# Patient Record
Sex: Female | Born: 1945
Health system: Southern US, Community
[De-identification: ages and names within clinical notes are randomized; demographics above are authoritative.]

## PROBLEM LIST (undated history)

## (undated) DIAGNOSIS — L719 Rosacea, unspecified: Secondary | ICD-10-CM

## (undated) DIAGNOSIS — M549 Dorsalgia, unspecified: Secondary | ICD-10-CM

## (undated) DIAGNOSIS — M7918 Myalgia, other site: Secondary | ICD-10-CM

## (undated) DIAGNOSIS — K589 Irritable bowel syndrome without diarrhea: Secondary | ICD-10-CM

## (undated) DIAGNOSIS — E785 Hyperlipidemia, unspecified: Secondary | ICD-10-CM

## (undated) DIAGNOSIS — G43909 Migraine, unspecified, not intractable, without status migrainosus: Secondary | ICD-10-CM

## (undated) DIAGNOSIS — E039 Hypothyroidism, unspecified: Secondary | ICD-10-CM

## (undated) DIAGNOSIS — Z01419 Encounter for gynecological examination (general) (routine) without abnormal findings: Secondary | ICD-10-CM

## (undated) DIAGNOSIS — F329 Major depressive disorder, single episode, unspecified: Secondary | ICD-10-CM

## (undated) DIAGNOSIS — F5104 Psychophysiologic insomnia: Secondary | ICD-10-CM

## (undated) DIAGNOSIS — N951 Menopausal and female climacteric states: Secondary | ICD-10-CM

## (undated) DIAGNOSIS — N3090 Cystitis, unspecified without hematuria: Secondary | ICD-10-CM

## (undated) DIAGNOSIS — F32A Depression, unspecified: Secondary | ICD-10-CM

## (undated) DIAGNOSIS — K635 Polyp of colon: Secondary | ICD-10-CM

## (undated) DIAGNOSIS — F988 Other specified behavioral and emotional disorders with onset usually occurring in childhood and adolescence: Secondary | ICD-10-CM

## (undated) HISTORY — DX: Dorsalgia, unspecified: M54.9

## (undated) HISTORY — DX: Irritable bowel syndrome, unspecified: K58.9

## (undated) HISTORY — DX: Depression, unspecified: F32.A

## (undated) HISTORY — DX: Rosacea, unspecified: L71.9

## (undated) HISTORY — DX: Hyperlipidemia, unspecified: E78.5

## (undated) HISTORY — PX: UPPER GASTROINTESTINAL ENDOSCOPY: SHX188

## (undated) HISTORY — DX: Menopausal and female climacteric states: N95.1

## (undated) HISTORY — DX: Encounter for gynecological examination (general) (routine) without abnormal findings: Z01.419

## (undated) HISTORY — DX: Hypothyroidism, unspecified: E03.9

## (undated) HISTORY — DX: Psychophysiologic insomnia: F51.04

## (undated) HISTORY — PX: APPENDECTOMY: SHX54

## (undated) HISTORY — DX: Polyp of colon: K63.5

## (undated) HISTORY — DX: Other specified behavioral and emotional disorders with onset usually occurring in childhood and adolescence: F98.8

## (undated) HISTORY — PX: TONSILLECTOMY: SUR1361

## (undated) HISTORY — PX: ABDOMINAL HYSTERECTOMY: SHX81

## (undated) HISTORY — DX: Migraine, unspecified, not intractable, without status migrainosus: G43.909

## (undated) HISTORY — DX: Cystitis, unspecified without hematuria: N30.90

## (undated) HISTORY — DX: Myalgia, other site: M79.18

## (undated) HISTORY — PX: OVARIAN CYST REMOVAL: SHX89

## (undated) HISTORY — DX: Major depressive disorder, single episode, unspecified: F32.9

---

## 1999-03-05 ENCOUNTER — Ambulatory Visit (HOSPITAL_COMMUNITY): Admission: RE | Admit: 1999-03-05 | Discharge: 1999-03-05 | Payer: Self-pay | Admitting: Family Medicine

## 1999-03-05 ENCOUNTER — Encounter: Payer: Self-pay | Admitting: Family Medicine

## 1999-10-27 ENCOUNTER — Other Ambulatory Visit: Admission: RE | Admit: 1999-10-27 | Discharge: 1999-10-27 | Payer: Self-pay | Admitting: Obstetrics and Gynecology

## 2000-11-17 ENCOUNTER — Other Ambulatory Visit: Admission: RE | Admit: 2000-11-17 | Discharge: 2000-11-17 | Payer: Self-pay | Admitting: Obstetrics and Gynecology

## 2001-02-01 ENCOUNTER — Encounter: Payer: Self-pay | Admitting: *Deleted

## 2001-02-01 ENCOUNTER — Encounter: Admission: RE | Admit: 2001-02-01 | Discharge: 2001-02-01 | Payer: Self-pay | Admitting: *Deleted

## 2001-04-04 ENCOUNTER — Inpatient Hospital Stay (HOSPITAL_COMMUNITY): Admission: EM | Admit: 2001-04-04 | Discharge: 2001-04-06 | Payer: Self-pay | Admitting: Emergency Medicine

## 2001-11-22 ENCOUNTER — Other Ambulatory Visit: Admission: RE | Admit: 2001-11-22 | Discharge: 2001-11-22 | Payer: Self-pay | Admitting: Obstetrics and Gynecology

## 2002-03-30 ENCOUNTER — Other Ambulatory Visit: Admission: RE | Admit: 2002-03-30 | Discharge: 2002-03-30 | Payer: Self-pay | Admitting: Obstetrics and Gynecology

## 2002-12-03 ENCOUNTER — Other Ambulatory Visit: Admission: RE | Admit: 2002-12-03 | Discharge: 2002-12-03 | Payer: Self-pay | Admitting: Obstetrics and Gynecology

## 2004-08-28 ENCOUNTER — Ambulatory Visit: Payer: Self-pay | Admitting: Family Medicine

## 2004-09-08 ENCOUNTER — Ambulatory Visit: Payer: Self-pay | Admitting: Family Medicine

## 2004-09-19 ENCOUNTER — Encounter: Admission: RE | Admit: 2004-09-19 | Discharge: 2004-09-19 | Payer: Self-pay | Admitting: Orthopaedic Surgery

## 2004-10-09 ENCOUNTER — Encounter: Admission: RE | Admit: 2004-10-09 | Discharge: 2004-10-09 | Payer: Self-pay | Admitting: Orthopaedic Surgery

## 2004-12-02 ENCOUNTER — Ambulatory Visit: Payer: Self-pay | Admitting: Family Medicine

## 2004-12-30 ENCOUNTER — Ambulatory Visit: Payer: Self-pay | Admitting: Family Medicine

## 2005-05-12 ENCOUNTER — Ambulatory Visit: Payer: Self-pay | Admitting: Family Medicine

## 2005-09-08 ENCOUNTER — Ambulatory Visit: Payer: Self-pay | Admitting: Family Medicine

## 2005-09-15 ENCOUNTER — Ambulatory Visit: Payer: Self-pay | Admitting: Family Medicine

## 2005-12-01 ENCOUNTER — Ambulatory Visit: Payer: Self-pay | Admitting: Family Medicine

## 2005-12-14 ENCOUNTER — Ambulatory Visit: Payer: Self-pay | Admitting: Family Medicine

## 2006-05-17 ENCOUNTER — Ambulatory Visit: Payer: Self-pay | Admitting: Family Medicine

## 2006-08-11 ENCOUNTER — Telehealth: Payer: Self-pay | Admitting: Family Medicine

## 2006-09-06 ENCOUNTER — Encounter: Payer: Self-pay | Admitting: Family Medicine

## 2006-09-15 ENCOUNTER — Ambulatory Visit: Payer: Self-pay | Admitting: Family Medicine

## 2006-09-15 DIAGNOSIS — R51 Headache: Secondary | ICD-10-CM

## 2006-09-15 LAB — CONVERTED CEMR LAB
Bilirubin Urine: NEGATIVE
Blood in Urine, dipstick: NEGATIVE
Protein, U semiquant: NEGATIVE
Urobilinogen, UA: 0.2
WBC Urine, dipstick: NEGATIVE

## 2006-09-22 ENCOUNTER — Ambulatory Visit: Payer: Self-pay | Admitting: Family Medicine

## 2006-09-22 DIAGNOSIS — N951 Menopausal and female climacteric states: Secondary | ICD-10-CM

## 2006-09-22 DIAGNOSIS — R197 Diarrhea, unspecified: Secondary | ICD-10-CM

## 2006-09-22 DIAGNOSIS — G4700 Insomnia, unspecified: Secondary | ICD-10-CM | POA: Insufficient documentation

## 2006-09-22 DIAGNOSIS — F329 Major depressive disorder, single episode, unspecified: Secondary | ICD-10-CM

## 2006-09-22 LAB — CONVERTED CEMR LAB
AST: 21 units/L (ref 0–37)
Albumin: 3.8 g/dL (ref 3.5–5.2)
BUN: 17 mg/dL (ref 6–23)
Basophils Absolute: 0 10*3/uL (ref 0.0–0.1)
CO2: 31 meq/L (ref 19–32)
Chloride: 107 meq/L (ref 96–112)
Cholesterol: 198 mg/dL (ref 0–200)
Eosinophils Absolute: 0.3 10*3/uL (ref 0.0–0.6)
Eosinophils Relative: 5.7 % — ABNORMAL HIGH (ref 0.0–5.0)
GFR calc non Af Amer: 77 mL/min
Glucose, Bld: 97 mg/dL (ref 70–99)
Hemoglobin: 14 g/dL (ref 12.0–15.0)
LDL Cholesterol: 95 mg/dL (ref 0–99)
Lymphocytes Relative: 40.7 % (ref 12.0–46.0)
MCHC: 34.6 g/dL (ref 30.0–36.0)
MCV: 95.8 fL (ref 78.0–100.0)
Monocytes Absolute: 0.5 10*3/uL (ref 0.2–0.7)
Neutro Abs: 2.4 10*3/uL (ref 1.4–7.7)
Platelets: 252 10*3/uL (ref 150–400)
Potassium: 4.6 meq/L (ref 3.5–5.1)
RBC: 4.23 M/uL (ref 3.87–5.11)
RDW: 12.4 % (ref 11.5–14.6)
TSH: 0.03 microintl units/mL — ABNORMAL LOW (ref 0.35–5.50)
Total Bilirubin: 0.8 mg/dL (ref 0.3–1.2)
Total Protein: 6.5 g/dL (ref 6.0–8.3)

## 2006-09-23 ENCOUNTER — Encounter: Payer: Self-pay | Admitting: Family Medicine

## 2006-09-26 ENCOUNTER — Encounter: Admission: RE | Admit: 2006-09-26 | Discharge: 2006-09-26 | Payer: Self-pay | Admitting: Gastroenterology

## 2006-09-27 ENCOUNTER — Telehealth: Payer: Self-pay | Admitting: Family Medicine

## 2006-10-10 ENCOUNTER — Encounter: Payer: Self-pay | Admitting: Family Medicine

## 2006-10-13 ENCOUNTER — Telehealth (INDEPENDENT_AMBULATORY_CARE_PROVIDER_SITE_OTHER): Payer: Self-pay | Admitting: *Deleted

## 2006-10-27 ENCOUNTER — Telehealth: Payer: Self-pay | Admitting: Family Medicine

## 2006-12-19 ENCOUNTER — Encounter: Admission: RE | Admit: 2006-12-19 | Discharge: 2006-12-19 | Payer: Self-pay | Admitting: Orthopaedic Surgery

## 2006-12-21 ENCOUNTER — Encounter: Payer: Self-pay | Admitting: Family Medicine

## 2007-01-03 ENCOUNTER — Telehealth: Payer: Self-pay | Admitting: Family Medicine

## 2007-03-02 ENCOUNTER — Telehealth: Payer: Self-pay | Admitting: Family Medicine

## 2007-04-04 ENCOUNTER — Telehealth: Payer: Self-pay | Admitting: Family Medicine

## 2007-04-19 LAB — CONVERTED CEMR LAB: Pap Smear: NORMAL

## 2007-04-19 LAB — HM COLONOSCOPY

## 2007-04-24 ENCOUNTER — Telehealth: Payer: Self-pay | Admitting: Family Medicine

## 2007-06-06 ENCOUNTER — Encounter: Admission: RE | Admit: 2007-06-06 | Discharge: 2007-06-06 | Payer: Self-pay | Admitting: Gastroenterology

## 2007-07-11 ENCOUNTER — Telehealth: Payer: Self-pay | Admitting: Family Medicine

## 2007-09-19 ENCOUNTER — Ambulatory Visit: Payer: Self-pay | Admitting: Family Medicine

## 2007-09-19 LAB — CONVERTED CEMR LAB
Glucose, Urine, Semiquant: NEGATIVE
Nitrite: NEGATIVE
Protein, U semiquant: NEGATIVE
Urobilinogen, UA: 0.2
WBC Urine, dipstick: NEGATIVE
pH: 6.5

## 2007-09-26 ENCOUNTER — Ambulatory Visit: Payer: Self-pay | Admitting: Family Medicine

## 2007-09-26 DIAGNOSIS — L719 Rosacea, unspecified: Secondary | ICD-10-CM

## 2007-09-26 DIAGNOSIS — J309 Allergic rhinitis, unspecified: Secondary | ICD-10-CM | POA: Insufficient documentation

## 2007-09-26 DIAGNOSIS — M810 Age-related osteoporosis without current pathological fracture: Secondary | ICD-10-CM | POA: Insufficient documentation

## 2007-09-26 DIAGNOSIS — G43909 Migraine, unspecified, not intractable, without status migrainosus: Secondary | ICD-10-CM | POA: Insufficient documentation

## 2007-09-26 LAB — CONVERTED CEMR LAB
AST: 19 units/L (ref 0–37)
Alkaline Phosphatase: 93 units/L (ref 39–117)
BUN: 12 mg/dL (ref 6–23)
Bilirubin, Direct: 0.1 mg/dL (ref 0.0–0.3)
Direct LDL: 102.7 mg/dL
Eosinophils Absolute: 0.1 10*3/uL (ref 0.0–0.7)
Eosinophils Relative: 2.1 % (ref 0.0–5.0)
GFR calc Af Amer: 93 mL/min
GFR calc non Af Amer: 77 mL/min
HCT: 41.2 % (ref 36.0–46.0)
HDL: 52.2 mg/dL (ref >39.0)
MCHC: 34.5 g/dL (ref 30.0–36.0)
Monocytes Absolute: 0.3 10*3/uL (ref 0.1–1.0)
Neutrophils Relative %: 40.8 % — ABNORMAL LOW (ref 43.0–77.0)
Platelets: 280 10*3/uL (ref 150–400)
Potassium: 4.6 meq/L (ref 3.5–5.1)
RBC: 4.26 M/uL (ref 3.87–5.11)
Sodium: 146 meq/L — ABNORMAL HIGH (ref 135–145)
Total Bilirubin: 0.7 mg/dL (ref 0.3–1.2)
Triglycerides: 109 mg/dL (ref 0–149)

## 2007-10-24 ENCOUNTER — Ambulatory Visit: Payer: Self-pay | Admitting: Family Medicine

## 2007-11-08 ENCOUNTER — Telehealth: Payer: Self-pay | Admitting: Family Medicine

## 2008-01-09 ENCOUNTER — Telehealth: Payer: Self-pay | Admitting: Family Medicine

## 2008-01-18 ENCOUNTER — Ambulatory Visit (HOSPITAL_BASED_OUTPATIENT_CLINIC_OR_DEPARTMENT_OTHER): Admission: RE | Admit: 2008-01-18 | Discharge: 2008-01-18 | Payer: Self-pay | Admitting: Orthopaedic Surgery

## 2008-04-16 ENCOUNTER — Ambulatory Visit: Payer: Self-pay | Admitting: Family Medicine

## 2008-04-16 DIAGNOSIS — M129 Arthropathy, unspecified: Secondary | ICD-10-CM | POA: Insufficient documentation

## 2008-04-29 ENCOUNTER — Encounter: Payer: Self-pay | Admitting: Family Medicine

## 2008-06-20 ENCOUNTER — Ambulatory Visit: Payer: Self-pay | Admitting: Family Medicine

## 2008-06-20 DIAGNOSIS — F411 Generalized anxiety disorder: Secondary | ICD-10-CM

## 2008-10-29 ENCOUNTER — Ambulatory Visit: Payer: Self-pay | Admitting: Family Medicine

## 2008-10-29 DIAGNOSIS — E039 Hypothyroidism, unspecified: Secondary | ICD-10-CM | POA: Insufficient documentation

## 2008-10-29 LAB — CONVERTED CEMR LAB
Glucose, Urine, Semiquant: NEGATIVE
Ketones, urine, test strip: NEGATIVE
Nitrite: NEGATIVE
Specific Gravity, Urine: 1.015
Urobilinogen, UA: 0.2
WBC Urine, dipstick: NEGATIVE
pH: 8.5

## 2008-11-12 LAB — CONVERTED CEMR LAB
AST: 18 units/L (ref 0–37)
Albumin: 4.1 g/dL (ref 3.5–5.2)
BUN: 19 mg/dL (ref 6–23)
CO2: 29 meq/L (ref 19–32)
Calcium: 9 mg/dL (ref 8.4–10.5)
Chloride: 102 meq/L (ref 96–112)
Eosinophils Absolute: 0.1 10*3/uL (ref 0.0–0.7)
Glucose, Bld: 85 mg/dL (ref 70–99)
HCT: 42.6 % (ref 36.0–46.0)
HDL: 66.3 mg/dL (ref >39.00)
Lymphs Abs: 2 10*3/uL (ref 0.7–4.0)
MCHC: 34 g/dL (ref 30.0–36.0)
MCV: 98.9 fL (ref 78.0–100.0)
Monocytes Relative: 7.4 % (ref 3.0–12.0)
Neutro Abs: 1.9 10*3/uL (ref 1.4–7.7)
Platelets: 204 10*3/uL (ref 150.0–400.0)
RBC: 4.31 M/uL (ref 3.87–5.11)
RDW: 12.5 % (ref 11.5–14.6)
Sodium: 142 meq/L (ref 135–145)

## 2008-11-13 ENCOUNTER — Telehealth: Payer: Self-pay | Admitting: Family Medicine

## 2009-01-29 ENCOUNTER — Telehealth: Payer: Self-pay | Admitting: Family Medicine

## 2009-02-26 ENCOUNTER — Telehealth: Payer: Self-pay | Admitting: Family Medicine

## 2009-04-29 ENCOUNTER — Telehealth: Payer: Self-pay | Admitting: Family Medicine

## 2009-05-01 ENCOUNTER — Ambulatory Visit: Payer: Self-pay | Admitting: Family Medicine

## 2009-08-05 ENCOUNTER — Telehealth: Payer: Self-pay | Admitting: Family Medicine

## 2009-10-10 ENCOUNTER — Encounter: Payer: Self-pay | Admitting: Family Medicine

## 2009-10-10 ENCOUNTER — Telehealth (INDEPENDENT_AMBULATORY_CARE_PROVIDER_SITE_OTHER): Payer: Self-pay | Admitting: *Deleted

## 2009-10-18 ENCOUNTER — Ambulatory Visit (HOSPITAL_BASED_OUTPATIENT_CLINIC_OR_DEPARTMENT_OTHER): Admission: RE | Admit: 2009-10-18 | Discharge: 2009-10-18 | Payer: Self-pay | Admitting: Rheumatology

## 2009-10-18 ENCOUNTER — Ambulatory Visit: Payer: Self-pay | Admitting: Diagnostic Radiology

## 2009-11-12 ENCOUNTER — Telehealth: Payer: Self-pay | Admitting: Family Medicine

## 2009-11-25 ENCOUNTER — Ambulatory Visit: Payer: Self-pay | Admitting: Family Medicine

## 2009-11-25 ENCOUNTER — Encounter: Payer: Self-pay | Admitting: Family Medicine

## 2009-11-25 DIAGNOSIS — K589 Irritable bowel syndrome without diarrhea: Secondary | ICD-10-CM

## 2009-11-25 DIAGNOSIS — E782 Mixed hyperlipidemia: Secondary | ICD-10-CM

## 2009-11-25 DIAGNOSIS — D72819 Decreased white blood cell count, unspecified: Secondary | ICD-10-CM | POA: Insufficient documentation

## 2009-11-26 LAB — CONVERTED CEMR LAB
AST: 19 units/L (ref 0–37)
Alkaline Phosphatase: 61 units/L (ref 39–117)
BUN: 18 mg/dL (ref 6–23)
CO2: 30 meq/L (ref 19–32)
Eosinophils Absolute: 0.1 10*3/uL (ref 0.0–0.7)
GFR calc non Af Amer: 82.11 mL/min (ref >60)
Hemoglobin: 13.7 g/dL (ref 12.0–15.0)
Monocytes Absolute: 0.3 10*3/uL (ref 0.1–1.0)
Neutrophils Relative %: 45.3 % (ref 43.0–77.0)
Potassium: 5.1 meq/L (ref 3.5–5.1)
RBC: 4.16 M/uL (ref 3.87–5.11)
RDW: 13.1 % (ref 11.5–14.6)
Sodium: 142 meq/L (ref 135–145)
Total Bilirubin: 0.5 mg/dL (ref 0.3–1.2)
Total CHOL/HDL Ratio: 4
VLDL: 30 mg/dL (ref 0.0–40.0)
WBC: 4.1 10*3/uL — ABNORMAL LOW (ref 4.5–10.5)

## 2009-11-28 ENCOUNTER — Encounter (HOSPITAL_COMMUNITY)
Admission: RE | Admit: 2009-11-28 | Discharge: 2010-01-17 | Payer: Self-pay | Source: Home / Self Care | Attending: Rheumatology | Admitting: Rheumatology

## 2010-02-17 NOTE — Progress Notes (Signed)
Summary: refill   Phone Note Refill Request Call back at Home Phone [phone] Message from:  Patient---live call  Refills Requested: Medication #1:  SONATA 10 MG  CAPS 2 tabs at bedtime send to [facility]. her cpx with Dr Abner Greenspan is next week. she is out of meds. please send today.  Initial call taken by: Warnell Forester,  November 12, 2009 1:39 PM  Follow-up for Phone Call        ok to send with same sig 180#, no rf Follow-up by: Danise Edge MD,  November 12, 2009 7:24 PM    Prescriptions: SONATA 10 MG  CAPS (ZALEPLON) 2 tabs at bedtime  #180 x 0   Entered by:   Josph Macho RMA   Authorized by:   Danise Edge MD   Signed by:   Josph Macho RMA on 11/13/2009   Method used:   Telephoned to ...       [facility]  [address] [id number] ([facility]ail)       [address]       [place], [place]  [address]       Ph: [phone] or [phone]       Fax: [phone]   RxID:   [id number]

## 2010-02-17 NOTE — Letter (Signed)
Summary: [facility]  [facility]   Imported By: Maryln Gottron 10/23/2009 14:03:29  _____________________________________________________________________  External Attachment:    Type:   Image     Comment:   External Document

## 2010-02-17 NOTE — Progress Notes (Signed)
Summary: new rx  GENERIC RITALIN   Phone Note Call from Patient Call back at Home Phone [phone]   Caller: Patient Call For: dr Scotty Court Summary of Call: pt would like rx for generic  ritalin 20 mg one tablet three a day. please call when ready Initial call taken by: Heron Sabins,  January 09, 2008 12:55 PM  Follow-up for Phone Call        OK  CAN PICK UP TOMORROW BEFORE 12 NOON  Follow-up by: Pura Spice, RN,  January 10, 2008 10:24 AM    New/Updated Medications: METHYLPHENIDATE HCL 20 MG  TABS (METHYLPHENIDATE HCL) TAKE 1 by mouth three times a day  FILL ON JAN 2,2010 METHYLPHENIDATE HCL 20 MG  TABS (METHYLPHENIDATE HCL) 1 by mouth three times a day fill on Feb 20 2008 METHYLPHENIDATE HCL 20 MG  TABS (METHYLPHENIDATE HCL) 1 by mouth three times a day fill March 19, 2008   Prescriptions: METHYLPHENIDATE HCL 20 MG  TABS (METHYLPHENIDATE HCL) 1 by mouth three times a day fill March 19, 2008  #90 x 0   Entered by:   Pura Spice, RN   Authorized by:   Judithann Sheen MD   Signed by:   Pura Spice, RN on 01/10/2008   Method used:   Print then Give to Patient   RxID:   [id number] METHYLPHENIDATE HCL 20 MG  TABS (METHYLPHENIDATE HCL) 1 by mouth three times a day fill on Feb 20 2008  #90 x 0   Entered by:   Pura Spice, RN   Authorized by:   Judithann Sheen MD   Signed by:   Pura Spice, RN on 01/10/2008   Method used:   Print then Give to Patient   RxID:   [id number] METHYLPHENIDATE HCL 20 MG  TABS (METHYLPHENIDATE HCL) TAKE 1 by mouth three times a day  FILL ON JAN 2,2010  #90 x 0   Entered by:   Pura Spice, RN   Authorized by:   Judithann Sheen MD   Signed by:   Pura Spice, RN on 01/10/2008   Method used:   Print then Give to Patient   RxID:   [id number]

## 2010-02-17 NOTE — Progress Notes (Signed)
Summary: REFILL REQUEST  Phone Note Refill Request Message from:  Patient on October 10, 2009 10:57 AM  Refills Requested: Medication #1:  METHYLPHENIDATE HCL 20 MG  TABS TAKE 1 by mouth three times a day  FILL ON  july20   Notes: Pt can be reached at [phone] when Rx is ready for p/u.    Initial call taken by: Debbra Riding,  October 10, 2009 10:58 AM  Follow-up for Phone Call        Pt had RX wrote for dispense on July 20, Aug 20, and Sept 20. Pt was informed to call back next month. Follow-up by: Josph Macho RMA,  October 10, 2009 11:07 AM

## 2010-02-17 NOTE — Progress Notes (Signed)
Summary: new rx and referral  Phone Note Call from Patient Call back at Home Phone [phone]   Caller: Patient Call For: Judithann Sheen MD Summary of Call: pt would like referral to rheumatologist dr zieminski or dr deveshwar for ?fibromyalgia also new rx ritalin 20mg  for 3 months and separate rx for generic allergra 180 #100 with 3 refills pt will get rx fill in [place] Initial call taken by: Heron Sabins,  August 05, 2009 12:27 PM  Follow-up for Phone Call        ok can pick up rx today and [facility] will call when they get referrral completed,  Follow-up by: Pura Spice, RN,  August 06, 2009 8:30 AM    New/Updated Medications: ALLEGRA 180 MG  TABS (FEXOFENADINE HCL) once daily METHYLPHENIDATE HCL 20 MG  TABS (METHYLPHENIDATE HCL) TAKE 1 by mouth three times a day  FILL ON  july20, 2011 METHYLPHENIDATE HCL 20 MG  TABS (METHYLPHENIDATE HCL) 1 by mouth three times a day fill on    Sep 06 2009 METHYLPHENIDATE HCL 20 MG  TABS (METHYLPHENIDATE HCL) 1 by mouth three times a day fill sept 20 2011 Prescriptions: METHYLPHENIDATE HCL 20 MG  TABS (METHYLPHENIDATE HCL) 1 by mouth three times a day fill sept 20 2011  #90 x 0   Entered by:   Pura Spice, RN   Authorized by:   Judithann Sheen MD   Signed by:   Pura Spice, RN on 08/06/2009   Method used:   Print then Give to Patient   RxID:   [id number] METHYLPHENIDATE HCL 20 MG  TABS (METHYLPHENIDATE HCL) 1 by mouth three times a day fill on    Sep 06 2009  #90 x 0   Entered by:   Pura Spice, RN   Authorized by:   Judithann Sheen MD   Signed by:   Pura Spice, RN on 08/06/2009   Method used:   Print then Give to Patient   RxID:   [id number] METHYLPHENIDATE HCL 20 MG  TABS (METHYLPHENIDATE HCL) TAKE 1 by mouth three times a day  FILL ON  july20, 2011  #90 x 0   Entered by:   Pura Spice, RN   Authorized by:   Judithann Sheen MD   Signed by:   Pura Spice, RN on 08/06/2009   Method used:    Print then Give to Patient   RxID:   [id number] ALLEGRA 180 MG  TABS (FEXOFENADINE HCL) once daily  #100 x 3   Entered by:   Pura Spice, RN   Authorized by:   Judithann Sheen MD   Signed by:   Pura Spice, RN on 08/06/2009   Method used:   Print then Give to Patient   RxID:   [id number]

## 2010-02-17 NOTE — Assessment & Plan Note (Signed)
Summary: MED CK (REFILL) // RS---PT REQ APPT AFTER LUNCH // RS   Vital Signs:  Patient profile:   65 year old female Weight:      124 pounds O2 Sat:      97 % Temp:     97.8 degrees F Pulse rate:   88 / minute BP sitting:   110 / 74  (left arm)  Vitals Entered By: Pura Spice, RN (May 01, 2009 1:21 PM) CC: 6 month follow up and refills    History of Present Illness: pT IN TO DISCUSS ALL ASPECTS OF DEPRESSION AND HER TX. fOUND RITALIN IS EXCELLANT ANTIDEPRESSANT FOR HER, DOING BETTER THEN EVER STOPPED ESTROGENS AND BECAME MORE DEPRESSED AND FATIQUED, RESTARTED AND FUNCTIONED MUCH BETTER, vAGIFEN AND ESTRADIOL HELPS ATROPHIC VAGINITIS TREMENDOUSLY SONATO AND LOPRRESSOR CONTROLS HER CHRONIC INSOMNIA MIGRAINES ESSENTIALLY SAME, CONTINUES TO SEE dR FRIEDMAN, NEUROLOGIST  Allergies: 1)  ! Keflex  Past History:  Past Medical History: Last updated: 10/29/2008 IBS Back pain Headache,migraine Chronic insomnia Recurrent cystitis after intercourse, controlled with Macrobid  Review of Systems  The patient denies anorexia, fever, weight loss, weight gain, vision loss, decreased hearing, hoarseness, chest pain, syncope, dyspnea on exertion, peripheral edema, prolonged cough, headaches, hemoptysis, abdominal pain, melena, hematochezia, severe indigestion/heartburn, hematuria, incontinence, genital sores, muscle weakness, suspicious skin lesions, transient blindness, difficulty walking, depression, unusual weight change, abnormal bleeding, enlarged lymph nodes, angioedema, breast masses, and testicular masses.    Physical Exam  General:  Well-developed,well-nourished,in no acute distress; alert,appropriate and cooperative throughout examination Lungs:  Normal respiratory effort, chest expands symmetrically. Lungs are clear to auscultation, no crackles or wheezes. Heart:  Normal rate and regular rhythm. S1 and S2 normal without gallop, murmur, click, rub or other extra sounds. Abdomen:   Bowel sounds positive,abdomen soft and non-tender without masses, organomegaly or hernias noted. Msk:  No deformity or scoliosis noted of thoracic or lumbar spine.     Impression & Recommendations:  Problem # 1:  ANXIETY (ICD-300.00) Assessment Improved  The following medications were removed from the medication list:    Effexor 37.5 Mg Tabs (Venlafaxine hcl) .Marland KitchenMarland KitchenMarland KitchenMarland Kitchen 3 tabs once daily Her updated medication list for this problem includes:    Lorazepam 2 Mg Tabs (Lorazepam) .Marland Kitchen... 1 three times a day as needed stress    Zoloft 100 Mg Tabs (Sertraline hcl) .Marland Kitchen... 1/2 tab daily  Problem # 2:  MIGRAINE, CHRONIC (ICD-346.90) Assessment: Unchanged  Her updated medication list for this problem includes:    Maxalt-mlt 10 Mg Tbdp (Rizatriptan benzoate) .Marland Kitchen... As needed. maximium 4 per week dr Neale Burly    Ecotrin Low Strength 81 Mg Tbec (Aspirin) ..... Once daily    Celebrex 200 Mg Caps (Celecoxib) .Marland Kitchen..Marland Kitchen Two times a day  Problem # 3:  DEPRESSION, HX OF (ICD-V11.8) Assessment: Improved discontined effexor and taking ritalin as antidepressant, unable to take SSRI  Problem # 4:  INSOMNIA, CHRONIC (ICD-307.42) Assessment: Improved sonATO AND LORAZEPAM  Problem # 5:  POSTMENOPAUSAL STATUS (ICD-627.2) Assessment: Improved  The following medications were removed from the medication list:    Vivelle-dot 0.05 Mg/24hr Pttw (Estradiol) .Marland Kitchen... 2xweek [facility] Her updated medication list for this problem includes:    Vagifem 25 Mcg Tabs (Estradiol) .Marland Kitchen... 2 x weekly    Estradiol 0.5 Mg Tabs (Estradiol) ..... Once daily ATTEMPTED TO STOP ESTROGENS AND DID HORRIBLY  Complete Medication List: 1)  Synthroid 137 Mcg Tabs (Levothyroxine sodium) .... Once daily dr doerr 2)  Sonata 10 Mg Caps (Zaleplon) .... 2  tabs at bedtime 3)  Melatonin 3 Mg Caps (Melatonin) .... 2 at bedtime 4)  Allegra 180 Mg Tabs (Fexofenadine hcl) .... Once daily 5)  Maxalt-mlt 10 Mg Tbdp (Rizatriptan benzoate) .... As needed.  maximium 4 per week dr Neale Burly 6)  Ecotrin Low Strength 81 Mg Tbec (Aspirin) .... Once daily 7)  Vagifem 25 Mcg Tabs (Estradiol) .... 2 x weekly 8)  Celebrex 200 Mg Caps (Celecoxib) .... Two times a day 9)  Macrobid 100 Mg Caps (Nitrofurantoin monohyd macro) .... Two times a day 10)  Metronidazole 0.75 % Crea (Metronidazole) .... Apply to face two times a day 11)  Methylphenidate Hcl 20 Mg Tabs (Methylphenidate hcl) .... Take 1 by mouth three times a day  fill on April 29, 2009 12)  Methylphenidate Hcl 20 Mg Tabs (Methylphenidate hcl) .Marland Kitchen.. 1 by mouth three times a day fill on    May 29, 2009 13)  Methylphenidate Hcl 20 Mg Tabs (Methylphenidate hcl) .Marland Kitchen.. 1 by mouth three times a day fill  june 12,2011 14)  Lorazepam 2 Mg Tabs (Lorazepam) .Marland Kitchen.. 1 three times a day as needed stress 15)  Topamax 50 Mg Tabs (Topiramate) .... 3 tabs  once daily per dr. Neale Burly 16)  Perphenazine 4 Mg Tabs (Perphenazine) .... Dr Neale Burly 17)  Zoloft 100 Mg Tabs (Sertraline hcl) .... 1/2 tab daily 18)  Nystatin 500000 Unit Tabs (Nystatin) .Marland Kitchen.. 1 two times a day for yeast 19)  Estradiol 0.5 Mg Tabs (Estradiol) .... Once daily  Patient Instructions: 1)  discussed depression and tx, uses ritalin as antidepressan 2)  refilled medications 3)  return after Sept 12  for exam Prescriptions: METHYLPHENIDATE HCL 20 MG  TABS (METHYLPHENIDATE HCL) 1 by mouth three times a day fill  june 12,2011  #90 x 0   Entered and Authorized by:   Judithann Sheen MD   Signed by:   Judithann Sheen MD on 05/01/2009   Method used:   Print then Give to Patient   RxID:   [id number] METHYLPHENIDATE HCL 20 MG  TABS (METHYLPHENIDATE HCL) 1 by mouth three times a day fill on    May 29, 2009  #90 x 0   Entered and Authorized by:   Judithann Sheen MD   Signed by:   Judithann Sheen MD on 05/01/2009   Method used:   Print then Give to Patient   RxID:   [id number] METHYLPHENIDATE HCL 20 MG  TABS (METHYLPHENIDATE HCL)  TAKE 1 by mouth three times a day  FILL ON April 29, 2009  #90 x 0   Entered and Authorized by:   Judithann Sheen MD   Signed by:   Judithann Sheen MD on 05/01/2009   Method used:   Print then Give to Patient   RxID:   [id number] MACROBID 100 MG  CAPS (NITROFURANTOIN MONOHYD MACRO) two times a day  #30 x 11   Entered and Authorized by:   Judithann Sheen MD   Signed by:   Judithann Sheen MD on 05/01/2009   Method used:   Print then Give to Patient   RxID:   [id number] LORAZEPAM 2 MG TABS (LORAZEPAM) 1 three times a day as needed stress  #90 x 5   Entered and Authorized by:   Judithann Sheen MD   Signed by:   Judithann Sheen MD on 05/01/2009   Method used:   Print then Give  to Patient   RxID:   [id number] SONATA 10 MG  CAPS (ZALEPLON) 2 tabs at bedtime  #180 x 5   Entered and Authorized by:   Judithann Sheen MD   Signed by:   Judithann Sheen MD on 05/01/2009   Method used:   Print then Give to Patient   RxID:   [id number]

## 2010-02-17 NOTE — Progress Notes (Signed)
Summary: med refill  Phone Note Call from Patient   Caller: Patient Call For: Judithann Sheen MD Summary of Call: Pt needs written pres for Fexofenadine 180 mg. #90 plus 3 refills, and will pick up.  Would like this today.  Call when ready. [phone] Initial call taken by: Lynann Beaver CMA,  February 26, 2009 11:27 AM  Follow-up for Phone Call        tell her her rx for fexofenadine was given to her on oct  12 for 90 and 3 refills and this will last til Oct 29 2009 ?? thanks  Follow-up by: Pura Spice, RN,  February 26, 2009 12:54 PM  Additional Follow-up for Phone Call Additional follow up Details #1::        she is changing pharmacies, and needs new one. Additional Follow-up by: Lynann Beaver CMA,  February 26, 2009 1:01 PM    Additional Follow-up for Phone Call Additional follow up Details #2::    pt will contact pharmacy since has refills and will have her refills transferred to new company.  Follow-up by: Pura Spice, RN,  February 26, 2009 1:28 PM

## 2010-02-17 NOTE — Assessment & Plan Note (Signed)
Summary: CPX (PT WILL COME IN FASTING) // RS---PT RSC (BMP) // RS/pt r...   Vital Signs:  Patient profile:   65 year old female Height:      64 inches (162.56 cm) Weight:      126 pounds (57.27 kg) BMI:     21.71 O2 Sat:      99 % on Room air Temp:     97.7 degrees F (36.50 degrees C) oral Pulse rate:   105 / minute BP sitting:   116 / 70  (left arm)  Vitals Entered By: Josph Macho RMA (November 25, 2009 9:15 AM)  O2 Flow:  Room air CC: Physical/ Medicare welcome/ CF Is Patient Diabetic? No   History of Present Illness: 65 year old Caucasian female in today for annual Medicare exam generally doing well but does offer some concern she reports she has been struggling with left shoulder pain since last May. She reports she was sitting for 2 days working puzzle and ended up with a spasm in her left posterior shoulder and since then has been in chronic pain has seen orthopedics multiple studies done is using Lidoderm patches and gets very minimal relief. Chest is a complete bone scan ordered for the near future for more thorough evaluation due to level of pain. At this point she's also developed frozen shoulder unable to raise her arm above her head. Denies radicular symptoms down her arm, no numbness tingling or weakness. She denies any falls or traumas. She reports she is getting around her home well her activities of daily living are fine other than her limitations with her shoulder. She does not have any concerns about her home environment. She pays her bills regularly is able to cook and clean for her husband and herself still is not having trouble getting lost. No recent changes in her vision but does note some mild recent decrease in her hearing on the left worse than right. Denies tinnitus and says this loss is very minimal and does not affect her daily living she continues to show with a long history of depression anxiety and insomnia and reports Ritalin 3 times a day to help her energy  level during the day has been very helpful and she feels improved her mood and the Sonata at bedtime with the lorazepam helps her to sleep. She reports 2 siblings a brother and a sister both trouble with low serotonin levels that she does and so far her current regimen is normal and she's found that helpful. She was stripping with severe and chronic migraines now sees the headache clinic and has had good control on her current medications. She denies fevers, chills, chest pain, GI or GU complaints. Spelled world backward easily is going to person place and time today does not have a DNR order  Preventive Screening-Counseling & Management  Alcohol-Tobacco     Smoking Status: never      Drug Use:  no.    Problems Prior to Update: 1)  Leukopenia, Mild  (ICD-288.50) 2)  Mixed Hyperlipidemia  (ICD-272.2) 3)  Irritable Bowel Syndrome  (ICD-564.1) 4)  Hypothyroidism  (ICD-244.9) 5)  Uti  (ICD-599.0) 6)  Anxiety  (ICD-300.00) 7)  Arthritis  (ICD-716.90) 8)  Allergic Rhinitis  (ICD-477.9) 9)  Rosacea  (ICD-695.3) 10)  Migraine, Chronic  (ICD-346.90) 11)  Osteoporosis  (ICD-733.00) 12)  Depression, Hx of  (ICD-V11.8) 13)  Insomnia, Chronic  (ICD-307.42) 14)  Diarrhea, Acute, Chronic  (ICD-787.91) 15)  Examination, Routine Medical  (ICD-V70.0) 16)  Urinary Tract Infection, Chronic  (ICD-599.0) 17)  Postmenopausal Status  (ICD-627.2) 18)  Headache  (ICD-784.0)  Current Problems (verified): 1)  Leukopenia, Mild  (ICD-288.50) 2)  Mixed Hyperlipidemia  (ICD-272.2) 3)  Irritable Bowel Syndrome  (ICD-564.1) 4)  Hypothyroidism  (ICD-244.9) 5)  Uti  (ICD-599.0) 6)  Anxiety  (ICD-300.00) 7)  Arthritis  (ICD-716.90) 8)  Allergic Rhinitis  (ICD-477.9) 9)  Rosacea  (ICD-695.3) 10)  Migraine, Chronic  (ICD-346.90) 11)  Osteoporosis  (ICD-733.00) 12)  Depression, Hx of  (ICD-V11.8) 13)  Insomnia, Chronic  (ICD-307.42) 14)  Diarrhea, Acute, Chronic  (ICD-787.91) 15)  Examination, Routine Medical   (ICD-V70.0) 16)  Urinary Tract Infection, Chronic  (ICD-599.0) 17)  Postmenopausal Status  (ICD-627.2) 18)  Headache  (ICD-784.0)  Current Medications (verified): 1)  Synthroid 100 Mcg Tabs (Levothyroxine Sodium) .... Once Daily Dr Roanna Raider 2)  Sonata 10 Mg  Caps (Zaleplon) .... 2 Tabs At Bedtime 3)  Melatonin 3 Mg  Caps (Melatonin) .... 3 At Bedtime 4)  Allegra 180 Mg  Tabs (Fexofenadine Hcl) .... Once Daily 5)  Maxalt-Mlt 10 Mg  Tbdp (Rizatriptan Benzoate) .... As Needed. Maximium 4 Per Week Dr Neale Burly 6)  Ecotrin Low Strength 81 Mg  Tbec (Aspirin) .... Once Daily 7)  Vagifem 25 Mcg  Tabs (Estradiol) .... 2 X Weekly 8)  Celebrex 200 Mg  Caps (Celecoxib) .... Two Times A Day 9)  Macrobid 100 Mg  Caps (Nitrofurantoin Monohyd Macro) .... Two Times A Day 10)  Metronidazole 0.75 %  Crea (Metronidazole) .... Apply To Face Two Times A Day 11)  Methylphenidate Hcl 20 Mg  Tabs (Methylphenidate Hcl) .... Take 1 By Mouth Three Times A Day  Fill On  July20, 2011 12)  Methylphenidate Hcl 20 Mg  Tabs (Methylphenidate Hcl) .Marland Kitchen.. 1 By Mouth Three Times A Day Fill On    Sep 06 2009 13)  Methylphenidate Hcl 20 Mg  Tabs (Methylphenidate Hcl) .Marland Kitchen.. 1 By Mouth Three Times A Day Fill Sept 20 2011 14)  Lorazepam 2 Mg Tabs (Lorazepam) .... At Bedtime 15)  Topamax 50 Mg Tabs (Topiramate) .... 3 Tabs  Once Daily Per Dr. Neale Burly 16)  Perphenazine 4 Mg Tabs (Perphenazine) .... Dr Neale Burly 17)  Zoloft 100 Mg Tabs (Sertraline Hcl) .... 1/2 Tab Daily 18)  Estradiol 0.5 Mg Tabs (Estradiol) .... Once Daily 19)  Reclast 5 Mg/165ml Soln (Zoledronic Acid) .... Once Yearly (July)  Allergies (verified): 1)  ! Keflex  Past History:  Past Surgical History: Hysterectomy, partial for prolapse Ovarian cyst excised on left Tonsillectomy Appendectomy  Family History: brother and Sister both with low Serotonin levels Sister precancerous colon polyp.  PUncle deceased from Colon cancer around 59 MCousin recovering from Colon  Cancer: 29, diagnosed in late 35s  Social History: Retired Environmental health practitioner Married Never Smoked Alcohol use-no Drug use-no Dietary restrictions, avoid seeds Smoking Status:  never Drug Use:  no  Review of Systems  The patient denies anorexia, fever, weight loss, weight gain, vision loss, decreased hearing, hoarseness, chest pain, syncope, dyspnea on exertion, peripheral edema, prolonged cough, headaches, hemoptysis, abdominal pain, melena, hematochezia, severe indigestion/heartburn, hematuria, incontinence, muscle weakness, suspicious skin lesions, transient blindness, difficulty walking, depression, unusual weight change, abnormal bleeding, enlarged lymph nodes, angioedema, and breast masses.    Physical Exam  General:  Well-developed,well-nourished,in no acute distress; alert,appropriate and cooperative throughout examination Head:  Normocephalic and atraumatic without obvious abnormalities. No apparent alopecia or balding. Eyes:  No corneal or conjunctival inflammation noted. EOMI. Perrla. Funduscopic exam  benign, without hemorrhages, exudates or papilledema. Vision grossly normal. Ears:  External ear exam shows no significant lesions or deformities.  Otoscopic examination reveals clear canals, tympanic membranes are intact bilaterally without bulging, retraction, inflammation or discharge. Hearing is grossly normal bilaterally. Nose:  External nasal examination shows no deformity or inflammation. Nasal mucosa are pink and moist without lesions or exudates. Mouth:  Oral mucosa and oropharynx without lesions or exudates.  Neck:  No deformities, masses, or tenderness noted. Lungs:  Normal respiratory effort, chest expands symmetrically. Lungs are clear to auscultation, no crackles or wheezes. Heart:  Normal rate and regular rhythm. S1 and S2 normal without gallop, murmur, click, rub or other extra sounds. Abdomen:  Bowel sounds positive,abdomen soft and non-tender without masses,  organomegaly or hernias noted. Msk:  No deformity or scoliosis noted of thoracic or lumbar spine.   Pulses:  R and L carotid,radial,femoral,dorsalis pedis and posterior tibial pulses are full and equal bilaterally Extremities:  No clubbing, cyanosis, edema, or deformity noted  Neurologic:  No cranial nerve deficits noted. Station and gait are normal. Plantar reflexes are down-going bilaterally. DTRs are symmetrical throughout. Sensory, motor and coordinative functions appear intact. Skin:  Intact without suspicious lesions or rashes Cervical Nodes:  No lymphadenopathy noted Psych:  Cognition and judgment appear intact. Alert and cooperative with normal attention span and concentration. No apparent delusions, illusions, hallucinations   Impression & Recommendations:  Problem # 1:  Preventive Health Care (ICD-V70.0) annual medicare wellness exam completed, refills given and patient education offered. Patient reports last MGM was this year, pelvic was this year and last pap was last year. Last colonoscopy was 2 years ago, due to family history may consider repeat in 3 years.  Problem # 2:  MIXED HYPERLIPIDEMIA (ICD-272.2)  Orders: TLB-Lipid Panel (80061-LIPID) Venipuncture (16109) Specimen Handling (60454) Medicare -1st Annual Wellness Visit [phone]) Avoid trans fats  Problem # 3:  IRRITABLE BOWEL SYNDROME (ICD-564.1) Add Align caps daily and Benefiber powder  Problem # 4:  LEUKOPENIA, MILD (ICD-288.50)  Check a CBC  Orders: Medicare -1st Annual Wellness Visit [phone])  Problem # 5:  HYPOTHYROIDISM (ICD-244.9)  Her updated medication list for this problem includes:    Synthroid 100 Mcg Tabs (Levothyroxine sodium) ..... Once daily dr doerr Thomes Dinning closely with Endocrine for this, it has been very labile  Orders: Medicare -1st Annual Wellness Visit [phone])  Problem # 6:  INSOMNIA, CHRONIC (ICD-307.42)  Orders: Medicare -1st Annual Wellness Visit [phone]) Given Sonata refill  today   Complete Medication List: 1)  Synthroid 100 Mcg Tabs (Levothyroxine sodium) .... Once daily dr doerr 2)  Sonata 10 Mg Caps (Zaleplon) .... 2 tabs at bedtime 3)  Melatonin 3 Mg Caps (Melatonin) .... 3 at bedtime 4)  Allegra 180 Mg Tabs (Fexofenadine hcl) .... Once daily 5)  Maxalt-mlt 10 Mg Tbdp (Rizatriptan benzoate) .... As needed. maximium 4 per week dr Neale Burly 6)  Ecotrin Low Strength 81 Mg Tbec (Aspirin) .... Once daily 7)  Vagifem 25 Mcg Tabs (Estradiol) .... 2 x weekly 8)  Celebrex 200 Mg Caps (Celecoxib) .Marland Kitchen.. 1 tab by mouth two times a day as needed pain as with food 9)  Macrobid 100 Mg Caps (Nitrofurantoin monohyd macro) .... Two times a day 10)  Metronidazole 0.75 % Crea (Metronidazole) .... Apply to face two times a day 11)  Methylphenidate Hcl 20 Mg Tabs (Methylphenidate hcl) .... Take 1 by mouth three times a day 12)  Methylphenidate Hcl 20 Mg Tabs (Methylphenidate hcl) .Marland Kitchen.. 1 by  mouth three times a day fill on   November 25, 2009 13)  Methylphenidate Hcl 20 Mg Tabs (Methylphenidate hcl) .Marland Kitchen.. 1 by mouth three times a day fill December 25, 2009 14)  Lorazepam 2 Mg Tabs (Lorazepam) .Marland Kitchen.. 1 tab by mouth three times a day prna anxiety and insomnia 15)  Topamax 50 Mg Tabs (Topiramate) .... 3 tabs  once daily per dr. Neale Burly 16)  Perphenazine 4 Mg Tabs (Perphenazine) .... Dr Neale Burly 17)  Zoloft 100 Mg Tabs (Sertraline hcl) .Marland Kitchen.. 1 tab by mouth daily 18)  Estradiol 0.5 Mg Tabs (Estradiol) .... Once daily 19)  Reclast 5 Mg/136ml Soln (Zoledronic acid) .... Once yearly (july)  Other Orders: TLB-Renal Function Panel (80069-RENAL) TLB-CBC Platelet - w/Differential (85025-CBCD) TLB-Hepatic/Liver Function Pnl (80076-HEPATIC)  Patient Instructions: 1)  Consider probiotic such as Align capsules 1 daily 2)  Add Benefiber 2 tsp one to twice daily for loose or constipated stool. 3)  Continue yogurt daily 4)  Recommend Celebrex daily and increase to two times a day if inadequate  relief 5)  Continue Lidoderm patch 6)  Try 1/2 Flexeril and 1 tab daily 7)  Moist heat and gentle stretching daily 8)  Need colonoscopy in 3 years likely due to family history. 9)  Continue with annual mgm and biannual paps with gynecologist Prescriptions: CELEBREX 200 MG  CAPS (CELECOXIB) 1 tab by mouth two times a day as needed pain as with food  #180 x 1   Entered and Authorized by:   Danise Edge MD   Signed by:   Danise Edge MD on 11/25/2009   Method used:   Print then Give to Patient   RxID:   [id number] METHYLPHENIDATE HCL 20 MG  TABS (METHYLPHENIDATE HCL) 1 by mouth three times a day fill on   November 25, 2009  #90 x 0   Entered and Authorized by:   Danise Edge MD   Signed by:   Danise Edge MD on 11/25/2009   Method used:   Print then Give to Patient   RxID:   [id number] METHYLPHENIDATE HCL 20 MG  TABS (METHYLPHENIDATE HCL) 1 by mouth three times a day fill December 25, 2009  #90 x 0   Entered and Authorized by:   Danise Edge MD   Signed by:   Danise Edge MD on 11/25/2009   Method used:   Print then Give to Patient   RxID:   [id number] METHYLPHENIDATE HCL 20 MG  TABS (METHYLPHENIDATE HCL) 1 by mouth three times a day fill January 06, 2010  #90 x 0   Entered and Authorized by:   Danise Edge MD   Signed by:   Danise Edge MD on 11/25/2009   Method used:   Print then Give to Patient   RxID:   [id number] ZOLOFT 100 MG TABS (SERTRALINE HCL) 1 tab by mouth daily  #90 x 1   Entered and Authorized by:   Danise Edge MD   Signed by:   Danise Edge MD on 11/25/2009   Method used:   Print then Give to Patient   RxID:   [id number] METHYLPHENIDATE HCL 20 MG  TABS (METHYLPHENIDATE HCL) TAKE 1 by mouth three times a day  #90 x 0   Entered and Authorized by:   Danise Edge MD   Signed by:   Danise Edge MD on 11/25/2009   Method used:   Print then Give to Patient   RxID:   [id number] ALLEGRA 180 MG  TABS (FEXOFENADINE HCL) once daily  #  90 x 1    Entered and Authorized by:   Danise Edge MD   Signed by:   Danise Edge MD on 11/25/2009   Method used:   Print then Give to Patient   RxID:   [id number] LORAZEPAM 2 MG TABS (LORAZEPAM) 1 tab by mouth three times a day prna anxiety and insomnia  #90 x 0   Entered and Authorized by:   Danise Edge MD   Signed by:   Danise Edge MD on 11/25/2009   Method used:   Print then Give to Patient   RxID:   [id number] SONATA 10 MG  CAPS (ZALEPLON) 2 tabs at bedtime  #180 x 0   Entered and Authorized by:   Danise Edge MD   Signed by:   Danise Edge MD on 11/25/2009   Method used:   Print then Give to Patient   RxID:   [id number]    Orders Added: 1)  TLB-Renal Function Panel [80069-RENAL] 2)  TLB-CBC Platelet - w/Differential [85025-CBCD] 3)  TLB-Hepatic/Liver Function Pnl [80076-HEPATIC] 4)  TLB-Lipid Panel [80061-LIPID] 5)  Venipuncture [01027] 6)  Specimen Handling [99000] 7)  Medicare -1st Annual Wellness Visit [G0438]    Preventive Care Screening  Last Flu Shot:    Date:  10/18/2009    Results:  hhistorical    Appended Document: CPX (PT WILL COME IN FASTING) // RS---PT RSC (BMP) // RS/pt r...     History of Present Illness: Patient sees multiple other physicians  [facility] Dr Allena Katz of Endocrinology at [facility] Dr Corliss Skains of Rheumatology at [facility] Dr Ernestina Penna of Gynecology  Allergies: 1)  ! Keflex   Complete Medication List: 1)  Synthroid 100 Mcg Tabs (Levothyroxine sodium) .... Once daily dr doerr 2)  Sonata 10 Mg Caps (Zaleplon) .... 2 tabs at bedtime 3)  Melatonin 3 Mg Caps (Melatonin) .... 3 at bedtime 4)  Allegra 180 Mg Tabs (Fexofenadine hcl) .... Once daily 5)  Maxalt-mlt 10 Mg Tbdp (Rizatriptan benzoate) .... As needed. maximium 4 per week dr Neale Burly 6)  Ecotrin Low Strength 81 Mg Tbec (Aspirin) .... Once daily 7)  Vagifem 25 Mcg Tabs (Estradiol) .... 2 x weekly 8)  Celebrex 200 Mg Caps (Celecoxib) .Marland Kitchen.. 1 tab  by mouth two times a day as needed pain as with food 9)  Macrobid 100 Mg Caps (Nitrofurantoin monohyd macro) .... Two times a day 10)  Metronidazole 0.75 % Crea (Metronidazole) .... Apply to face two times a day 11)  Methylphenidate Hcl 20 Mg Tabs (Methylphenidate hcl) .... Take 1 by mouth three times a day 12)  Methylphenidate Hcl 20 Mg Tabs (Methylphenidate hcl) .Marland Kitchen.. 1 by mouth three times a day fill on   November 25, 2009 13)  Methylphenidate Hcl 20 Mg Tabs (Methylphenidate hcl) .Marland Kitchen.. 1 by mouth three times a day fill December 25, 2009 14)  Lorazepam 2 Mg Tabs (Lorazepam) .Marland Kitchen.. 1 tab by mouth three times a day prna anxiety and insomnia 15)  Topamax 50 Mg Tabs (Topiramate) .... 3 tabs  once daily per dr. Neale Burly 16)  Perphenazine 4 Mg Tabs (Perphenazine) .... Dr Neale Burly 17)  Zoloft 100 Mg Tabs (Sertraline hcl) .Marland Kitchen.. 1 tab by mouth daily 18)  Estradiol 0.5 Mg Tabs (Estradiol) .... Once daily 19)  Reclast 5 Mg/115ml Soln (Zoledronic acid) .... Once yearly (july)

## 2010-02-17 NOTE — Progress Notes (Signed)
Summary: meds  Phone Note Call from Patient   Caller: Patient Call For: Judithann Sheen MD Summary of Call: Needs Ritalin refills. Wants to cut back on Effexor and go to Zoloft. [phone]  Is down to one Effexor daily, and is not doing well on that.  Fatigued.  Needs advice. Initial call taken by: Lynann Beaver CMA,  January 29, 2009 2:17 PM  Follow-up for Phone Call        needs to see dr Scotty Court before going off effexor but now stay on effexor 1 once daily and sertraline 100mg  take two times a day can pick up ritalin tomorrow afternoon after 3 pm  Follow-up by: Pura Spice, RN,  January 29, 2009 2:59 PM  Additional Follow-up for Phone Call Additional follow up Details #1::        Pt notified. Additional Follow-up by: Lynann Beaver CMA,  January 29, 2009 3:19 PM    New/Updated Medications: METHYLPHENIDATE HCL 20 MG  TABS (METHYLPHENIDATE HCL) TAKE 1 by mouth three times a day  FILL ON Jan 29 2009 METHYLPHENIDATE HCL 20 MG  TABS (METHYLPHENIDATE HCL) 1 by mouth three times a day fill on   Mar 01 2009 METHYLPHENIDATE HCL 20 MG  TABS (METHYLPHENIDATE HCL) 1 by mouth three times a day fill March 29 2009 Prescriptions: METHYLPHENIDATE HCL 20 MG  TABS (METHYLPHENIDATE HCL) 1 by mouth three times a day fill March 29 2009  #90 x 0   Entered by:   Pura Spice, RN   Authorized by:   Judithann Sheen MD   Signed by:   Pura Spice, RN on 01/29/2009   Method used:   Print then Give to Patient   RxID:   [id number] METHYLPHENIDATE HCL 20 MG  TABS (METHYLPHENIDATE HCL) 1 by mouth three times a day fill on   Mar 01 2009  #90 x 0   Entered by:   Pura Spice, RN   Authorized by:   Judithann Sheen MD   Signed by:   Pura Spice, RN on 01/29/2009   Method used:   Print then Give to Patient   RxID:   [id number] METHYLPHENIDATE HCL 20 MG  TABS (METHYLPHENIDATE HCL) TAKE 1 by mouth three times a day  FILL ON Jan 29 2009  #92 x 0   Entered by:   Pura Spice, RN   Authorized by:   Judithann Sheen MD   Signed by:   Pura Spice, RN on 01/29/2009   Method used:   Print then Give to Patient   RxID:   [id number]

## 2010-02-17 NOTE — Progress Notes (Signed)
Summary: meds  Phone Note Call from Patient Call back at Home Phone [phone]   Caller: Patient Summary of Call: Requesting written mail order refill of zaleplon 10 mg cap # 180. Take 2 capsules by mouth at bedtime.  Not sure if it is time to have ritalin refilled.  If so please have filled for 3 months.  Initial call taken by: Trixie Dredge,  April 29, 2009 12:28 PM  Follow-up for Phone Call        office visit 6 month  Follow-up by: Pura Spice, RN,  April 30, 2009 9:00 AM  Additional Follow-up for Phone Call Additional follow up Details #1::        Left message to call back & schedule 6 mo ov with Dr. Scotty Court. Rudy Jew, RN  April 30, 2009 9:38 AM Has scheduled appointment.  Additional Follow-up by: Rudy Jew, RN,  April 30, 2009 12:15 PM

## 2010-03-09 ENCOUNTER — Telehealth: Payer: Self-pay | Admitting: Family Medicine

## 2010-03-09 DIAGNOSIS — F909 Attention-deficit hyperactivity disorder, unspecified type: Secondary | ICD-10-CM

## 2010-03-09 DIAGNOSIS — J40 Bronchitis, not specified as acute or chronic: Secondary | ICD-10-CM

## 2010-03-09 NOTE — Telephone Encounter (Signed)
Refill generic Ritalin 20 mg tid x 3rxs. Please call pt when ready. (Dr Abner Greenspan filled last rxs)

## 2010-03-10 ENCOUNTER — Telehealth: Payer: Self-pay | Admitting: Family Medicine

## 2010-03-10 MED ORDER — AZITHROMYCIN 250 MG PO TABS: ORAL_TABLET | ORAL | Status: AC

## 2010-03-10 MED ORDER — METHYLPHENIDATE HCL 20 MG PO TABS: 20.0000 mg | ORAL_TABLET | Freq: Three times a day (TID) | ORAL | Status: DC

## 2010-03-10 NOTE — Telephone Encounter (Signed)
Patient called again and would like a rx for a cough. She stated that she has bronchitis. Please call rx to [facility] on [address].

## 2010-03-10 NOTE — Telephone Encounter (Signed)
Pt called to adv that she needs to have cough med sent to [facility] on [address].... abx was sent in but cough med wasn't...Marland KitchenMarland Kitchen pts # [phone].

## 2010-03-13 NOTE — Telephone Encounter (Signed)
Chart opened in error

## 2010-03-30 ENCOUNTER — Encounter: Payer: Self-pay | Admitting: Family Medicine

## 2010-04-13 ENCOUNTER — Ambulatory Visit: Payer: Self-pay | Admitting: Internal Medicine

## 2010-05-21 ENCOUNTER — Ambulatory Visit: Payer: Self-pay | Admitting: Family Medicine

## 2010-05-27 ENCOUNTER — Encounter: Payer: Self-pay | Admitting: Family Medicine

## 2010-05-27 ENCOUNTER — Ambulatory Visit (INDEPENDENT_AMBULATORY_CARE_PROVIDER_SITE_OTHER): Payer: Medicare Other | Admitting: Family Medicine

## 2010-05-27 DIAGNOSIS — J40 Bronchitis, not specified as acute or chronic: Secondary | ICD-10-CM

## 2010-05-27 DIAGNOSIS — F909 Attention-deficit hyperactivity disorder, unspecified type: Secondary | ICD-10-CM

## 2010-05-27 MED ORDER — OXYCODONE-ACETAMINOPHEN 10-500 MG PO TABS: 1.0000 | ORAL_TABLET | ORAL | Status: AC | PRN

## 2010-05-27 MED ORDER — METHYLPHENIDATE HCL 20 MG PO TABS: 20.0000 mg | ORAL_TABLET | Freq: Three times a day (TID) | ORAL | Status: DC

## 2010-05-27 MED ORDER — METHYLPHENIDATE HCL 20 MG PO TABS: ORAL_TABLET | ORAL | Status: DC

## 2010-05-27 MED ORDER — LORAZEPAM 2 MG PO TABS: 2.0000 mg | ORAL_TABLET | Freq: Three times a day (TID) | ORAL | Status: DC

## 2010-05-27 MED ORDER — HYOSCYAMINE SULFATE ER 0.375 MG PO TB12: ORAL_TABLET | ORAL | Status: DC

## 2010-05-27 MED ORDER — ZALEPLON 10 MG PO CAPS: ORAL_CAPSULE | ORAL | Status: DC

## 2010-05-27 MED ORDER — SERTRALINE HCL 100 MG PO TABS: 100.0000 mg | ORAL_TABLET | Freq: Every day | ORAL | Status: DC

## 2010-06-02 NOTE — Op Note (Signed)
[name], Linda Kent                ACCOUNT NO.:  [id number]   MEDICAL RECORD NO.:  [id number]          PATIENT TYPE:  AMB   LOCATION:  DSC                          FACILITY:  [facility]   PHYSICIAN:  Claude Manges. Whitfield, M.D.DATE OF BIRTH:  1945/07/17   DATE OF PROCEDURE:  01/18/2008  DATE OF DISCHARGE:                               OPERATIVE REPORT   PREOPERATIVE DIAGNOSIS:  Adhesive capsulitis, right shoulder.   POSTOPERATIVE DIAGNOSIS:  Adhesive capsulitis, right shoulder.   PROCEDURE:  Manipulation, right shoulder.   SURGEON:  Claude Manges. Cleophas Dunker, MD   ANESTHESIA:  General mask with supplemental interscalene nerve block.   COMPLICATIONS:  None.   HISTORY:  This is a 65 year old female has had prior adhesive capsulitis  in her the left shoulder with successful manipulation.  She has  developed similar problem in her right shoulder over the past several  months, it has not responded to anti-inflammatory medicines and physical  therapy.  Her right shoulder adhesive capsulitis has limited her motion  to about 90 degrees of abduction and flexion and it has caused  considerable pain, she is now to have manipulation.   PROCEDURE:  With the patient comfortable on the operating room stretcher  and under general mask anesthesia, gentle manipulation of the right  shoulder was performed.  Prior to manipulation, she had about 90 degrees  of abduction and flexion.  Well asleep, I was able to abduct and flex  approximately 130 degrees respectively.  Manipulation released adhesions  that were audible, so that I could fully raise her arm over her head,  abduct, and even externally rotate approximately 50 degrees.  The  shoulder was then prepped with Betadine.  I injected 4 mL of 0.25%  Marcaine without epinephrine and 80 mg Depo-Medrol.  Band-Aid was  applied.  The patient was awoken, returned to postanesthesia recovery  room without problems.  She will have oxycodone for pain.      Claude Manges.  Cleophas Dunker, M.D.  Electronically Signed     PWW/MEDQ  D:  01/18/2008  T:  01/18/2008  Job:  [id number]

## 2010-06-05 NOTE — H&P (Signed)
[facility]  Patient:    Linda Kent, DIX Visit Number: [id number] MRN: [id number]          Service Type: MED Location: 3W [id number] Attending Physician:  Dalbert Mayotte Dictated by:   Radene Knee., M.D. Admit Date:  04/04/2001   CC:         Tinnie Gens C. Quintella Reichert, M.D.  Lacretia Leigh. Ninetta Lights, M.D.   History and Physical  CHIEF COMPLAINT:  This patient, age 65, is admitted through the [facility] Emergency Room with a chief complaint of painful swelling in the perineum and buttocks.  HISTORY OF PRESENT ILLNESS:  This 65 year old female was referred to my office with a history of recurrent urinary tract infections dating back some 34 years, which had become frequent recently and had been treated primarily by Tinnie Gens C. Quintella Reichert, M.D., with Cipro and Tequin.  She had no passed blood, gravel, or stone.  She did have a CT scan that revealed bilateral renal cysts, but no evidence of obstruction or stones.  She was getting up three times a night and voiding every hour.  The cultures and urinalyses in our office following antibiotics were normal.  She was given and cephalexin to use for infections and cranberry juice for prophylaxis against infections.  She was scheduled to return to our office on March 30, 2001, for cystoscopy.  This was carried out with local anesthesia.  The urethra was dilated to 23 [place].  She had mild to moderate urethral erythema.  No stone or tumor was noted.  The ureteral orifices were normal.  She did have moderate urethritis.  Following the cystoscopy, she was continued on cephalexin 500 mg b.i.d.  She states that about 24 hours later she developed at the junction of the vulva and the right buttock a red, irritated area that has spread despite treatment with Tinnie Gens C. Quintella Reichert, M.D., with Rocephin and clindamycin started on April 01, 2001. This has progressed and she was referred to the emergency room for  further consultation and admission and will be seen in consultation by Lacretia Leigh. Ninetta Lights, M.D., of infectious disease.  ALLERGIES:  None known.  MEDICATIONS:  1. Fosamax.  2. Vagifem.  3. Evista.  4. Meprobamate 400 mg p.r.n.  5. Synthroid 112 mcg daily.  6. Nasonex nasal spray.  7. Entex 1200 mg.  8. Novacet cream.  9. Seroquel 150 mg daily. 10. Ambien 10 mg nightly. 11. Celebrex 200 mg daily. 12. NuLev 0.125 mg as needed. 13. Ultram 50 mg two as needed. 14. Periactin 4 mg three to four times daily. 15. Fluoxetine 10 mg daily as needed. 16. For migraines, she can use Prozac and Imitrex and has Frova, Lorcet Plus,     Phenergan, and Compazine from [employer]. Meryl Crutch, M.D., and Lacretia Leigh.     Quintella Reichert, M.D.  FAMILY HISTORY:  Positive for diabetes in a grandmother and urinary tract infections in her mother.  Her father died following heart surgery at age 59. Her mother died of a fall at age 56.  She has two siblings.  Her spouse is 22. She has two children, ages 34 and 66.  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  PAST MEDICAL HISTORY AND PROBLEM LIST: 1. Migraines. 2. Irritable bowel syndrome. 3. Osteoporosis. 4. Mitral valve prolapse. 5. Hypothyroidism. 6. Rosacea. 7. Insomnia. 8. Osteoarthritis.  PAST SURGICAL HISTORY: 1. Ovarian cystectomy in 1969. 2. Hysterectomy in 1980. 3. A mole  removed from the right lower quadrant of the abdomen in 1999.  REVIEW OF SYSTEMS:  Positive for fatigue and headaches.  HEENT:  Unremarkable, except for the headaches.  Cardiorespiratory:  She has occasional swelling of her feet.  Denies any chest pain or heart attack.  No asthma.  She does at times have an irregular heartbeat.  GI:  She has constipation alternating with loose bowels attributed to her irritable bowel syndrome.  No hepatitis or peptic ulcer disease.  Extremities:  No problems other than a little edema at times and some mild arthritis.   Neuropsychiatric:  She has headaches.  No history of stroke.  Skin:  She has had a rash in the perineum.  Lymphatics: No nodes palpable.  PHYSICAL EXAMINATION:  Temperature 98.3 degrees, pulse 84, respirations 18, blood pressure 102/62.  HEENT:  The ears and tympanic membranes are unremarkable.  The eyes react normally to light and accommodation.  Extraocular movements intact.  Pharynx benign.  NECK:  No nodes or masses.  Thyroid not palpable.  CHEST:  Clear to auscultation and percussion.  HEART:  Normal sinus rhythm.  No murmur detected.  BREASTS:  Not examined.  ABDOMEN:  Low midline scar.  Liver, spleen, kidney, and masses not noted. There is a red, tender rash over the lower abdomen which extends into the perineum and up the buttocks posteriorly.  GENITALIA:  The bladder and urethra externally appear normal.  The meatus is not tender.  The bladder is not tender.  The support is fairly good.  The rectal tone is good.  No rectal or pelvic masses noted.  EXTREMITIES:  Trace edema.  Good pulses.  NEUROLOGIC:  Grossly normal reflexes and sensation.  IMPRESSION:  1. Cellulitis involving the perineum, lower abdomen, and buttocks of     uncertain etiology.  2. Recurrent urinary tract infections with cystoscopy and urethral dilation     on March 30, 2001.  3. Renal and liver cysts on CT scan.  4. Migraine headaches.  5. Osteoporosis.  6. Mitral valve prolapse.  7. Hypothyroidism.  8. Rosacea.  9. Irritable bowel syndrome. 10. Insomnia.  PLAN:  Admit the patient.  Get a consultation with infectious disease, Lacretia Leigh. Ninetta Lights, M.D.  Will do routine labs which show in the emergency room that she has a creatinine of 0.7, white count 6700, hematocrit 40, and platelets 212.  Her other parameters are within normal limits on her CMET. Dictated by:   Radene Knee., M.D.  Attending Physician:  Dalbert Mayotte DD:  04/04/01 TD:  04/05/01 Job:  [id number]

## 2010-06-21 ENCOUNTER — Encounter: Payer: Self-pay | Admitting: Family Medicine

## 2010-06-21 NOTE — Patient Instructions (Signed)
Have refilled her medications, return for your scheduled physical examination Get Debrox OTC and irrigate ears

## 2010-06-21 NOTE — Progress Notes (Signed)
  Subjective:    Patient ID: Linda Kent, female    DOB: 06-05-1945, 65 y.o.   MRN: [id number] This 66 year old white married female is in today complaining of continued pain in the left shoulder region for which she is being seen by an orthopedist with the diagnosis of myofascial pain syndromeShe is in today to refill her medications also complaining of some ear wax is [place] her ears to be examined in the past had been seen and treated for ADD, hypothyroidism allergic rhinitis arthritis anxiety depression migraine headaches she had been treated with 3 class IV osteoporosisIrritable bowel syndrome under control with Levbid b.i.d. insomnia has been a problem which she is treated with some of ADD history with Ritalin migraine headaches with Maxalt she also takes Topamax for chronic anxiety she is treated with Ativan her gynecologist treats her with Vagifem for postmenopausal syndromeHPI    Review of Systems See history of present illness    Objective:   Physical Exam the patient is a well-developed well-nourished pleasant white female who is in no distress at this time HEENT reveals slightly pale boggy nasal mucosa also small amount of wax bilaterally in both earsHeart lungs negative abdomen liver spleen and kidneys nonpalpable bowel sounds minimally hyperactive no masses no tenderness extremities examination of the left shoulder reveals pain on movement no pinpoint tenderness      Assessment & Plan:  In general the patient is in good condition Left shoulder myofascial pain syndrome being treated by orthopedist Insomnia ADD postmenopausal syndrome anxiety depression all doing well on the present treatment Patient to obtain Debrox to use for ear wax Refill all necessary medications

## 2010-08-04 ENCOUNTER — Other Ambulatory Visit: Payer: Self-pay

## 2010-08-04 MED ORDER — OXYCODONE-ACETAMINOPHEN 10-650 MG PO TABS: 1.0000 | ORAL_TABLET | ORAL | Status: DC | PRN

## 2010-08-06 ENCOUNTER — Encounter: Payer: Self-pay | Admitting: Family Medicine

## 2010-08-18 ENCOUNTER — Other Ambulatory Visit: Payer: Self-pay | Admitting: Neurology

## 2010-08-18 ENCOUNTER — Ambulatory Visit
Admission: RE | Admit: 2010-08-18 | Discharge: 2010-08-18 | Disposition: A | Discharge status: Home / Self Care | Payer: Medicare Other | Source: Ambulatory Visit | Attending: Neurology | Admitting: Neurology

## 2010-08-18 ENCOUNTER — Other Ambulatory Visit: Payer: Medicare Other

## 2010-08-18 DIAGNOSIS — R52 Pain, unspecified: Secondary | ICD-10-CM

## 2010-08-27 ENCOUNTER — Encounter: Payer: Self-pay | Admitting: Family Medicine

## 2010-10-23 LAB — POCT HEMOGLOBIN-HEMACUE: Hemoglobin: 13.6 g/dL (ref 12.0–15.0)

## 2010-10-28 ENCOUNTER — Ambulatory Visit: Payer: Medicare Other | Admitting: Family Medicine

## 2010-10-29 ENCOUNTER — Ambulatory Visit: Payer: Medicare Other | Admitting: Family Medicine

## 2010-10-30 ENCOUNTER — Other Ambulatory Visit: Payer: Self-pay | Admitting: Family Medicine

## 2010-10-30 NOTE — Telephone Encounter (Signed)
Pt would like zaleplon 10 mg and generic zoloft 100mg  call into [facility] [phone]. Pt had ov sch with dr Clent Ridges 11-02-2010. Pt is to sick to come in.

## 2010-10-30 NOTE — Telephone Encounter (Signed)
Call in #30 of each with no rf

## 2010-11-02 ENCOUNTER — Ambulatory Visit: Payer: Medicare Other | Admitting: Family Medicine

## 2010-11-02 MED ORDER — ZALEPLON 10 MG PO CAPS: ORAL_CAPSULE | ORAL | Status: DC

## 2010-11-02 MED ORDER — SERTRALINE HCL 100 MG PO TABS: 100.0000 mg | ORAL_TABLET | Freq: Every day | ORAL | Status: DC

## 2010-11-02 NOTE — Telephone Encounter (Signed)
Scripts called in

## 2010-11-05 ENCOUNTER — Ambulatory Visit (INDEPENDENT_AMBULATORY_CARE_PROVIDER_SITE_OTHER): Payer: Medicare Other | Admitting: Family Medicine

## 2010-11-05 ENCOUNTER — Encounter: Payer: Self-pay | Admitting: Family Medicine

## 2010-11-05 DIAGNOSIS — Z23 Encounter for immunization: Secondary | ICD-10-CM

## 2010-11-05 DIAGNOSIS — R079 Chest pain, unspecified: Secondary | ICD-10-CM

## 2010-11-05 MED ORDER — LORAZEPAM 2 MG PO TABS: 2.0000 mg | ORAL_TABLET | Freq: Three times a day (TID) | ORAL | Status: DC

## 2010-11-05 MED ORDER — SERTRALINE HCL 100 MG PO TABS: 100.0000 mg | ORAL_TABLET | Freq: Every day | ORAL | Status: DC

## 2010-11-05 MED ORDER — ZALEPLON 10 MG PO CAPS: ORAL_CAPSULE | ORAL | Status: DC

## 2010-11-05 NOTE — Progress Notes (Signed)
  Subjective:    Patient ID: Linda Kent, female    DOB: 09-21-1945, 65 y.o.   MRN: [id number]  HPI Here for recent chest pains which she thinks are caused by stress. Over the past few months she has had frequent episodes of mild dull pains in the chest which are sometimes associated with exertion but at other times are not. She is sometimes SOB but not always. No nausea or sweats. She describes a lot of anxiety which she feels build up inside g=her at times. No trouble swallowing or heartburn.    Review of Systems  Constitutional: Negative.   Respiratory: Positive for shortness of breath. Negative for apnea, cough, choking, chest tightness, wheezing and stridor.   Cardiovascular: Positive for chest pain. Negative for palpitations and leg swelling.  Gastrointestinal: Negative.   Psychiatric/Behavioral: Negative for dysphoric mood. The patient is nervous/anxious.        Objective:   Physical Exam  Constitutional: She appears well-developed and well-nourished.  Neck: Neck supple. No thyromegaly present.  Cardiovascular: Normal rate, regular rhythm, normal heart sounds and intact distal pulses.  Exam reveals no gallop and no friction rub.   No murmur heard.      EKG is normal   Pulmonary/Chest: Effort normal and breath sounds normal. No respiratory distress. She has no wheezes. She has no rales. She exhibits no tenderness.  Abdominal: Soft. Bowel sounds are normal. She exhibits no distension and no mass. There is no tenderness. There is no rebound and no guarding.  Lymphadenopathy:    She has no cervical adenopathy.  Psychiatric: She has a normal mood and affect. Her behavior is normal. Thought content normal.          Assessment & Plan:etainly   These pains are not likely to be cardiac in origin, but we need to rule this out. Set up a stress test soon. She certainly has a lot more anxiety lately, so we will increase the Zoloft to a full 100 mg daily.

## 2010-11-16 ENCOUNTER — Ambulatory Visit (HOSPITAL_COMMUNITY): Payer: Medicare Other | Attending: Family Medicine | Admitting: Radiology

## 2010-11-16 DIAGNOSIS — I059 Rheumatic mitral valve disease, unspecified: Secondary | ICD-10-CM

## 2010-11-16 DIAGNOSIS — R079 Chest pain, unspecified: Secondary | ICD-10-CM | POA: Insufficient documentation

## 2010-11-16 DIAGNOSIS — R0789 Other chest pain: Secondary | ICD-10-CM

## 2010-11-16 MED ORDER — TECHNETIUM TC 99M TETROFOSMIN IV KIT
10.7000 | PACK | Freq: Once | INTRAVENOUS | Status: AC | PRN
  Administered 2010-11-16: 11 via INTRAVENOUS

## 2010-11-16 MED ORDER — TECHNETIUM TC 99M TETROFOSMIN IV KIT
33.0000 | PACK | Freq: Once | INTRAVENOUS | Status: AC | PRN
  Administered 2010-11-16: 33 via INTRAVENOUS

## 2010-11-16 NOTE — Progress Notes (Signed)
[facility] SITE 3 NUCLEAR MED [address] [place] [address] [phone]  Cardiology Nuclear Med Study  EARLY ORD is a 65 y.o. female [id number] [birth year]   Nuclear Med Background Indication for Stress Test:  Evaluation for Ischemia History:  MVP and ~20 yrs ago GXT:OK per patient Cardiac Risk Factors: Family History - CAD and Lipids  Symptoms:  Chest Pain/Pressure with and without Exertion (last episode of chest discomfort was about two days ago), Chronic Fatigue, Nausea, Palpitations and SOB "can't get a deep breath".   Nuclear Pre-Procedure Caffeine/Decaff Intake:  None NPO After: 10:00pm   Lungs:  clear IV 0.9% NS with Angio Cath:  20g  IV Site: R Antecubital  IV Started by:  Stanton Kidney, EMT-P  Chest Size (in):  34 Cup Size: B  Height: 5\' 4"  (1.626 m)  Weight:  126 lb (57.153 kg)  BMI:  Body mass index is 21.63 kg/(m^2). Tech Comments:  NA    Nuclear Med Study 1 or 2 day study: 1 day  Stress Test Type:  Stress  Reading MD: Marca Ancona, MD  Order Authorizing Provider:  Gershon Crane, MD  Resting Radionuclide: Technetium 10m Tetrofosmin  Resting Radionuclide Dose: 10.7 mCi   Stress Radionuclide:  Technetium 3m Tetrofosmin  Stress Radionuclide Dose: 33.0 mCi           Stress Protocol Rest HR: 74 Stress HR: 141  Rest BP: Sitting 101/78  Standing  88/71 Stress BP: 167/67  Exercise Time (min): 6:00 METS: 7.0   Predicted Max HR: 153 bpm % Max HR: 92.16 bpm Rate Pressure Product: 46962   Dose of Adenosine (mg):  n/a Dose of Lexiscan: n/a mg  Dose of Atropine (mg): n/a Dose of Dobutamine: n/a mcg/kg/min (at max HR)  Stress Test Technologist: Smiley Houseman, CMA-N  Nuclear Technologist:  Domenic Polite, CNMT     Rest Procedure:  Myocardial perfusion imaging was performed at rest 45 minutes following the intravenous administration of Technetium 28m Tetrofosmin.  Rest ECG: No acute changes.  Stress Procedure:  The patient exercised for  six minutes on the treadmill utilizing the Bruce protocol.  The patient stopped due to fatigue and denied any chest pain.  There were no diagnostic ST-T wave changes.  Technetium 15m Tetrofosmin was injected at peak exercise and myocardial perfusion imaging was performed after a brief delay.  Stress ECG: No significant change from baseline ECG  QPS Raw Data Images:  Normal; no motion artifact; normal heart/lung ratio. Stress Images:  Normal homogeneous uptake in all areas of the myocardium. Rest Images:  Normal homogeneous uptake in all areas of the myocardium. Subtraction (SDS):  There is no evidence of scar or ischemia. Transient Ischemic Dilatation (Normal <1.22):  1.04 Lung/Heart Ratio (Normal <0.45):  0.32  Quantitative Gated Spect Images QGS EDV:  56 ml QGS ESV:  18 ml QGS cine images:  NL LV Function; NL Wall Motion QGS EF: 68%  Impression Exercise Capacity:  Fair exercise capacity. BP Response:  Normal blood pressure response. Clinical Symptoms:  Fatigue, no chest pain.  ECG Impression:  No significant ST segment change suggestive of ischemia. Comparison with Prior Nuclear Study: No previous nuclear study performed  Overall Impression:  Normal stress nuclear study.  Palma Buster [employer]

## 2011-03-16 DIAGNOSIS — G43019 Migraine without aura, intractable, without status migrainosus: Secondary | ICD-10-CM | POA: Diagnosis not present

## 2011-03-25 ENCOUNTER — Ambulatory Visit (HOSPITAL_COMMUNITY)
Admission: RE | Admit: 2011-03-25 | Discharge: 2011-03-25 | Disposition: A | Discharge status: Home / Self Care | Payer: Medicare Other | Source: Ambulatory Visit | Attending: Anesthesiology | Admitting: Anesthesiology

## 2011-03-25 ENCOUNTER — Other Ambulatory Visit (HOSPITAL_COMMUNITY): Payer: Self-pay | Admitting: Anesthesiology

## 2011-03-25 DIAGNOSIS — M898X1 Other specified disorders of bone, shoulder: Secondary | ICD-10-CM

## 2011-03-25 DIAGNOSIS — M549 Dorsalgia, unspecified: Secondary | ICD-10-CM | POA: Diagnosis not present

## 2011-03-25 DIAGNOSIS — M25519 Pain in unspecified shoulder: Secondary | ICD-10-CM | POA: Diagnosis not present

## 2011-03-25 DIAGNOSIS — Z79899 Other long term (current) drug therapy: Secondary | ICD-10-CM | POA: Diagnosis not present

## 2011-03-25 DIAGNOSIS — IMO0001 Reserved for inherently not codable concepts without codable children: Secondary | ICD-10-CM | POA: Diagnosis not present

## 2011-03-25 DIAGNOSIS — M4120 Other idiopathic scoliosis, site unspecified: Secondary | ICD-10-CM | POA: Diagnosis not present

## 2011-03-25 DIAGNOSIS — M546 Pain in thoracic spine: Secondary | ICD-10-CM | POA: Diagnosis not present

## 2011-04-06 ENCOUNTER — Ambulatory Visit
Admission: RE | Admit: 2011-04-06 | Discharge: 2011-04-06 | Disposition: A | Discharge status: Home / Self Care | Payer: Medicare Other | Source: Ambulatory Visit | Attending: Anesthesiology | Admitting: Anesthesiology

## 2011-04-06 ENCOUNTER — Other Ambulatory Visit: Payer: Self-pay | Admitting: Anesthesiology

## 2011-04-06 DIAGNOSIS — M25519 Pain in unspecified shoulder: Secondary | ICD-10-CM | POA: Diagnosis not present

## 2011-04-06 DIAGNOSIS — G8929 Other chronic pain: Secondary | ICD-10-CM

## 2011-04-09 DIAGNOSIS — IMO0001 Reserved for inherently not codable concepts without codable children: Secondary | ICD-10-CM | POA: Diagnosis not present

## 2011-04-13 DIAGNOSIS — M546 Pain in thoracic spine: Secondary | ICD-10-CM | POA: Diagnosis not present

## 2011-04-13 DIAGNOSIS — M999 Biomechanical lesion, unspecified: Secondary | ICD-10-CM | POA: Diagnosis not present

## 2011-04-21 ENCOUNTER — Telehealth: Payer: Self-pay | Admitting: Family Medicine

## 2011-04-21 NOTE — Telephone Encounter (Signed)
Spoke with pt and she is aware that Dr. Clent Ridges is out of office until next week and she does have enough to last until she gets the mail order. Please call pt when this is done.

## 2011-04-21 NOTE — Telephone Encounter (Signed)
Patient called stating that she need a refell for zaleplon 10mg -180 tabs to go to her mail order pharmacy [facility]. Please assist.

## 2011-04-25 NOTE — Telephone Encounter (Signed)
Refill Zaleplon ([place]) for #180 with one rf

## 2011-04-27 MED ORDER — ZALEPLON 10 MG PO CAPS: ORAL_CAPSULE | ORAL | Status: DC

## 2011-04-27 NOTE — Telephone Encounter (Signed)
Addended by: Melchor Amour on: 04/27/2011 03:49 PM   Modules accepted: Orders

## 2011-05-05 ENCOUNTER — Ambulatory Visit (INDEPENDENT_AMBULATORY_CARE_PROVIDER_SITE_OTHER): Payer: Medicare Other | Admitting: Family Medicine

## 2011-05-05 ENCOUNTER — Encounter: Payer: Self-pay | Admitting: Family Medicine

## 2011-05-05 VITALS — BP 102/70 | HR 102 | Temp 98.4°F | Ht 64.0 in | Wt 124.0 lb

## 2011-05-05 DIAGNOSIS — E785 Hyperlipidemia, unspecified: Secondary | ICD-10-CM | POA: Diagnosis not present

## 2011-05-05 DIAGNOSIS — G43109 Migraine with aura, not intractable, without status migrainosus: Secondary | ICD-10-CM | POA: Diagnosis not present

## 2011-05-05 DIAGNOSIS — F909 Attention-deficit hyperactivity disorder, unspecified type: Secondary | ICD-10-CM

## 2011-05-05 DIAGNOSIS — M546 Pain in thoracic spine: Secondary | ICD-10-CM | POA: Diagnosis not present

## 2011-05-05 DIAGNOSIS — M7918 Myalgia, other site: Secondary | ICD-10-CM

## 2011-05-05 DIAGNOSIS — Z136 Encounter for screening for cardiovascular disorders: Secondary | ICD-10-CM | POA: Diagnosis not present

## 2011-05-05 DIAGNOSIS — IMO0001 Reserved for inherently not codable concepts without codable children: Secondary | ICD-10-CM | POA: Diagnosis not present

## 2011-05-05 DIAGNOSIS — E039 Hypothyroidism, unspecified: Secondary | ICD-10-CM

## 2011-05-05 DIAGNOSIS — G43909 Migraine, unspecified, not intractable, without status migrainosus: Secondary | ICD-10-CM

## 2011-05-05 LAB — BASIC METABOLIC PANEL
CO2: 27 mEq/L (ref 19–32)
Chloride: 110 mEq/L (ref 96–112)
Potassium: 5.4 mEq/L — ABNORMAL HIGH (ref 3.5–5.1)
Sodium: 144 mEq/L (ref 135–145)

## 2011-05-05 LAB — HEPATIC FUNCTION PANEL
ALT: 12 U/L (ref 0–35)
AST: 18 U/L (ref 0–37)
Alkaline Phosphatase: 61 U/L (ref 39–117)
Bilirubin, Direct: 0 mg/dL (ref 0.0–0.3)
Total Bilirubin: 0.4 mg/dL (ref 0.3–1.2)
Total Protein: 7 g/dL (ref 6.0–8.3)

## 2011-05-05 LAB — POCT URINALYSIS DIPSTICK
Blood, UA: NEGATIVE
Ketones, UA: NEGATIVE
Protein, UA: NEGATIVE
Spec Grav, UA: 1.015
Urobilinogen, UA: 0.2

## 2011-05-05 LAB — CBC WITH DIFFERENTIAL/PLATELET
Basophils Absolute: 0 10*3/uL (ref 0.0–0.1)
Eosinophils Relative: 4.6 % (ref 0.0–5.0)
HCT: 43.3 % (ref 36.0–46.0)
Hemoglobin: 14.4 g/dL (ref 12.0–15.0)
Lymphs Abs: 1.7 10*3/uL (ref 0.7–4.0)
MCV: 98.6 fl (ref 78.0–100.0)
Monocytes Absolute: 0.4 10*3/uL (ref 0.1–1.0)
Monocytes Relative: 7.9 % (ref 3.0–12.0)
Neutro Abs: 2.1 10*3/uL (ref 1.4–7.7)
Platelets: 265 10*3/uL (ref 150.0–400.0)
RDW: 13.4 % (ref 11.5–14.6)

## 2011-05-05 LAB — LIPID PANEL
Cholesterol: 239 mg/dL — ABNORMAL HIGH (ref 0–200)
Total CHOL/HDL Ratio: 3
VLDL: 13.4 mg/dL (ref 0.0–40.0)

## 2011-05-05 LAB — TSH: TSH: 0.75 u[IU]/mL (ref 0.35–5.50)

## 2011-05-05 MED ORDER — METHYLPHENIDATE HCL 20 MG PO TABS: 20.0000 mg | ORAL_TABLET | Freq: Every day | ORAL | Status: DC

## 2011-05-05 MED ORDER — LORAZEPAM 2 MG PO TABS: 2.0000 mg | ORAL_TABLET | Freq: Three times a day (TID) | ORAL | Status: DC

## 2011-05-05 MED ORDER — OXYCODONE-ACETAMINOPHEN 10-650 MG PO TABS: 1.0000 | ORAL_TABLET | Freq: Four times a day (QID) | ORAL | Status: AC | PRN

## 2011-05-05 NOTE — Progress Notes (Signed)
  Subjective:    Patient ID: Linda Kent, female    DOB: 12-23-1945, 66 y.o.   MRN: [id number]  HPI 66 yr old female for a cpx. In general she is doing well except for chronic pain. She has had migraines for years, and she averages several a month. She has seen the [facility] in the past but was not happy with how this worked out. She does not plan to go back. She also has chronic pain in the left shoulder area and the left shoulder blade. She has had negative Xrays of this area. It has been diagnosed as myofacial pain, and she was referred to Dr. Vear Clock. She saw him only once, and he prescribed a Lidocaine patch. This did not help, and she is very upset that he refused to give her any other type of pain medication. 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 This has worked well for her and she asks to try it again.    Review of Systems  Constitutional: Negative.   HENT: Negative.   Eyes: Negative.   Respiratory: Negative.   Cardiovascular: Negative.   Gastrointestinal: Negative.   Genitourinary: Negative for dysuria, urgency, frequency, hematuria, flank pain, decreased urine volume, enuresis, difficulty urinating, pelvic pain and dyspareunia.  Musculoskeletal: Negative.   Skin: Negative.   Neurological: Negative.   Hematological: Negative.   Psychiatric/Behavioral: Negative.        Objective:   Physical Exam  Constitutional: She is oriented to person, place, and time. She appears well-developed and well-nourished. No distress.  HENT:  Head: Normocephalic and atraumatic.  Right Ear: External ear normal.  Left Ear: External ear normal.  Nose: Nose normal.  Mouth/Throat: Oropharynx is clear and moist. No oropharyngeal exudate.  Eyes: Conjunctivae and EOM are normal. Pupils are equal, round, and reactive to light. No scleral icterus.  Neck: Normal range of motion. Neck supple. No JVD present. No thyromegaly present.  Cardiovascular: Normal rate, regular rhythm,  normal heart sounds and intact distal pulses.  Exam reveals no gallop and no friction rub.   No murmur heard.      EKG normal   Pulmonary/Chest: Effort normal and breath sounds normal. No respiratory distress. She has no wheezes. She has no rales. She exhibits no tenderness.  Abdominal: Soft. Bowel sounds are normal. She exhibits no distension and no mass. There is no tenderness. There is no rebound and no guarding.  Musculoskeletal: Normal range of motion. She exhibits no edema and no tenderness.  Lymphadenopathy:    She has no cervical adenopathy.  Neurological: She is alert and oriented to person, place, and time. She has normal reflexes. No cranial nerve deficit. She exhibits normal muscle tone. Coordination normal.  Skin: Skin is warm and dry. No rash noted. No erythema.  Psychiatric: She has a normal mood and affect. Her behavior is normal. Judgment and thought content normal.          Assessment & Plan:  Well exam. Get fasting labs. Try some Percocet prn

## 2011-05-11 ENCOUNTER — Telehealth: Payer: Self-pay | Admitting: *Deleted

## 2011-05-11 ENCOUNTER — Other Ambulatory Visit: Payer: Self-pay | Admitting: Family Medicine

## 2011-05-11 DIAGNOSIS — E785 Hyperlipidemia, unspecified: Secondary | ICD-10-CM

## 2011-05-11 MED ORDER — ATORVASTATIN CALCIUM 20 MG PO TABS: 20.0000 mg | ORAL_TABLET | Freq: Every day | ORAL | Status: DC

## 2011-05-11 NOTE — Progress Notes (Signed)
Quick Note:  Pt informed, labs and med ordered ______ 

## 2011-05-11 NOTE — Telephone Encounter (Signed)
Pt husband and other family members have had trouble with Crestor.  She is requesting Zetia instead, or another suggestion.

## 2011-05-12 MED ORDER — EZETIMIBE 10 MG PO TABS: 10.0000 mg | ORAL_TABLET | Freq: Every day | ORAL | Status: DC

## 2011-05-12 NOTE — Telephone Encounter (Signed)
I did cancel Lipitor order, sent scribe for Zetia e-scribe and spoke with pt.

## 2011-05-12 NOTE — Telephone Encounter (Signed)
Noted. Cancel the Lipitor order, and call in one year of Zetia 10 mg a day. Recheck labs in 90 days

## 2011-05-26 DIAGNOSIS — M546 Pain in thoracic spine: Secondary | ICD-10-CM | POA: Diagnosis not present

## 2011-05-26 DIAGNOSIS — IMO0001 Reserved for inherently not codable concepts without codable children: Secondary | ICD-10-CM | POA: Diagnosis not present

## 2011-06-04 DIAGNOSIS — N9089 Other specified noninflammatory disorders of vulva and perineum: Secondary | ICD-10-CM | POA: Diagnosis not present

## 2011-06-04 DIAGNOSIS — B373 Candidiasis of vulva and vagina: Secondary | ICD-10-CM | POA: Diagnosis not present

## 2011-06-15 ENCOUNTER — Telehealth: Payer: Self-pay | Admitting: Family Medicine

## 2011-06-15 MED ORDER — EZETIMIBE 10 MG PO TABS: 10.0000 mg | ORAL_TABLET | Freq: Every day | ORAL | Status: DC

## 2011-06-15 NOTE — Telephone Encounter (Signed)
done

## 2011-06-15 NOTE — Telephone Encounter (Signed)
Patient called stating that her zetia 10mg  is too expensive to get locally and she would like to have a handwritten rx for zetia 10 mg 90 day supply with 3 refills so that she may get it from [place]. Please assist and inform patient when done. Patient states to please do not mark rx brand only as she will request the generic.

## 2011-06-15 NOTE — Telephone Encounter (Signed)
Script is ready for pick up and I spoke with pt.  

## 2011-07-07 DIAGNOSIS — B373 Candidiasis of vulva and vagina: Secondary | ICD-10-CM | POA: Diagnosis not present

## 2011-07-07 DIAGNOSIS — N899 Noninflammatory disorder of vagina, unspecified: Secondary | ICD-10-CM | POA: Diagnosis not present

## 2011-07-30 ENCOUNTER — Encounter: Payer: Self-pay | Admitting: Family Medicine

## 2011-07-30 ENCOUNTER — Ambulatory Visit (INDEPENDENT_AMBULATORY_CARE_PROVIDER_SITE_OTHER): Payer: Medicare Other | Admitting: Family Medicine

## 2011-07-30 VITALS — BP 106/68 | HR 88 | Temp 98.3°F | Wt 124.0 lb

## 2011-07-30 DIAGNOSIS — J40 Bronchitis, not specified as acute or chronic: Secondary | ICD-10-CM

## 2011-07-30 MED ORDER — AZITHROMYCIN 250 MG PO TABS: ORAL_TABLET | ORAL | Status: AC

## 2011-07-30 MED ORDER — METHYLPHENIDATE HCL 20 MG PO TABS: 30.0000 mg | ORAL_TABLET | Freq: Every day | ORAL | Status: DC

## 2011-07-30 MED ORDER — HYDROCODONE-HOMATROPINE 5-1.5 MG/5ML PO SYRP: 5.0000 mL | ORAL_SOLUTION | ORAL | Status: AC | PRN

## 2011-07-30 MED ORDER — NYSTATIN 500000 UNITS PO CAPS: 1.0000 | ORAL_CAPSULE | Freq: Three times a day (TID) | ORAL | Status: DC

## 2011-07-30 NOTE — Progress Notes (Signed)
  Subjective:    Patient ID: SEINI LANNOM, female    DOB: 04-Aug-1945, 66 y.o.   MRN: [id number]  HPI Here for one week of PND, ST, hoarseness, and coughing up green sputum. No fever.    Review of Systems  Constitutional: Negative.   HENT: Positive for congestion, postnasal drip and sinus pressure.   Eyes: Negative.   Respiratory: Positive for cough.        Objective:   Physical Exam  Constitutional: She appears well-developed and well-nourished.  HENT:  Right Ear: External ear normal.  Left Ear: External ear normal.  Nose: Nose normal.  Mouth/Throat: Oropharynx is clear and moist.  Eyes: Conjunctivae are normal.  Neck: No thyromegaly present.  Pulmonary/Chest: Effort normal. No respiratory distress. She has no wheezes. She has no rales.       Scattered rhonchi   Lymphadenopathy:    She has no cervical adenopathy.          Assessment & Plan:  Recheck prn

## 2011-08-02 ENCOUNTER — Telehealth: Payer: Self-pay | Admitting: Family Medicine

## 2011-08-02 MED ORDER — AMOXICILLIN 875 MG PO TABS: 875.0000 mg | ORAL_TABLET | Freq: Two times a day (BID) | ORAL | Status: AC

## 2011-08-02 NOTE — Telephone Encounter (Signed)
Caller: Linda Kent/Patient; PCP: Nelwyn Salisbury.; CB#: [phone]; Call regarding Patient Has Been On Medicine and Still Has Bronchitis onset 07/26/11 . Seen 07/30/11. Does not feel better since she was seen.  On Zithromax for same.  Afebrile/subjective. Caller denies sinus pain; states, "I have migraines". Advised see provider in 24 hours. Home care for the interim.   Caller states,"I don't need to come in there; I am no different than when I was seen on Friday (07/30/11).  The medicine did not work."  Information noted and sent to office for follow up as patient refused appointement.

## 2011-08-02 NOTE — Telephone Encounter (Signed)
I sent script e-scribe and spoke with pt. 

## 2011-08-02 NOTE — Telephone Encounter (Signed)
Please advise 

## 2011-08-02 NOTE — Telephone Encounter (Signed)
Call in Amoxicillin 875 mg bid for 10 days  

## 2011-08-04 DIAGNOSIS — M546 Pain in thoracic spine: Secondary | ICD-10-CM | POA: Diagnosis not present

## 2011-08-04 DIAGNOSIS — IMO0001 Reserved for inherently not codable concepts without codable children: Secondary | ICD-10-CM | POA: Diagnosis not present

## 2011-08-13 ENCOUNTER — Emergency Department (HOSPITAL_COMMUNITY)
Admission: EM | Admit: 2011-08-13 | Discharge: 2011-08-13 | Disposition: A | Discharge status: Home / Self Care | Payer: Medicare Other | Attending: Emergency Medicine | Admitting: Emergency Medicine

## 2011-08-13 ENCOUNTER — Emergency Department (HOSPITAL_COMMUNITY): Payer: Medicare Other

## 2011-08-13 ENCOUNTER — Encounter (HOSPITAL_COMMUNITY): Payer: Self-pay | Admitting: Emergency Medicine

## 2011-08-13 DIAGNOSIS — J9819 Other pulmonary collapse: Secondary | ICD-10-CM | POA: Diagnosis not present

## 2011-08-13 DIAGNOSIS — E785 Hyperlipidemia, unspecified: Secondary | ICD-10-CM | POA: Diagnosis not present

## 2011-08-13 DIAGNOSIS — R079 Chest pain, unspecified: Secondary | ICD-10-CM

## 2011-08-13 DIAGNOSIS — J984 Other disorders of lung: Secondary | ICD-10-CM | POA: Diagnosis not present

## 2011-08-13 DIAGNOSIS — F988 Other specified behavioral and emotional disorders with onset usually occurring in childhood and adolescence: Secondary | ICD-10-CM | POA: Insufficient documentation

## 2011-08-13 DIAGNOSIS — M810 Age-related osteoporosis without current pathological fracture: Secondary | ICD-10-CM | POA: Diagnosis not present

## 2011-08-13 DIAGNOSIS — K589 Irritable bowel syndrome without diarrhea: Secondary | ICD-10-CM | POA: Insufficient documentation

## 2011-08-13 DIAGNOSIS — F3289 Other specified depressive episodes: Secondary | ICD-10-CM | POA: Insufficient documentation

## 2011-08-13 DIAGNOSIS — F329 Major depressive disorder, single episode, unspecified: Secondary | ICD-10-CM | POA: Diagnosis not present

## 2011-08-13 DIAGNOSIS — Z9089 Acquired absence of other organs: Secondary | ICD-10-CM | POA: Insufficient documentation

## 2011-08-13 DIAGNOSIS — Z79899 Other long term (current) drug therapy: Secondary | ICD-10-CM | POA: Insufficient documentation

## 2011-08-13 DIAGNOSIS — R5381 Other malaise: Secondary | ICD-10-CM | POA: Diagnosis not present

## 2011-08-13 LAB — CBC
HCT: 42.1 % (ref 36.0–46.0)
Hemoglobin: 14.4 g/dL (ref 12.0–15.0)
MCH: 32.6 pg (ref 26.0–34.0)
MCHC: 34.2 g/dL (ref 30.0–36.0)
RDW: 12.6 % (ref 11.5–15.5)

## 2011-08-13 LAB — URINALYSIS, ROUTINE W REFLEX MICROSCOPIC
Bilirubin Urine: NEGATIVE
Glucose, UA: NEGATIVE mg/dL
Hgb urine dipstick: NEGATIVE
Ketones, ur: NEGATIVE mg/dL
Nitrite: NEGATIVE
Specific Gravity, Urine: 1.006 (ref 1.005–1.030)
pH: 6.5 (ref 5.0–8.0)

## 2011-08-13 LAB — BASIC METABOLIC PANEL
BUN: 11 mg/dL (ref 6–23)
Calcium: 9.2 mg/dL (ref 8.4–10.5)
Creatinine, Ser: 0.76 mg/dL (ref 0.50–1.10)
GFR calc Af Amer: >90 mL/min (ref >90)
GFR calc non Af Amer: 85 mL/min — ABNORMAL LOW (ref >90)
Glucose, Bld: 91 mg/dL (ref 70–99)

## 2011-08-13 LAB — CK TOTAL AND CKMB (NOT AT ARMC): Total CK: 51 U/L (ref 7–177)

## 2011-08-13 LAB — POCT I-STAT TROPONIN I

## 2011-08-13 MED ORDER — IOHEXOL 350 MG/ML SOLN
85.0000 mL | Freq: Once | INTRAVENOUS | Status: AC | PRN
  Administered 2011-08-13: 85 mL via INTRAVENOUS

## 2011-08-13 MED ORDER — SODIUM CHLORIDE 0.9 % IV SOLN
Freq: Once | INTRAVENOUS | Status: AC
  Administered 2011-08-13: 19:00:00 via INTRAVENOUS

## 2011-08-13 NOTE — ED Notes (Signed)
Pt got up and went to the bathroom, when she got back into the room she stayed she was very weak and "something is just not right"

## 2011-08-13 NOTE — ED Notes (Addendum)
C/o generalized weakness, intermittent sharp L sided chest pain, sob, and nausea x 1 week.  Pt thought symptoms may be related to anxiety but states anxiety medication did not relieve symptoms.

## 2011-08-13 NOTE — ED Provider Notes (Signed)
History     CSN: [id number]  Arrival date & time 08/13/11  1511   First MD Initiated Contact with Patient 08/13/11 1816      Chief Complaint  Patient presents with  . Chest Pain    (Consider location/radiation/quality/duration/timing/severity/associated sxs/prior treatment) Patient is a 66 y.o. female presenting with chest pain. The history is provided by the patient.  Chest Pain    patient here with left-sided chest pain x1 week which is been intermittent and lasting seconds. She notes increased dyspnea without fever or cough. Denies any leg pain but does note increased time spent in her bed. No anginal symptoms. Denies any leg pain or swelling. Patient thought that it might be due to her stopping her Zetia medication a week ago. No medications used for this prior to arrival. No prior history of same  Past Medical History  Diagnosis Date  . IBS (irritable bowel syndrome)   . Back pain   . Migraines   . Chronic insomnia   . Recurrent cystitis     after intercourse, controlled with macrobid   . Post menopausal syndrome   . ADD (attention deficit disorder)   . Hypothyroidism   . Depression   . Hyperlipidemia   . Osteoporosis   . Colon polyps     Past Surgical History  Procedure Date  . Abdominal hysterectomy     parital for prolapse  . Ovarian cyst removal     excised on left   . Tonsillectomy   . Appendectomy   . Colonoscopy 2012    per Dr. Madilyn Fireman, clear, repeat in 5 yrs     Family History  Problem Relation Age of Onset  . Other Brother     low serotonin levels   . Other Sister     low serotonin levels  . Colon polyps Sister   . Cancer Other 70    puncle decreased from colon cancer     History  Substance Use Topics  . Smoking status: Never Smoker   . Smokeless tobacco: Never Used  . Alcohol Use: No    OB History    Grav Para Term Preterm Abortions TAB SAB Ect Mult Living                  Review of Systems  Cardiovascular: Positive for chest  pain.  All other systems reviewed and are negative.    Allergies  Cephalexin  Home Medications   Current Outpatient Rx  Name Route Sig Dispense Refill  . VITAMIN C 1000 MG PO TABS Oral Take 1,000 mg by mouth 3 (three) times daily.    . ASPIRIN 81 MG PO TBEC Oral Take 81 mg by mouth daily.      Marland Kitchen CALCIUM 1500 MG PO TABS Oral Take 1,500 mg by mouth daily. With Vitamin D    . ESTRADIOL 0.5 MG PO TABS Oral Take 0.5 mg by mouth daily.    Marland Kitchen ESTRADIOL 25 MCG VA TABS Vaginal Place 25 mcg vaginally 2 (two) times a week. On Wednesday and Sunday    . FEXOFENADINE HCL 180 MG PO TABS Oral Take 180 mg by mouth daily.      . OMEGA-3 FATTY ACIDS 1000 MG PO CAPS Oral Take 2 g by mouth daily.    Marland Kitchen LEVOTHYROXINE SODIUM 100 MCG PO TABS Oral Take 100 mcg by mouth daily. Brand name only     . LORAZEPAM 2 MG PO TABS Oral Take 2 mg by mouth at bedtime.    Marland Kitchen  MELATONIN 3 MG PO CAPS Oral Take 9 mg by mouth at bedtime.     . METHYLPHENIDATE HCL 20 MG PO TABS Oral Take 20 mg by mouth daily.    Marland Kitchen METRONIDAZOLE 0.75 % EX CREA Topical Apply 1 application topically daily.     Marland Kitchen ONE-DAILY MULTI VITAMINS PO TABS Oral Take 1 tablet by mouth daily. Centrum 50 +    . NYSTATIN 500000 UNITS PO CAPS Oral Take 1 capsule by mouth 3 (three) times daily as needed. For yeast infection    . PERPHENAZINE 4 MG PO TABS Oral Take 4 mg by mouth every 4 (four) hours as needed. For migraines    . ALIGN PO Oral Take 1 capsule by mouth daily.     Marland Kitchen RIZATRIPTAN BENZOATE 10 MG PO TBDP Oral Take 10 mg by mouth as needed. May repeat in 2 hours if needed. Maximum 4 per week dr. Neale Burly    . TOPIRAMATE 50 MG PO TABS Oral Take 150 mg by mouth daily.     Marland Kitchen ZALEPLON 10 MG PO CAPS Oral Take 20 mg by mouth at bedtime.     . AMOXICILLIN 875 MG PO TABS Oral Take 1 tablet (875 mg total) by mouth 2 (two) times daily. 20 tablet 0    BP 104/52  Pulse 87  Temp 97.9 F (36.6 C) (Oral)  Resp 16  SpO2 100%  Physical Exam  Nursing note and vitals  reviewed. Constitutional: She is oriented to person, place, and time. She appears well-developed and well-nourished.  Non-toxic appearance. No distress.  HENT:  Head: Normocephalic and atraumatic.  Eyes: Conjunctivae, EOM and lids are normal. Pupils are equal, round, and reactive to light.  Neck: Normal range of motion. Neck supple. No tracheal deviation present. No mass present.  Cardiovascular: Normal rate, regular rhythm and normal heart sounds.  Exam reveals no gallop.   No murmur heard. Pulmonary/Chest: Effort normal and breath sounds normal. No stridor. No respiratory distress. She has no decreased breath sounds. She has no wheezes. She has no rhonchi. She has no rales.  Abdominal: Soft. Normal appearance and bowel sounds are normal. She exhibits no distension. There is no tenderness. There is no rebound and no CVA tenderness.  Musculoskeletal: Normal range of motion. She exhibits no edema and no tenderness.  Neurological: She is alert and oriented to person, place, and time. She has normal strength. No cranial nerve deficit or sensory deficit. GCS eye subscore is 4. GCS verbal subscore is 5. GCS motor subscore is 6.  Skin: Skin is warm and dry. No abrasion and no rash noted.  Psychiatric: She has a normal mood and affect. Her speech is normal and behavior is normal.    ED Course  Procedures (including critical care time)  Labs Reviewed  BASIC METABOLIC PANEL - Abnormal; Notable for the following:    GFR calc non Af Amer 85 (*)     All other components within normal limits  CBC  POCT I-STAT TROPONIN I   Dg Chest 2 View  08/13/2011  *RADIOLOGY REPORT*  Clinical Data: 66 year old female with chest pain and weakness.  CHEST - 2 VIEW  Comparison: 03/25/2011 and prior chest radiographs  Findings: The cardiomediastinal silhouette is unremarkable. Hyperinflation is again noted. Mild biapical pleuroparenchymal scarring is unchanged. There is no evidence of focal airspace disease, pulmonary  edema, suspicious pulmonary nodule/mass, pleural effusion, or pneumothorax. No acute bony abnormalities are identified.  IMPRESSION: No evidence of acute cardiopulmonary disease.  Original Report Authenticated  By: JEFFREY T. HU, M.D.     No diagnosis found.    MDM   Date: 08/13/2011  Rate: 96  Rhythm: normal sinus rhythm  QRS Axis: normal  Intervals: normal  ST/T Wave abnormalities: normal  Conduction Disutrbances:none  Narrative Interpretation:   Old EKG Reviewed: none available   Patient had chest CT to rule out PE due to her shortness of breath and immobility and pleuritic pain which was negative. Patient feels that her medications might be related to this. And she feels for discharge. No concern for ACS at this time as her chest pain is atypical for angina. She is stable for discharge       Toy Baker, MD 08/13/11 2137

## 2011-08-15 LAB — URINE CULTURE: Colony Count: 4000

## 2011-08-23 DIAGNOSIS — E559 Vitamin D deficiency, unspecified: Secondary | ICD-10-CM | POA: Diagnosis not present

## 2011-08-23 DIAGNOSIS — I10 Essential (primary) hypertension: Secondary | ICD-10-CM | POA: Diagnosis not present

## 2011-08-23 DIAGNOSIS — M810 Age-related osteoporosis without current pathological fracture: Secondary | ICD-10-CM | POA: Diagnosis not present

## 2011-08-23 DIAGNOSIS — E039 Hypothyroidism, unspecified: Secondary | ICD-10-CM | POA: Diagnosis not present

## 2011-08-30 ENCOUNTER — Other Ambulatory Visit: Payer: Self-pay | Admitting: Family Medicine

## 2011-08-30 NOTE — Telephone Encounter (Signed)
Pt called req refill zaleplon (SONATA) 10 MG capsule (pt wants this written-take 2 nightly and fill #180). LORazepam (ATIVAN) 2 MG tablet take 1 tab by more three times daily. [facility] on [address].

## 2011-08-30 NOTE — Telephone Encounter (Signed)
Last seen 7/12/3 bronchitis  Last written lorazepam 05/05/11 # 90 5RF Sonata - 04/27/11 80 1RF Please advise

## 2011-08-30 NOTE — Telephone Encounter (Signed)
No she is not due for refills until Sept on Sonata and Oct on Lorazepam

## 2011-08-30 NOTE — Telephone Encounter (Signed)
Spoke with pt informed

## 2011-09-06 DIAGNOSIS — Z1231 Encounter for screening mammogram for malignant neoplasm of breast: Secondary | ICD-10-CM | POA: Diagnosis not present

## 2011-09-07 DIAGNOSIS — G43019 Migraine without aura, intractable, without status migrainosus: Secondary | ICD-10-CM | POA: Diagnosis not present

## 2011-09-09 DIAGNOSIS — N6489 Other specified disorders of breast: Secondary | ICD-10-CM | POA: Diagnosis not present

## 2011-09-15 DIAGNOSIS — Z79890 Hormone replacement therapy (postmenopausal): Secondary | ICD-10-CM | POA: Diagnosis not present

## 2011-09-15 DIAGNOSIS — N951 Menopausal and female climacteric states: Secondary | ICD-10-CM | POA: Diagnosis not present

## 2011-09-15 DIAGNOSIS — N952 Postmenopausal atrophic vaginitis: Secondary | ICD-10-CM | POA: Diagnosis not present

## 2011-09-22 ENCOUNTER — Ambulatory Visit: Payer: Medicare Other | Admitting: Family Medicine

## 2011-10-11 DIAGNOSIS — M810 Age-related osteoporosis without current pathological fracture: Secondary | ICD-10-CM | POA: Diagnosis not present

## 2011-10-12 DIAGNOSIS — M546 Pain in thoracic spine: Secondary | ICD-10-CM | POA: Diagnosis not present

## 2011-10-12 DIAGNOSIS — IMO0001 Reserved for inherently not codable concepts without codable children: Secondary | ICD-10-CM | POA: Diagnosis not present

## 2011-10-25 ENCOUNTER — Telehealth: Payer: Self-pay | Admitting: Family Medicine

## 2011-10-25 MED ORDER — ZALEPLON 10 MG PO CAPS: 20.0000 mg | ORAL_CAPSULE | Freq: Every day | ORAL | Status: DC

## 2011-10-25 MED ORDER — METRONIDAZOLE 0.75 % EX CREA: 1.0000 "application " | TOPICAL_CREAM | Freq: Every day | CUTANEOUS | Status: DC

## 2011-10-25 MED ORDER — METHYLPHENIDATE HCL ER 20 MG PO TBCR: 20.0000 mg | EXTENDED_RELEASE_TABLET | ORAL | Status: DC

## 2011-10-25 NOTE — Telephone Encounter (Signed)
I wrote for the Ritalin. Call in one year supply of both the other meds

## 2011-10-25 NOTE — Telephone Encounter (Signed)
Pt called and is req refill of zaleplon (SONATA) 10 MG capsule #180 to [facility]. Pt also needs to get refill of metroNIDAZOLE (METROCREAM) 0.75 % cream to [facility] on [address] with 1 refill.     Also pt needs refill of methylphenidate (RITALIN) 20 MG tablet . Pt will pick up this script when ready.

## 2011-10-25 NOTE — Telephone Encounter (Signed)
The one script is ready for pick up and I spoke with pt. I still need to call the other 2 scripts in.

## 2011-10-25 NOTE — Telephone Encounter (Signed)
I faxed script for Sonata to [facility] and sent script e-scribe for Metrocream to [facility], and spoke with pt.

## 2011-10-28 DIAGNOSIS — Z23 Encounter for immunization: Secondary | ICD-10-CM | POA: Diagnosis not present

## 2011-11-29 DIAGNOSIS — H2510 Age-related nuclear cataract, unspecified eye: Secondary | ICD-10-CM | POA: Diagnosis not present

## 2011-11-29 DIAGNOSIS — H5200 Hypermetropia, unspecified eye: Secondary | ICD-10-CM | POA: Diagnosis not present

## 2011-11-29 DIAGNOSIS — H52229 Regular astigmatism, unspecified eye: Secondary | ICD-10-CM | POA: Diagnosis not present

## 2011-11-30 ENCOUNTER — Other Ambulatory Visit: Payer: Self-pay | Admitting: Family Medicine

## 2011-11-30 NOTE — Telephone Encounter (Signed)
Pt needs new rx methylphenidate 20 mg take 1.5 tablet by mouth daily#45. Pt needs 3 separate  rxs for next 3 months

## 2011-12-01 MED ORDER — METHYLPHENIDATE HCL 20 MG PO TABS: 30.0000 mg | ORAL_TABLET | Freq: Every day | ORAL | Status: DC

## 2011-12-01 NOTE — Telephone Encounter (Signed)
I spoke with pt and script is ready for pick up.  

## 2011-12-01 NOTE — Telephone Encounter (Signed)
done

## 2011-12-08 DIAGNOSIS — M546 Pain in thoracic spine: Secondary | ICD-10-CM | POA: Diagnosis not present

## 2011-12-08 DIAGNOSIS — IMO0001 Reserved for inherently not codable concepts without codable children: Secondary | ICD-10-CM | POA: Diagnosis not present

## 2011-12-23 ENCOUNTER — Telehealth: Payer: Self-pay | Admitting: Family Medicine

## 2011-12-23 NOTE — Telephone Encounter (Signed)
I spoke with pt and she will call back if she decides to come in.

## 2011-12-23 NOTE — Telephone Encounter (Signed)
It is not so simple to treat depression over the phone. She is on a number of medications that could interact with this medication. She must have an OV for me to explore these things and make an appropriate decision for her. If she is that worried about catching the flu, we could bring her back to an exam room when she arrives so she wont have to wait out in the lobby

## 2011-12-23 NOTE — Telephone Encounter (Signed)
Patient Information:  Caller Name: Linda Kent  Phone: [phone]  Patient: Linda Kent, Linda Kent  Gender: Female  DOB: December 28, 1945  Age: 66 Years  PCP: Linda Kent [facility])   Symptoms  Reason For Call & Symptoms: Used to be on an antidepressant; but came off of it because she thought she was doing ok.  But recently has starting crying and has blamed crying on a variety of things.  Reviewed Health History In EMR: Yes  Reviewed Medications In EMR: Yes  Reviewed Allergies In EMR: Yes  Reviewed Surgeries / Procedures: Yes  Date of Onset of Symptoms: 06/19/2011  Guideline(s) Used:  No Protocol Available - Sick Adult  Disposition Per Guideline:   See Within 3 Days in Office  Reason For Disposition Reached:   Nursing judgment  Advice Given:  Call Back If:  New symptoms develop  You become worse.  Office Follow Up:  Does the office need to follow up with this patient?: Yes  Instructions For The Office: Is requesting generic Effexor, 37.5 mg BID be called in to [facility] on [address].  She took this until Medicare would not cover, but will now in generic form.  Has not been on Zoloft for almost a year.  Patient Refused Recommendation:  Patient Requests Prescription  Patient declines appointment due to fear of catching the flu.  RN Note:  Assessed using CECC guideline for anxiety with a disposition to be seen within 24 hours due to increasing or worsening tears/crying spells.  Patient declined appointment due to fear of catching flu that is in offices now.  Is requesting antidepresant she has been on before prior to Zoloft--went of it for Zoloft because it was not covered with Medicare, but now is in generic form.  She has not taken Zoloft in about 6- year--really didn't think it helped.  Is requesting generic Effexor 37.5 mg BID.  Drug store of choice is [facility] on [address] at [phone]. Last visit with Linda Kent noted as 07/30/11.

## 2012-03-08 DIAGNOSIS — G43019 Migraine without aura, intractable, without status migrainosus: Secondary | ICD-10-CM | POA: Diagnosis not present

## 2012-04-25 DIAGNOSIS — N6489 Other specified disorders of breast: Secondary | ICD-10-CM | POA: Diagnosis not present

## 2012-05-10 ENCOUNTER — Ambulatory Visit (INDEPENDENT_AMBULATORY_CARE_PROVIDER_SITE_OTHER): Payer: Medicare Other | Admitting: Family Medicine

## 2012-05-10 ENCOUNTER — Encounter: Payer: Self-pay | Admitting: Family Medicine

## 2012-05-10 VITALS — BP 112/62 | HR 97 | Temp 97.8°F | Ht 64.25 in | Wt 121.0 lb

## 2012-05-10 DIAGNOSIS — Z8659 Personal history of other mental and behavioral disorders: Secondary | ICD-10-CM

## 2012-05-10 DIAGNOSIS — E785 Hyperlipidemia, unspecified: Secondary | ICD-10-CM | POA: Diagnosis not present

## 2012-05-10 DIAGNOSIS — K589 Irritable bowel syndrome without diarrhea: Secondary | ICD-10-CM

## 2012-05-10 DIAGNOSIS — M7918 Myalgia, other site: Secondary | ICD-10-CM

## 2012-05-10 DIAGNOSIS — IMO0001 Reserved for inherently not codable concepts without codable children: Secondary | ICD-10-CM

## 2012-05-10 DIAGNOSIS — L719 Rosacea, unspecified: Secondary | ICD-10-CM | POA: Diagnosis not present

## 2012-05-10 DIAGNOSIS — M129 Arthropathy, unspecified: Secondary | ICD-10-CM

## 2012-05-10 DIAGNOSIS — G43909 Migraine, unspecified, not intractable, without status migrainosus: Secondary | ICD-10-CM

## 2012-05-10 DIAGNOSIS — M810 Age-related osteoporosis without current pathological fracture: Secondary | ICD-10-CM | POA: Diagnosis not present

## 2012-05-10 LAB — HEPATIC FUNCTION PANEL
ALT: 14 U/L (ref 0–35)
Alkaline Phosphatase: 67 U/L (ref 39–117)
Bilirubin, Direct: 0 mg/dL (ref 0.0–0.3)
Total Bilirubin: 0.5 mg/dL (ref 0.3–1.2)

## 2012-05-10 LAB — LIPID PANEL
Cholesterol: 241 mg/dL — ABNORMAL HIGH (ref 0–200)
Total CHOL/HDL Ratio: 4
VLDL: 45.6 mg/dL — ABNORMAL HIGH (ref 0.0–40.0)

## 2012-05-10 LAB — BASIC METABOLIC PANEL
CO2: 29 mEq/L (ref 19–32)
Chloride: 106 mEq/L (ref 96–112)
Creatinine, Ser: 0.8 mg/dL (ref 0.4–1.2)
Sodium: 140 mEq/L (ref 135–145)

## 2012-05-10 LAB — CBC WITH DIFFERENTIAL/PLATELET
Eosinophils Relative: 2.4 % (ref 0.0–5.0)
HCT: 42 % (ref 36.0–46.0)
Hemoglobin: 13.9 g/dL (ref 12.0–15.0)
Lymphs Abs: 1.7 10*3/uL (ref 0.7–4.0)
MCV: 98.6 fl (ref 78.0–100.0)
Monocytes Absolute: 0.3 10*3/uL (ref 0.1–1.0)
Monocytes Relative: 7.8 % (ref 3.0–12.0)
Neutro Abs: 2 10*3/uL (ref 1.4–7.7)
WBC: 4.2 10*3/uL — ABNORMAL LOW (ref 4.5–10.5)

## 2012-05-10 LAB — POCT URINALYSIS DIPSTICK
Blood, UA: NEGATIVE
Glucose, UA: NEGATIVE
Ketones, UA: NEGATIVE
Protein, UA: NEGATIVE
Spec Grav, UA: 1.015

## 2012-05-10 LAB — TSH: TSH: 0.99 u[IU]/mL (ref 0.35–5.50)

## 2012-05-10 MED ORDER — LORAZEPAM 2 MG PO TABS: 2.0000 mg | ORAL_TABLET | Freq: Three times a day (TID) | ORAL | Status: DC

## 2012-05-10 MED ORDER — METRONIDAZOLE 0.75 % EX CREA: 1.0000 "application " | TOPICAL_CREAM | Freq: Every day | CUTANEOUS | Status: DC

## 2012-05-10 MED ORDER — METHYLPHENIDATE HCL 20 MG PO TABS: 20.0000 mg | ORAL_TABLET | Freq: Three times a day (TID) | ORAL | Status: DC

## 2012-05-10 MED ORDER — METHYLPHENIDATE HCL 20 MG PO TABS: 30.0000 mg | ORAL_TABLET | Freq: Three times a day (TID) | ORAL | Status: DC

## 2012-05-10 MED ORDER — DULOXETINE HCL 30 MG PO CPEP: 30.0000 mg | ORAL_CAPSULE | Freq: Every day | ORAL | Status: DC

## 2012-05-10 MED ORDER — METAXALONE 800 MG PO TABS: 800.0000 mg | ORAL_TABLET | Freq: Four times a day (QID) | ORAL | Status: DC | PRN

## 2012-05-10 MED ORDER — ZOLEDRONIC ACID 5 MG/100ML IV SOLN: 5.0000 mg | Freq: Once | INTRAVENOUS | Status: DC

## 2012-05-10 MED ORDER — ZALEPLON 10 MG PO CAPS: 20.0000 mg | ORAL_CAPSULE | Freq: Every day | ORAL | Status: DC

## 2012-05-10 NOTE — Progress Notes (Signed)
Subjective:    Patient ID: Linda Kent, female    DOB: 1945-10-09, 67 y.o.   MRN: [id number]  HPI 67 yr old female for a cpx. She also has a number of issues to discuss. Her main concern is her chronic pain syndrome. She has been suffering from severe daily back pain for about 5 years. This is centered in the left upper back around the scapula. She has had negative Xrays etc of the area. She has been seeing Dr. Thyra Breed for this for about one year, and he has given her Vicodin to take. She does take this 1 to 3 times a day but this is not helping much at all. The pain is unrelenting and has taken over her life. She spends most of her days in bed lying on a heating pad. She cannot exercise at all, and she relies on her husband to do most of the house work. She is very frustrated with this and asks if there are any procedures that can be done to help her. She has discussed this with Dr. Vear Clock, and her impression is that he only wants to prescribe her pain meds. She greatly dislikes taking pain meds and would like to avoid taking any pain meds at all if possible. She remains depressed most of the time and asks if this can be addressed. She sleeps fairly well by taking Sonata.    Review of Systems  Constitutional: Positive for fatigue. Negative for fever, diaphoresis, activity change, appetite change and unexpected weight change.  HENT: Negative.  Negative for hearing loss, ear pain, nosebleeds, congestion, sore throat, trouble swallowing, neck pain, neck stiffness, voice change and tinnitus.   Eyes: Negative.  Negative for photophobia, pain, discharge, redness and visual disturbance.  Respiratory: Negative.  Negative for apnea, cough, choking, chest tightness, shortness of breath, wheezing and stridor.   Cardiovascular: Negative.  Negative for chest pain, palpitations and leg swelling.  Gastrointestinal: Negative.  Negative for nausea, vomiting, abdominal pain, diarrhea, constipation, blood in  stool, abdominal distention and rectal pain.  Genitourinary: Negative.  Negative for dysuria, urgency, frequency, hematuria, flank pain, decreased urine volume, vaginal bleeding, vaginal discharge, enuresis, difficulty urinating, vaginal pain, menstrual problem, pelvic pain and dyspareunia.  Musculoskeletal: Positive for myalgias and back pain. Negative for joint swelling, arthralgias and gait problem.  Skin: Negative.  Negative for color change, pallor, rash and wound.  Neurological: Negative.  Negative for dizziness, tremors, seizures, syncope, speech difficulty, weakness, light-headedness, numbness and headaches.  Hematological: Negative for adenopathy. Does not bruise/bleed easily.  Psychiatric/Behavioral: Positive for dysphoric mood. Negative for hallucinations, behavioral problems, confusion, sleep disturbance and agitation. The patient is not nervous/anxious.        Objective:   Physical Exam  Constitutional: She is oriented to person, place, and time. She appears well-developed and well-nourished. No distress.  HENT:  Head: Normocephalic and atraumatic.  Right Ear: External ear normal.  Left Ear: External ear normal.  Nose: Nose normal.  Mouth/Throat: Oropharynx is clear and moist. No oropharyngeal exudate.  Eyes: Conjunctivae and EOM are normal. Pupils are equal, round, and reactive to light. No scleral icterus.  Neck: Normal range of motion. Neck supple. No JVD present. No thyromegaly present.  Cardiovascular: Normal rate, regular rhythm, normal heart sounds and intact distal pulses.  Exam reveals no gallop and no friction rub.   No murmur heard. EKG normal   Pulmonary/Chest: Effort normal and breath sounds normal. No respiratory distress. She has no wheezes. She has no rales.  She exhibits no tenderness.  Abdominal: Soft. Bowel sounds are normal. She exhibits no distension and no mass. There is no tenderness. There is no rebound and no guarding.  Musculoskeletal: Normal range of  motion. She exhibits no edema.  Very tender throughout the upper left back  Lymphadenopathy:    She has no cervical adenopathy.  Neurological: She is alert and oriented to person, place, and time. She has normal reflexes. No cranial nerve deficit. She exhibits normal muscle tone. Coordination normal.  Skin: Skin is warm and dry. No rash noted. No erythema.  Psychiatric: Her behavior is normal. Judgment and thought content normal.  Tearful           Assessment & Plan:  Well exam. Get fasting labs today. Start on Cymbalta for the depression, and we will follow up in 4 weeks. As far as her back pain, I think she would be an excellent candidate for some type of interventional procedures, whether this be steroid injections, radiofrequency nerve ablations, or something else. We will refer her to Physical Medicine and Rehab to evaluate this.

## 2012-05-12 NOTE — Progress Notes (Signed)
Quick Note:  I spoke with pt and put a copy of results in mail. ______ 

## 2012-05-16 ENCOUNTER — Encounter: Payer: Self-pay | Admitting: Physical Medicine & Rehabilitation

## 2012-05-30 ENCOUNTER — Telehealth: Payer: Self-pay | Admitting: Family Medicine

## 2012-05-30 NOTE — Telephone Encounter (Signed)
I agree. Stop the Cymbalta. Call in Effexor 37.5 mg bid, #60 with 2 rf. Follow up with me in one month

## 2012-05-30 NOTE — Telephone Encounter (Signed)
Caller: Linda Kent/Patient; Phone: [phone]; Reason for Call: Patient calls to report that she can not take Cymbalta.  It triggers old migraine pattern that had been resolved.  Patient made several attempts to take it with it trigger headaches each time.  She is requesting this to be changed to Effexor 37.  5 mg twice daily if provider is agreeable.  Drug store of choice is [facility] on [address] at [phone].  Please follow up patient regarding this requested change.  Denied need for assessment at this time.  Last appointment with Dr. Clent Ridges was on 05/10/12

## 2012-05-31 MED ORDER — VENLAFAXINE HCL 37.5 MG PO TABS: 37.5000 mg | ORAL_TABLET | Freq: Two times a day (BID) | ORAL | Status: DC

## 2012-05-31 NOTE — Telephone Encounter (Signed)
I sent script e-scribe and left voice message with below information.

## 2012-06-08 ENCOUNTER — Ambulatory Visit: Payer: Medicare Other

## 2012-06-09 ENCOUNTER — Ambulatory Visit: Payer: Medicare Other | Admitting: Physical Medicine & Rehabilitation

## 2012-06-13 ENCOUNTER — Telehealth: Payer: Self-pay | Admitting: Family Medicine

## 2012-06-13 NOTE — Telephone Encounter (Signed)
I spoke with pt  

## 2012-06-13 NOTE — Telephone Encounter (Signed)
It is okay to start Effexor. Keep her present appointment

## 2012-06-13 NOTE — Telephone Encounter (Signed)
I understand now. Tell her to postpone the appt until one month after starting the med

## 2012-06-13 NOTE — Telephone Encounter (Signed)
I spoke with pt and she will start the medication and she said that she was supposed to come back one month after starting the medication, is this okay?

## 2012-06-13 NOTE — Telephone Encounter (Signed)
1. Pt was rx'd venlafaxine (EFFEXOR) 37.5 MG tablet. Pt said one of the warnings on effexor was that tell your MD if you have high Cholesterol and pt does,.Pt will not start med until advised. Pt would like advise. 2.  Pt wants to know is: her follow up app on 6/18,  for her meds (effexor), or follow up from pain clinic. (Pain clinic has been postponed to June 10).

## 2012-06-27 ENCOUNTER — Ambulatory Visit: Payer: Medicare Other | Admitting: Physical Medicine & Rehabilitation

## 2012-07-04 ENCOUNTER — Encounter: Payer: Medicare Other | Attending: Physical Medicine & Rehabilitation

## 2012-07-04 ENCOUNTER — Encounter: Payer: Self-pay | Admitting: Physical Medicine & Rehabilitation

## 2012-07-04 ENCOUNTER — Ambulatory Visit (HOSPITAL_BASED_OUTPATIENT_CLINIC_OR_DEPARTMENT_OTHER): Payer: Medicare Other | Admitting: Physical Medicine & Rehabilitation

## 2012-07-04 VITALS — BP 120/70 | HR 88 | Resp 14 | Ht 64.0 in | Wt 128.0 lb

## 2012-07-04 DIAGNOSIS — M25519 Pain in unspecified shoulder: Secondary | ICD-10-CM | POA: Insufficient documentation

## 2012-07-04 DIAGNOSIS — M949 Disorder of cartilage, unspecified: Secondary | ICD-10-CM | POA: Diagnosis not present

## 2012-07-04 DIAGNOSIS — IMO0001 Reserved for inherently not codable concepts without codable children: Secondary | ICD-10-CM | POA: Diagnosis not present

## 2012-07-04 DIAGNOSIS — M899 Disorder of bone, unspecified: Secondary | ICD-10-CM

## 2012-07-04 DIAGNOSIS — G8929 Other chronic pain: Secondary | ICD-10-CM

## 2012-07-04 NOTE — Patient Instructions (Addendum)
We discussed the possibility of Botox injection to that area. This would be an off label use and likely not covered by insurance. The cost would be over $500. I'm not certain whether it would be helpful for your pain.  [facility] in [place] may be a good resource for you.Dr Roderic Ovens or Dr Serafina Royals. This pain is not clear cut myofascial pain since you have quite a bit of skin hypersensitivity in addition to point tenderness over a muscle.

## 2012-07-04 NOTE — Progress Notes (Signed)
Subjective:    Patient ID: Linda Kent, female    DOB: 02/11/1945, 67 y.o.   MRN: [id number] Chief complaint left shoulder pain for greater than 2 years HPI Left scapular pain which is severe. Pain started while working on a puzzle. No severe trauma history. Evaluated by orthopedic surgeon. He was then referred to neurologist. Has had x-rays that I had a chance to review which were unremarkable of the left shoulder. Chest x-ray and rib x-ray dated 08/18/2010 unremarkable. Has also seen rheumatology 11/28/2009. Nuclear medicine bone scan dated 11/28/2009 showed minimal uptake left subacromial space but otherwise normal. MRI left shoulder 10/18/2009 showed some tendon noted tendinosis but no other significant abnormalities. Neurologic evaluation Dr. Vela Prose 2012. Impression was myofascial pain disorder. Tried trigger point injection which was not helpful. Physical therapy evaluations 5 sessions 06/22/2010. Shoulder strengthening program. No improvement in pain after treatment. MRI of the cervical spine 03/02/2010 with accentuated cervical lordosis facet arthrosis C3-4 and C4-5 no other abnormalities. No compression of the nerve roots. Medications prescribed by neurology included baclofen and as needed Relafen. Also tried Zanaflex or without relief. Pain is increased with the use of the left arm or hand.  Has also tried Lidoderm patch which caused skin problems, dry needle did not help. TENS unit did not help. Cortisone shots were not helpful. Acupuncture did not work and did not tolerate. Chiropractic treatments made condition worse. Saw a pain management specialist Dr. Vear Clock who started some hydrocodone which helped for about a week and a half ~89mo ago  Helpful, not sure how well it works.  She keeps her arm in a sling.  States this helps pain.  Also states that pain with movement is delayed in onset to next day sometimes.  Stays in bed 20hours a day  Pain Inventory Average Pain 8 Pain Right Now  8 My pain is constant, sharp, stabbing, aching and throbbing  In the last 24 hours, has pain interfered with the following? General activity 10 Relation with others 10 Enjoyment of life 10 What TIME of day is your pain at its worst? daytime evening  Sleep (in general) Good with medication, without poor  Pain is worse with: walking, bending, sitting and some activites Pain improves with: rest, heat/ice and medication Relief from Meds: 7  Mobility walk without assistance ability to climb steps?  yes do you drive?  no  Function retired I need assistance with the following:  bathing, meal prep, household duties and shopping  Neuro/Psych depression anxiety  Prior Studies Any changes since last visit?  no  Physicians involved in your care    Family History  Problem Relation Age of Onset  . Other Brother     low serotonin levels   . Other Sister     low serotonin levels  . Colon polyps Sister   . Cancer Other 70    puncle decreased from colon cancer    History   Social History  . Marital Status: Married    Spouse Name: N/A    Number of Children: N/A  . Years of Education: N/A   Social History Main Topics  . Smoking status: Never Smoker   . Smokeless tobacco: Never Used  . Alcohol Use: No  . Drug Use: No  . Sexually Active: Yes   Other Topics Concern  . None   Social History Narrative  . None   Past Surgical History  Procedure Laterality Date  . Abdominal hysterectomy      parital for  prolapse  . Ovarian cyst removal      excised on left   . Tonsillectomy    . Appendectomy    . Colonoscopy  2012    per Dr. Madilyn Fireman, clear, repeat in 5 yrs    Past Medical History  Diagnosis Date  . IBS (irritable bowel syndrome)   . Back pain   . Migraines     sees Dr. Santiago Glad   . Chronic insomnia   . Recurrent cystitis     after intercourse, controlled with macrobid   . Post menopausal syndrome   . ADD (attention deficit disorder)   . Depression    . Hyperlipidemia   . Colon polyps   . Hypothyroidism     sees Dr. Zetta Bills   . Osteoporosis     sees Dr. Zetta Bills   . Routine gynecological examination     sees [employer]   . Myofascial pain syndrome     sees Dr. Loraine Leriche Philips   . Acne rosacea    BP 120/70  Pulse 88  Resp 14  Ht 5\' 4"  (1.626 m)  Wt 128 lb (58.06 kg)  BMI 21.96 kg/m2  SpO2 100%    Review of Systems  Musculoskeletal:       Myofacial pain  Neurological: Positive for headaches.  Psychiatric/Behavioral: Positive for sleep disturbance and dysphoric mood. The patient is nervous/anxious.   All other systems reviewed and are negative.       Objective:   Physical Exam  Nursing note and vitals reviewed. Constitutional: She is oriented to person, place, and time. She appears well-developed and well-nourished.  HENT:  Head: Normocephalic and atraumatic.  Eyes: Conjunctivae are normal. Pupils are equal, round, and reactive to light.  Neck: Normal range of motion. Neck supple.  Musculoskeletal:       Left shoulder: She exhibits normal range of motion and no deformity.  No pain with shoulder internal and external rotation. Abduction is limited to 90 No limitation with abduction.  Motor strength is 5/5 in the deltoids, biceps, triceps, grip, internal and external rotators. Shoulder retractor and protractor strength is good  Tenderness to palpation along the ankle of the scapula and medial to the inferior border of the scapula. No tenderness over the infraspinatus fossa. Mild tenderness along the thoracic paraspinal muscles.  Skin overlying the area has evidence of heating pad thermal damage.  Neurological: She is alert and oriented to person, place, and time. She has normal strength and normal reflexes. She displays no atrophy. No sensory deficit. She exhibits normal muscle tone. Coordination and gait normal.    Neck ROM full negative Spurling's      Assessment & Plan:  1.  Atypical periscapular  pain.  Extensive workup reveals no apparent source.  Has diagnosis of myofascial pain syndrome but pain is not provoked by activating scapular stabilizing muscles as expected.  Also even light palpation of the skin provokes severe pain.  Also she reports reports needing to stay in bed 20 hours a day because of this pain, this is also atypical of myofascila pain.  She has had all of the usual treatments for myofascial pain without success. The pain has not changed in character or location over time so a repeat imaging w/u is not necessary.  There may be a neurogenic component perhaps an intercostal neuralgia but does't have typical nerve radiation pattern.   We discussed remaining treatment  options which are limited.  A botox injection to that area could  be of potential benefit but off label and not covered by insurance.  We also discussed referral to [facility] in [place] to eval for peripheral nerve stimulation- she would like to pursue this.  Thank you for this consultation.  No follow up will be scheduled

## 2012-07-05 ENCOUNTER — Ambulatory Visit: Payer: Medicare Other | Admitting: Family Medicine

## 2012-07-12 DIAGNOSIS — M546 Pain in thoracic spine: Secondary | ICD-10-CM | POA: Diagnosis not present

## 2012-07-12 DIAGNOSIS — Z79899 Other long term (current) drug therapy: Secondary | ICD-10-CM | POA: Diagnosis not present

## 2012-07-12 DIAGNOSIS — IMO0001 Reserved for inherently not codable concepts without codable children: Secondary | ICD-10-CM | POA: Diagnosis not present

## 2012-07-17 ENCOUNTER — Ambulatory Visit (INDEPENDENT_AMBULATORY_CARE_PROVIDER_SITE_OTHER): Payer: Medicare Other | Admitting: Family Medicine

## 2012-07-17 ENCOUNTER — Encounter: Payer: Self-pay | Admitting: Family Medicine

## 2012-07-17 VITALS — BP 118/82 | HR 68 | Temp 97.9°F | Wt 125.0 lb

## 2012-07-17 DIAGNOSIS — N951 Menopausal and female climacteric states: Secondary | ICD-10-CM

## 2012-07-17 DIAGNOSIS — G43909 Migraine, unspecified, not intractable, without status migrainosus: Secondary | ICD-10-CM | POA: Diagnosis not present

## 2012-07-17 DIAGNOSIS — M7918 Myalgia, other site: Secondary | ICD-10-CM | POA: Insufficient documentation

## 2012-07-17 DIAGNOSIS — IMO0001 Reserved for inherently not codable concepts without codable children: Secondary | ICD-10-CM

## 2012-07-17 DIAGNOSIS — E785 Hyperlipidemia, unspecified: Secondary | ICD-10-CM

## 2012-07-17 DIAGNOSIS — Z8659 Personal history of other mental and behavioral disorders: Secondary | ICD-10-CM

## 2012-07-17 NOTE — Progress Notes (Signed)
  Subjective:    Patient ID: DELAILAH SPIETH, female    DOB: [birth year], 67 y.o.   MRN: [id number]  HPI Here to follow up on numerous issues. She still suffers with chronic left shoulder pain which has been termed myofascial pain. She saw Dr. Wynn Banker once and he was not sure how to diagnose her problem. He offered her Botox injections but these would have been expensive and of questionable benefit. Instead he has referred her to [facility] in [place]. We tried her on Cymbalta and Effexor for depression and pain modulation, but she could not tolerate either of these because they caused her to have migraine HAs. She continues on Ritalin.    Review of Systems  Constitutional: Negative.   Musculoskeletal: Positive for myalgias.  Neurological: Positive for headaches.  Psychiatric/Behavioral: Positive for dysphoric mood.       Objective:   Physical Exam  Constitutional: She is oriented to person, place, and time. She appears well-developed and well-nourished.  Cardiovascular: Normal rate, regular rhythm, normal heart sounds and intact distal pulses.   Pulmonary/Chest: Effort normal and breath sounds normal.  Neurological: She is alert and oriented to person, place, and time.  Psychiatric: She has a normal mood and affect. Her behavior is normal. Thought content normal.          Assessment & Plan:  She will see the [facility] as above.

## 2012-08-08 ENCOUNTER — Telehealth: Payer: Self-pay | Admitting: Family Medicine

## 2012-08-08 MED ORDER — METHYLPHENIDATE HCL 20 MG PO TABS: 20.0000 mg | ORAL_TABLET | Freq: Three times a day (TID) | ORAL | Status: DC

## 2012-08-08 NOTE — Telephone Encounter (Signed)
Pt needs refill of methylphenidate (RITALIN) 20 MG tablet.  1 /3day.

## 2012-08-08 NOTE — Telephone Encounter (Signed)
See below note.

## 2012-08-08 NOTE — Telephone Encounter (Signed)
done

## 2012-08-09 NOTE — Telephone Encounter (Signed)
Scripts are ready for pick up and I spoke with pt. 

## 2012-08-10 DIAGNOSIS — Z79899 Other long term (current) drug therapy: Secondary | ICD-10-CM | POA: Diagnosis not present

## 2012-08-10 DIAGNOSIS — M546 Pain in thoracic spine: Secondary | ICD-10-CM | POA: Diagnosis not present

## 2012-08-10 DIAGNOSIS — Z5181 Encounter for therapeutic drug level monitoring: Secondary | ICD-10-CM | POA: Diagnosis not present

## 2012-08-10 DIAGNOSIS — IMO0001 Reserved for inherently not codable concepts without codable children: Secondary | ICD-10-CM | POA: Diagnosis not present

## 2012-08-10 DIAGNOSIS — G894 Chronic pain syndrome: Secondary | ICD-10-CM | POA: Diagnosis not present

## 2012-08-23 ENCOUNTER — Other Ambulatory Visit: Payer: Self-pay

## 2012-08-23 DIAGNOSIS — G894 Chronic pain syndrome: Secondary | ICD-10-CM | POA: Diagnosis not present

## 2012-08-23 DIAGNOSIS — M546 Pain in thoracic spine: Secondary | ICD-10-CM | POA: Diagnosis not present

## 2012-08-23 DIAGNOSIS — M25519 Pain in unspecified shoulder: Secondary | ICD-10-CM | POA: Diagnosis not present

## 2012-08-25 DIAGNOSIS — M25519 Pain in unspecified shoulder: Secondary | ICD-10-CM | POA: Diagnosis not present

## 2012-08-25 DIAGNOSIS — G894 Chronic pain syndrome: Secondary | ICD-10-CM | POA: Diagnosis not present

## 2012-08-25 DIAGNOSIS — M546 Pain in thoracic spine: Secondary | ICD-10-CM | POA: Diagnosis not present

## 2012-08-28 DIAGNOSIS — M25519 Pain in unspecified shoulder: Secondary | ICD-10-CM | POA: Diagnosis not present

## 2012-08-28 DIAGNOSIS — M546 Pain in thoracic spine: Secondary | ICD-10-CM | POA: Diagnosis not present

## 2012-08-28 DIAGNOSIS — G894 Chronic pain syndrome: Secondary | ICD-10-CM | POA: Diagnosis not present

## 2012-09-01 DIAGNOSIS — M25519 Pain in unspecified shoulder: Secondary | ICD-10-CM | POA: Diagnosis not present

## 2012-09-01 DIAGNOSIS — G894 Chronic pain syndrome: Secondary | ICD-10-CM | POA: Diagnosis not present

## 2012-09-01 DIAGNOSIS — M546 Pain in thoracic spine: Secondary | ICD-10-CM | POA: Diagnosis not present

## 2012-09-12 DIAGNOSIS — M810 Age-related osteoporosis without current pathological fracture: Secondary | ICD-10-CM | POA: Diagnosis not present

## 2012-09-15 DIAGNOSIS — M810 Age-related osteoporosis without current pathological fracture: Secondary | ICD-10-CM | POA: Diagnosis not present

## 2012-09-15 DIAGNOSIS — E039 Hypothyroidism, unspecified: Secondary | ICD-10-CM | POA: Diagnosis not present

## 2012-09-15 DIAGNOSIS — E559 Vitamin D deficiency, unspecified: Secondary | ICD-10-CM | POA: Diagnosis not present

## 2012-09-19 DIAGNOSIS — G894 Chronic pain syndrome: Secondary | ICD-10-CM | POA: Diagnosis not present

## 2012-09-19 DIAGNOSIS — M546 Pain in thoracic spine: Secondary | ICD-10-CM | POA: Diagnosis not present

## 2012-09-19 DIAGNOSIS — IMO0001 Reserved for inherently not codable concepts without codable children: Secondary | ICD-10-CM | POA: Diagnosis not present

## 2012-09-21 ENCOUNTER — Other Ambulatory Visit (INDEPENDENT_AMBULATORY_CARE_PROVIDER_SITE_OTHER): Payer: Medicare Other

## 2012-09-21 DIAGNOSIS — E785 Hyperlipidemia, unspecified: Secondary | ICD-10-CM

## 2012-09-21 LAB — HEPATIC FUNCTION PANEL
AST: 20 U/L (ref 0–37)
Alkaline Phosphatase: 64 U/L (ref 39–117)
Bilirubin, Direct: 0 mg/dL (ref 0.0–0.3)
Total Bilirubin: 0.4 mg/dL (ref 0.3–1.2)

## 2012-09-21 LAB — LIPID PANEL: Total CHOL/HDL Ratio: 3

## 2012-09-22 NOTE — Progress Notes (Signed)
Quick Note:  I released results in my chart. ______ 

## 2012-09-25 DIAGNOSIS — G43019 Migraine without aura, intractable, without status migrainosus: Secondary | ICD-10-CM | POA: Diagnosis not present

## 2012-09-28 ENCOUNTER — Telehealth: Payer: Self-pay | Admitting: Family Medicine

## 2012-09-28 NOTE — Telephone Encounter (Signed)
Pt would like results of labs done 9/4.

## 2012-09-29 NOTE — Telephone Encounter (Signed)
I spoke with pt and gave results.  

## 2012-10-04 DIAGNOSIS — Z23 Encounter for immunization: Secondary | ICD-10-CM | POA: Diagnosis not present

## 2012-10-04 DIAGNOSIS — Z79899 Other long term (current) drug therapy: Secondary | ICD-10-CM | POA: Diagnosis not present

## 2012-10-04 DIAGNOSIS — M546 Pain in thoracic spine: Secondary | ICD-10-CM | POA: Diagnosis not present

## 2012-10-04 DIAGNOSIS — IMO0001 Reserved for inherently not codable concepts without codable children: Secondary | ICD-10-CM | POA: Diagnosis not present

## 2012-10-12 DIAGNOSIS — M810 Age-related osteoporosis without current pathological fracture: Secondary | ICD-10-CM | POA: Diagnosis not present

## 2012-11-08 ENCOUNTER — Telehealth: Payer: Self-pay | Admitting: Family Medicine

## 2012-11-08 NOTE — Telephone Encounter (Signed)
Pt needs refill of zaleplon (SONATA) 10 MG capsule  2 x day filled 180 tab pls [facility]. Pt is almost out and needs asap. Pt would like a call back to confirm this has been sent. Pt hasnt had [place] [facility] in the past.

## 2012-11-09 NOTE — Telephone Encounter (Signed)
Call in #180 with one rf  

## 2012-11-10 MED ORDER — ZALEPLON 10 MG PO CAPS: 20.0000 mg | ORAL_CAPSULE | Freq: Every day | ORAL | Status: DC

## 2012-11-10 NOTE — Telephone Encounter (Signed)
Pt following up on request. Pt would like to give you the fax no. : [phone]. Pt has never gotten [place] to do it before and she is a little nervous about this request.

## 2012-11-10 NOTE — Telephone Encounter (Signed)
Script was faxed.

## 2013-01-01 ENCOUNTER — Other Ambulatory Visit: Payer: Self-pay | Admitting: Family Medicine

## 2013-01-01 NOTE — Telephone Encounter (Signed)
Call in #90 with 5 rf. Needs testing and a contract

## 2013-01-03 ENCOUNTER — Other Ambulatory Visit: Payer: Self-pay | Admitting: Family Medicine

## 2013-01-03 NOTE — Telephone Encounter (Signed)
Left a message for return call to discuss testing.

## 2013-01-05 NOTE — Telephone Encounter (Signed)
We just refilled this 4 days ago  

## 2013-01-16 DIAGNOSIS — IMO0001 Reserved for inherently not codable concepts without codable children: Secondary | ICD-10-CM | POA: Diagnosis not present

## 2013-01-16 DIAGNOSIS — M546 Pain in thoracic spine: Secondary | ICD-10-CM | POA: Diagnosis not present

## 2013-01-16 DIAGNOSIS — Z79899 Other long term (current) drug therapy: Secondary | ICD-10-CM | POA: Diagnosis not present

## 2013-02-19 ENCOUNTER — Telehealth: Payer: Self-pay | Admitting: Family Medicine

## 2013-02-19 NOTE — Telephone Encounter (Signed)
Pt is calling regarding a letter she received from her insurance company, pt states insurance will no longer pay for rx zaleplon two nightly. Pt needs new rx for 1 nightly 90 day supply-[facility] order- [facility] [phone]

## 2013-02-19 NOTE — Telephone Encounter (Signed)
Change to one capsule qhs, call in #180 with one rf

## 2013-02-19 NOTE — Telephone Encounter (Signed)
See below note.

## 2013-02-21 MED ORDER — ZALEPLON 10 MG PO CAPS: 10.0000 mg | ORAL_CAPSULE | Freq: Every day | ORAL | Status: DC

## 2013-02-21 NOTE — Telephone Encounter (Signed)
Script was faxed and I spoke with pt.

## 2013-04-11 DIAGNOSIS — Z79899 Other long term (current) drug therapy: Secondary | ICD-10-CM | POA: Diagnosis not present

## 2013-04-11 DIAGNOSIS — IMO0001 Reserved for inherently not codable concepts without codable children: Secondary | ICD-10-CM | POA: Diagnosis not present

## 2013-04-11 DIAGNOSIS — M546 Pain in thoracic spine: Secondary | ICD-10-CM | POA: Diagnosis not present

## 2013-05-07 DIAGNOSIS — G43719 Chronic migraine without aura, intractable, without status migrainosus: Secondary | ICD-10-CM | POA: Diagnosis not present

## 2013-05-07 DIAGNOSIS — G43019 Migraine without aura, intractable, without status migrainosus: Secondary | ICD-10-CM | POA: Diagnosis not present

## 2013-05-14 ENCOUNTER — Ambulatory Visit (INDEPENDENT_AMBULATORY_CARE_PROVIDER_SITE_OTHER): Payer: Medicare Other | Admitting: Family Medicine

## 2013-05-14 ENCOUNTER — Encounter: Payer: Self-pay | Admitting: Family Medicine

## 2013-05-14 ENCOUNTER — Telehealth: Payer: Self-pay | Admitting: Family Medicine

## 2013-05-14 VITALS — BP 100/52 | Temp 97.6°F | Ht 64.25 in | Wt 126.0 lb

## 2013-05-14 DIAGNOSIS — F411 Generalized anxiety disorder: Secondary | ICD-10-CM

## 2013-05-14 DIAGNOSIS — E782 Mixed hyperlipidemia: Secondary | ICD-10-CM | POA: Diagnosis not present

## 2013-05-14 DIAGNOSIS — M810 Age-related osteoporosis without current pathological fracture: Secondary | ICD-10-CM

## 2013-05-14 DIAGNOSIS — K589 Irritable bowel syndrome without diarrhea: Secondary | ICD-10-CM

## 2013-05-14 DIAGNOSIS — E039 Hypothyroidism, unspecified: Secondary | ICD-10-CM

## 2013-05-14 DIAGNOSIS — IMO0001 Reserved for inherently not codable concepts without codable children: Secondary | ICD-10-CM

## 2013-05-14 DIAGNOSIS — Z79899 Other long term (current) drug therapy: Secondary | ICD-10-CM | POA: Diagnosis not present

## 2013-05-14 DIAGNOSIS — M7918 Myalgia, other site: Secondary | ICD-10-CM

## 2013-05-14 DIAGNOSIS — E785 Hyperlipidemia, unspecified: Secondary | ICD-10-CM | POA: Diagnosis not present

## 2013-05-14 DIAGNOSIS — G43909 Migraine, unspecified, not intractable, without status migrainosus: Secondary | ICD-10-CM

## 2013-05-14 LAB — CBC WITH DIFFERENTIAL/PLATELET
Basophils Absolute: 0 10*3/uL (ref 0.0–0.1)
Basophils Relative: 0.5 % (ref 0.0–3.0)
Eosinophils Absolute: 0.1 10*3/uL (ref 0.0–0.7)
Eosinophils Relative: 1.9 % (ref 0.0–5.0)
HCT: 41.3 % (ref 36.0–46.0)
Hemoglobin: 13.7 g/dL (ref 12.0–15.0)
Lymphocytes Relative: 38.6 % (ref 12.0–46.0)
Lymphs Abs: 1.9 10*3/uL (ref 0.7–4.0)
MCHC: 33.2 g/dL (ref 30.0–36.0)
MCV: 97.1 fl (ref 78.0–100.0)
Monocytes Absolute: 0.4 10*3/uL (ref 0.1–1.0)
Monocytes Relative: 7.3 % (ref 3.0–12.0)
Neutro Abs: 2.5 10*3/uL (ref 1.4–7.7)
Neutrophils Relative %: 51.7 % (ref 43.0–77.0)
Platelets: 273 10*3/uL (ref 150.0–400.0)
RBC: 4.26 Mil/uL (ref 3.87–5.11)
RDW: 13.4 % (ref 11.5–14.6)
WBC: 4.8 10*3/uL (ref 4.5–10.5)

## 2013-05-14 LAB — BASIC METABOLIC PANEL WITH GFR
BUN: 9 mg/dL (ref 6–23)
CO2: 27 meq/L (ref 19–32)
Calcium: 10 mg/dL (ref 8.4–10.5)
Chloride: 103 meq/L (ref 96–112)
Creatinine, Ser: 0.8 mg/dL (ref 0.4–1.2)
GFR: 75.44 mL/min
Glucose, Bld: 99 mg/dL (ref 70–99)
Potassium: 4.9 meq/L (ref 3.5–5.1)
Sodium: 140 meq/L (ref 135–145)

## 2013-05-14 LAB — POCT URINALYSIS DIPSTICK
Bilirubin, UA: NEGATIVE
Blood, UA: NEGATIVE
Glucose, UA: NEGATIVE
Ketones, UA: NEGATIVE
Leukocytes, UA: NEGATIVE
Nitrite, UA: NEGATIVE
Protein, UA: NEGATIVE
Spec Grav, UA: 1.01
Urobilinogen, UA: 0.2
pH, UA: 7

## 2013-05-14 LAB — LIPID PANEL
Cholesterol: 274 mg/dL — ABNORMAL HIGH (ref 0–200)
HDL: 79.3 mg/dL
LDL Cholesterol: 177 mg/dL — ABNORMAL HIGH (ref 0–99)
Total CHOL/HDL Ratio: 3
Triglycerides: 91 mg/dL (ref 0.0–149.0)
VLDL: 18.2 mg/dL (ref 0.0–40.0)

## 2013-05-14 LAB — HEPATIC FUNCTION PANEL
ALT: 18 U/L (ref 0–35)
AST: 23 U/L (ref 0–37)
Albumin: 4.1 g/dL (ref 3.5–5.2)
Alkaline Phosphatase: 64 U/L (ref 39–117)
Bilirubin, Direct: 0 mg/dL (ref 0.0–0.3)
Total Bilirubin: 0.5 mg/dL (ref 0.3–1.2)
Total Protein: 7 g/dL (ref 6.0–8.3)

## 2013-05-14 LAB — TSH: TSH: 0.91 u[IU]/mL (ref 0.35–5.50)

## 2013-05-14 MED ORDER — METHYLPHENIDATE HCL 20 MG PO TABS: 20.0000 mg | ORAL_TABLET | Freq: Three times a day (TID) | ORAL | Status: DC

## 2013-05-14 MED ORDER — METRONIDAZOLE 0.75 % EX CREA: 1.0000 "application " | TOPICAL_CREAM | Freq: Every day | CUTANEOUS | Status: DC

## 2013-05-14 MED ORDER — OXYCODONE HCL ER 20 MG PO T12A: 20.0000 mg | EXTENDED_RELEASE_TABLET | Freq: Two times a day (BID) | ORAL | Status: DC

## 2013-05-14 MED ORDER — LORAZEPAM 2 MG PO TABS: 2.0000 mg | ORAL_TABLET | Freq: Three times a day (TID) | ORAL | Status: DC | PRN

## 2013-05-14 NOTE — Progress Notes (Signed)
   Subjective:    Patient ID: Linda Kent, female    DOB: 1945-02-05, 68 y.o.   MRN: [id number]  HPI 69 yr old female for a cpx. Her main concern is her chronic pain for myofascial pain syndrome. This is constant and severe, and it dominates her life. She spends most of her time in bed and she requires help from her husband with bathing and dressing. She can no longer drive. She has been seeing Dr. Elta Guadeloupe Philips for this for 4 years but she is unhappy with his care. He has been giving her 3 hydrocodone pills a day and will not try anything else. She is frustrated and asks if we can help.    Review of Systems  Constitutional: Negative.   HENT: Negative.   Eyes: Negative.   Respiratory: Negative.   Cardiovascular: Negative.   Gastrointestinal: Negative.   Genitourinary: Negative for dysuria, urgency, frequency, hematuria, flank pain, decreased urine volume, enuresis, difficulty urinating, pelvic pain and dyspareunia.  Musculoskeletal: Positive for arthralgias and myalgias. Negative for back pain, gait problem, joint swelling, neck pain and neck stiffness.  Skin: Negative.   Neurological: Negative.   Psychiatric/Behavioral: Negative.        Objective:   Physical Exam  Constitutional: She is oriented to person, place, and time. She appears well-developed and well-nourished. No distress.  HENT:  Head: Normocephalic and atraumatic.  Right Ear: External ear normal.  Left Ear: External ear normal.  Nose: Nose normal.  Mouth/Throat: Oropharynx is clear and moist. No oropharyngeal exudate.  Eyes: Conjunctivae and EOM are normal. Pupils are equal, round, and reactive to light. No scleral icterus.  Neck: Normal range of motion. Neck supple. No JVD present. No thyromegaly present.  Cardiovascular: Normal rate, regular rhythm, normal heart sounds and intact distal pulses.  Exam reveals no gallop and no friction rub.   No murmur heard. Pulmonary/Chest: Effort normal and breath sounds normal. No  respiratory distress. She has no wheezes. She has no rales. She exhibits no tenderness.  Abdominal: Soft. Bowel sounds are normal. She exhibits no distension and no mass. There is no tenderness. There is no rebound and no guarding.  Musculoskeletal: Normal range of motion. She exhibits no edema and no tenderness.  Lymphadenopathy:    She has no cervical adenopathy.  Neurological: She is alert and oriented to person, place, and time. She has normal reflexes. No cranial nerve deficit. She exhibits normal muscle tone. Coordination normal.  Skin: Skin is warm and dry. No rash noted. No erythema.  Psychiatric: She has a normal mood and affect. Her behavior is normal. Judgment and thought content normal.          Assessment & Plan:  Well exam. Get fasting labs. We agreed to take over treating her pain syndrome. She will contact Dr. Hardin Negus to tell him that she will no longer be coming to see him, and we will get these records sent to [place]. We will start her on Oxycontin 20 mg bid and she can use Vicodin in between for breakthrough pain.recheck in 3-4 weeks.

## 2013-05-14 NOTE — Progress Notes (Signed)
Pre visit review using our clinic review tool, if applicable. No additional management support is needed unless otherwise documented below in the visit note. 

## 2013-05-14 NOTE — Telephone Encounter (Signed)
Pt said OxyCODONE (OXYCONTIN) 20 mg T12A 12 hr tablet cost 364.00 a month her insurance will not cover it would like to know what else she can get. Would like Sunday Spillers to call her [phone]

## 2013-05-14 NOTE — Telephone Encounter (Signed)
Pt also said she lost a rx while she was here

## 2013-05-14 NOTE — Telephone Encounter (Signed)
Can you find out which medication is the preferred one? Also script was faxed to [facility], the one she lost and I spoke with pt.

## 2013-05-15 NOTE — Telephone Encounter (Signed)
Pt called back stating the medication needed a PA.  I called [facility] and submitted the PA and it was approved till 01/17/14.

## 2013-05-15 NOTE — Telephone Encounter (Signed)
The pt needs to call member services on the back of her card to find out.  I will call and advise her.

## 2013-05-15 NOTE — Telephone Encounter (Signed)
I spoke with pt and she is going to get script filled locally.

## 2013-05-28 ENCOUNTER — Ambulatory Visit (INDEPENDENT_AMBULATORY_CARE_PROVIDER_SITE_OTHER): Payer: Medicare Other | Admitting: Family Medicine

## 2013-05-28 ENCOUNTER — Encounter: Payer: Self-pay | Admitting: Family Medicine

## 2013-05-28 VITALS — BP 97/77 | HR 100 | Temp 98.2°F | Ht 64.25 in | Wt 126.0 lb

## 2013-05-28 DIAGNOSIS — IMO0001 Reserved for inherently not codable concepts without codable children: Secondary | ICD-10-CM | POA: Diagnosis not present

## 2013-05-28 DIAGNOSIS — M7918 Myalgia, other site: Secondary | ICD-10-CM

## 2013-05-28 MED ORDER — OXYCODONE HCL 20 MG PO TABS: 20.0000 mg | ORAL_TABLET | Freq: Four times a day (QID) | ORAL | Status: DC | PRN

## 2013-05-28 NOTE — Progress Notes (Signed)
Pre visit review using our clinic review tool, if applicable. No additional management support is needed unless otherwise documented below in the visit note. 

## 2013-05-28 NOTE — Progress Notes (Signed)
   Subjective:    Patient ID: Linda Kent, female    DOB: 1945-07-20, 68 y.o.   MRN: [id number]  HPI Here to follow up. She has tried Oxycontin 20 mg bid and this helped her pain well for about 3 or 4 hours, but then it wears off and she has to take a Vicodin in between. She is sleeping better at least.    Review of Systems  Constitutional: Negative.   Musculoskeletal: Positive for myalgias.       Objective:   Physical Exam  Constitutional: She appears well-developed and well-nourished.          Assessment & Plan:  Try immediate release oxycodone and dose this every 6 hours.

## 2013-06-07 ENCOUNTER — Encounter: Payer: Self-pay | Admitting: Family Medicine

## 2013-06-25 ENCOUNTER — Telehealth: Payer: Self-pay | Admitting: Family Medicine

## 2013-06-25 NOTE — Telephone Encounter (Signed)
Pt states dr. Sarajane Jews informed her to let him know if the rx  Oxycodone  HCI 20 mg was working for her, pt states it works for her and would like for dr. Sarajane Jews to write her an rx for it.

## 2013-06-26 MED ORDER — OXYCODONE HCL 20 MG PO TABS: 20.0000 mg | ORAL_TABLET | Freq: Four times a day (QID) | ORAL | Status: DC | PRN

## 2013-06-26 MED ORDER — OXYCODONE HCL 20 MG PO TABS
20.0000 mg | ORAL_TABLET | Freq: Four times a day (QID) | ORAL | Status: DC | PRN
Start: 2013-06-26 — End: 2013-06-26

## 2013-06-26 NOTE — Telephone Encounter (Signed)
I spoke with pt  

## 2013-06-26 NOTE — Telephone Encounter (Signed)
Script is ready for pick up and I spoke with pt, she is requesting a 3 month supply.

## 2013-06-26 NOTE — Telephone Encounter (Signed)
done

## 2013-06-26 NOTE — Telephone Encounter (Signed)
Done for 3 months

## 2013-08-03 IMAGING — CR DG CHEST 2V
2 series · 2 of 2 positions shown · non-contrast
Comparison: 03/25/2011 and prior chest radiographs

CLINICAL DATA: 65-year-old female with chest pain and weakness.

CHEST - 2 VIEW

[w chest pa]
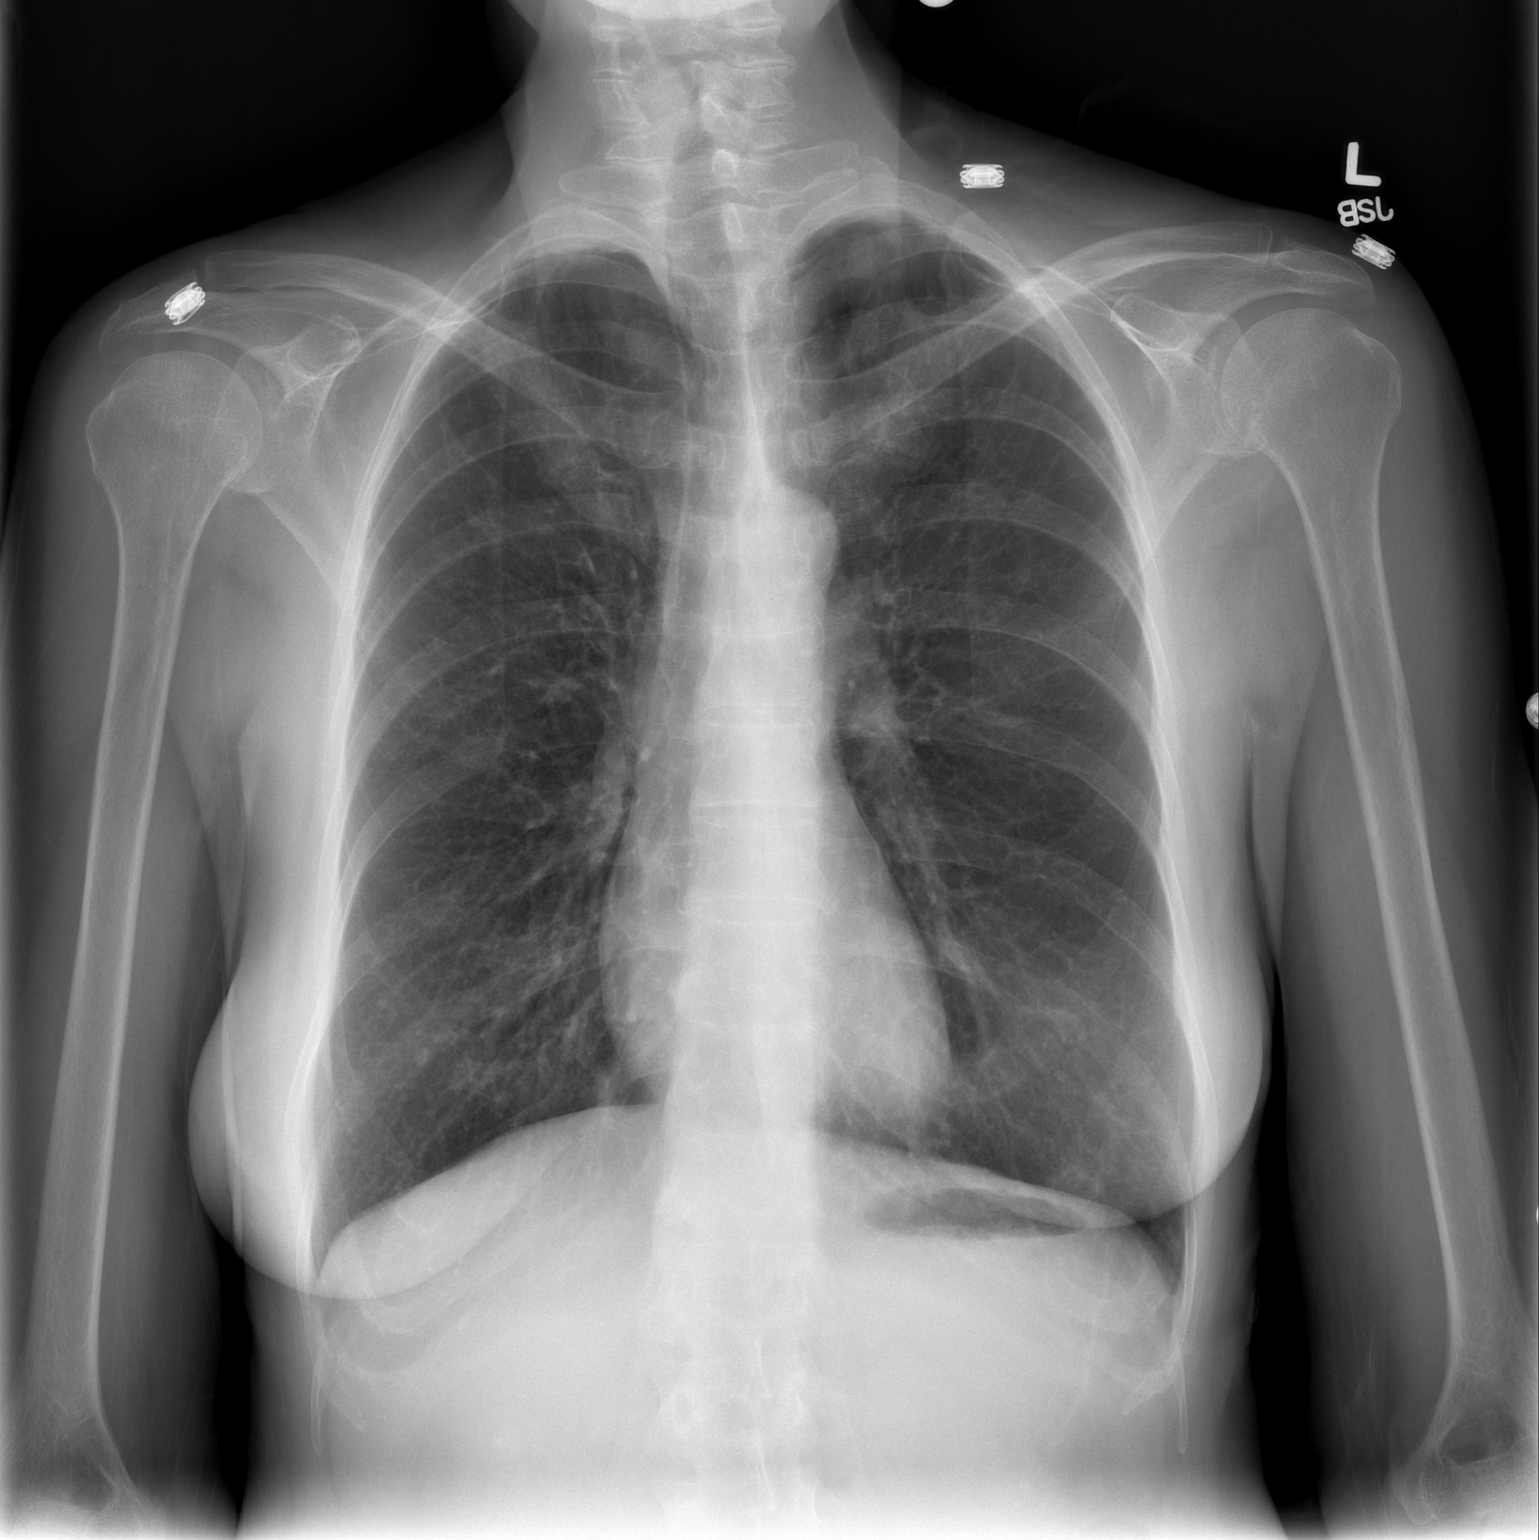

[w chest lat]
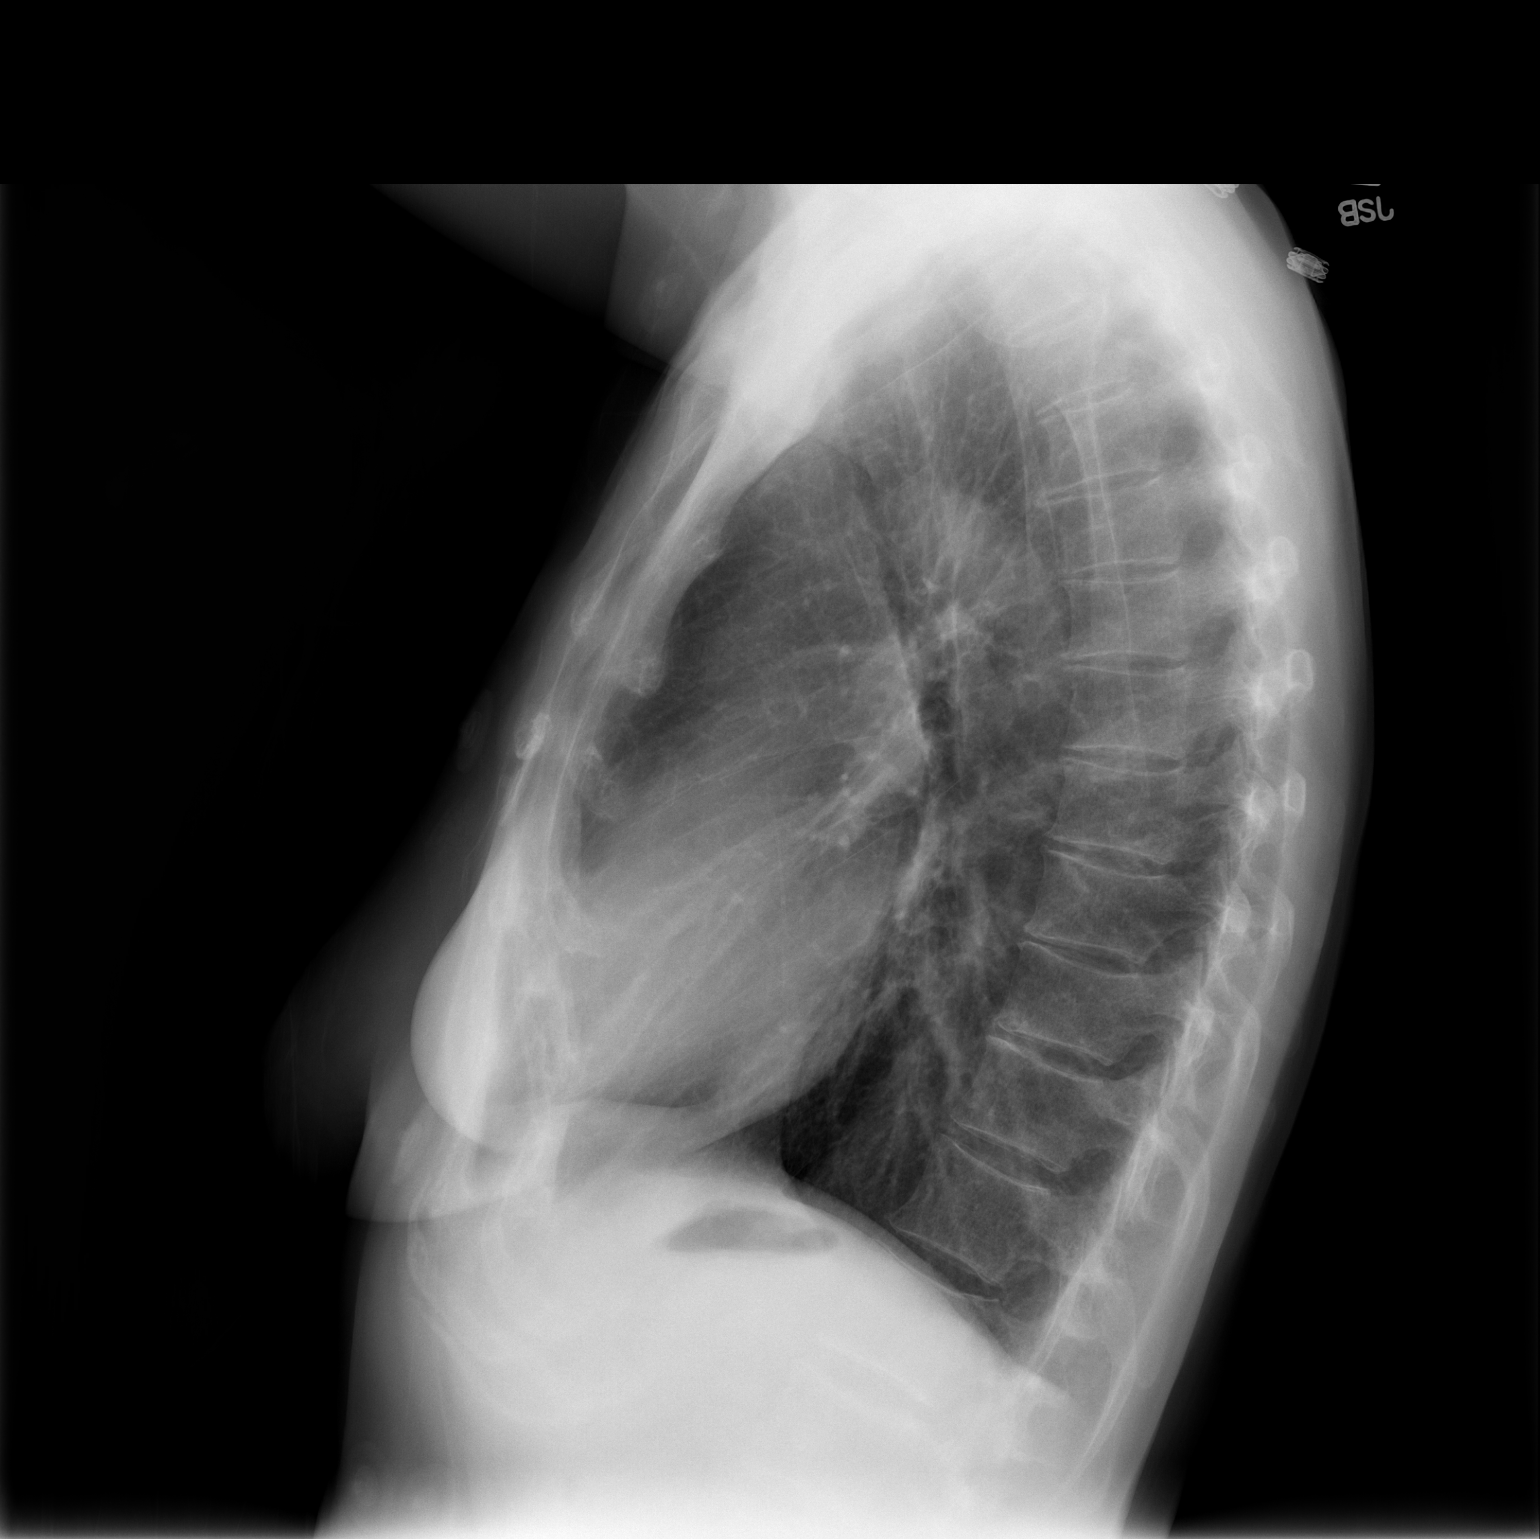

[2 of 2 positions shown; findings below may reference images not displayed]

FINDINGS: The cardiomediastinal silhouette is unremarkable.
Hyperinflation is again noted.
Mild biapical pleuroparenchymal scarring is unchanged.
There is no evidence of focal airspace disease, pulmonary edema,
suspicious pulmonary nodule/mass, pleural effusion, or
pneumothorax.
No acute bony abnormalities are identified.
IMPRESSION: No evidence of acute cardiopulmonary disease.

## 2013-08-03 IMAGING — CT CT ANGIO CHEST
2 of 6 series · 18 of 46 positions shown · IV contrast (omnipaque)
Comparison: None.

CLINICAL DATA: Chest pain

CT ANGIOGRAPHY CHEST
TECHNIQUE: Multidetector CT imaging of the chest using the
standard protocol during bolus administration of intravenous
contrast. Multiplanar reconstructed images including MIPs were
obtained and reviewed to evaluate the vascular anatomy.
Contrast: 85mL OMNIPAQUE IOHEXOL 350 MG/ML SOLN

[Series 6: pulm embolism 1.0 b25f thin · axial · 0.55mm/px · z∈[+1160,+1412]mm · 15 of 278 slices shown]
[im 13/278  lung]
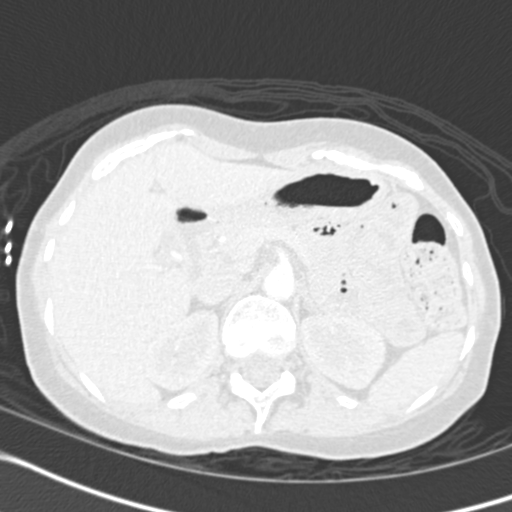
[im 37/278  soft-tissue]
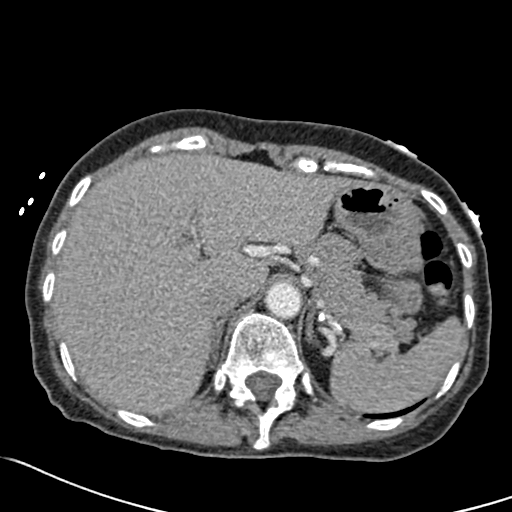
[im 49/278  lung]
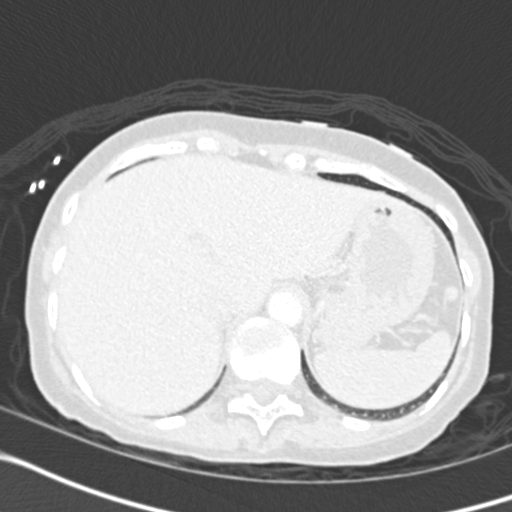
[im 73/278  soft-tissue]
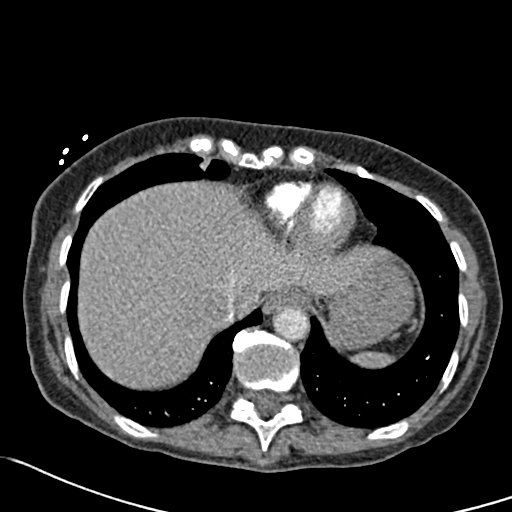
[im 85/278  lung]
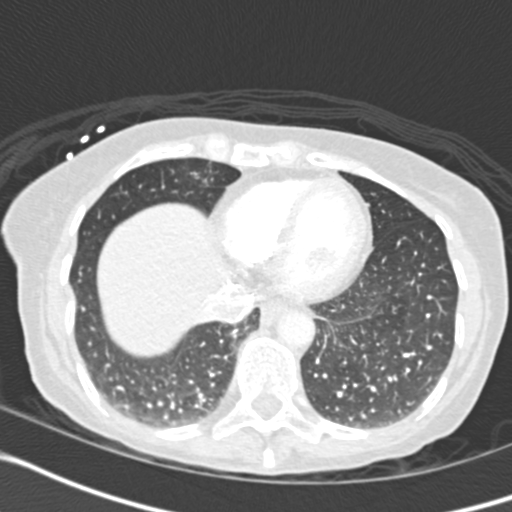
[im 109/278  soft-tissue]
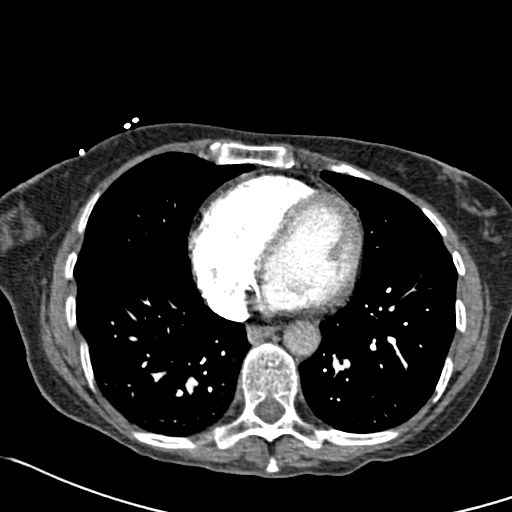
[im 121/278  lung]
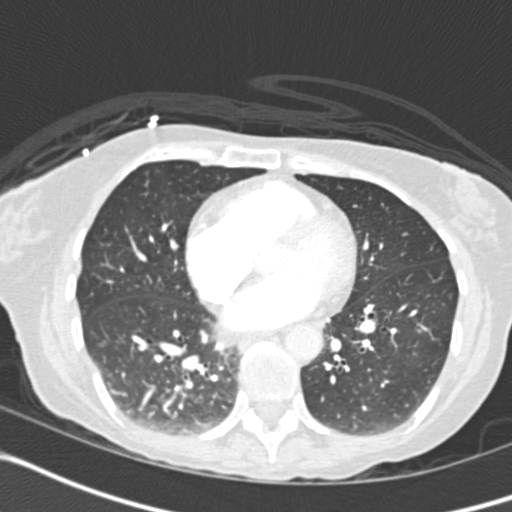
[im 145/278  soft-tissue]
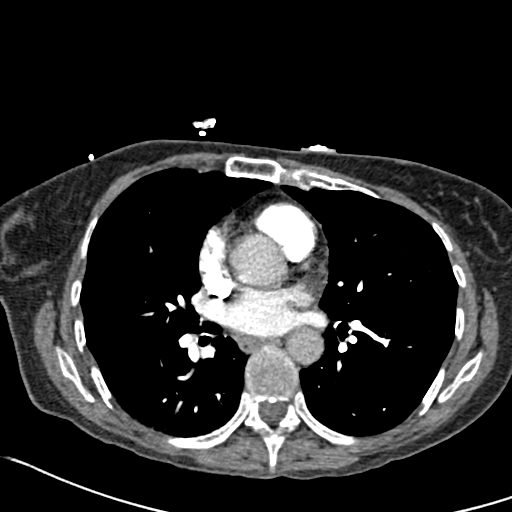
[im 157/278  lung]
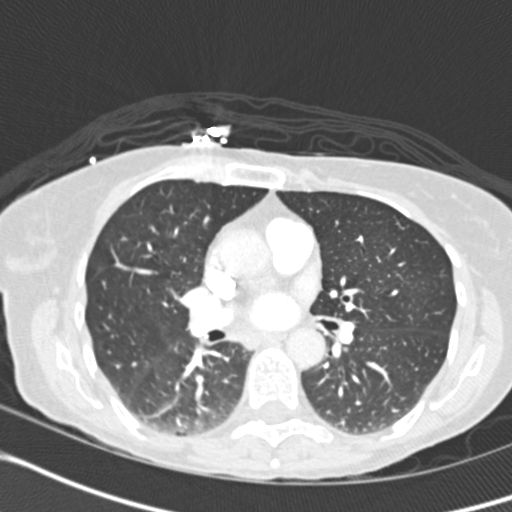
[im 169/278  soft-tissue]
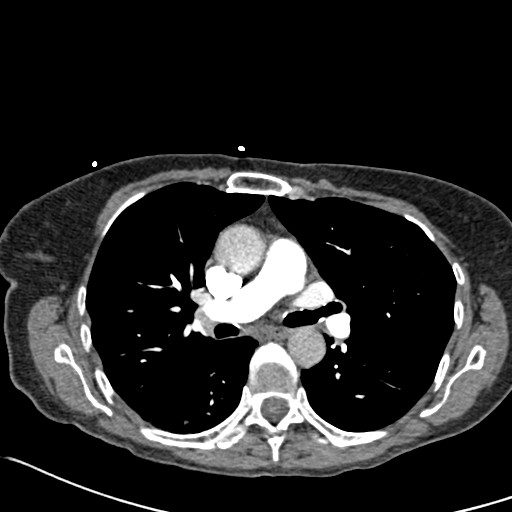
[im 193/278  lung]
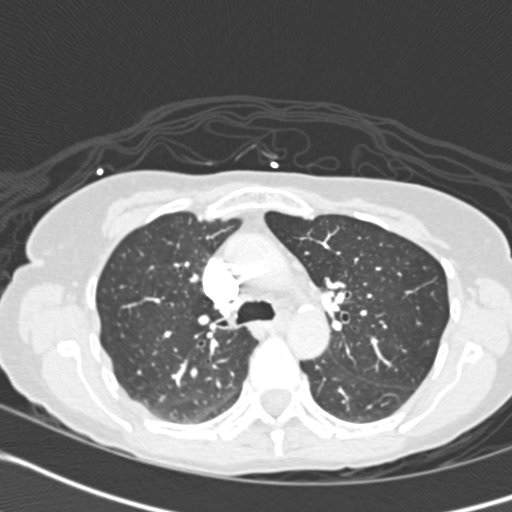
[im 205/278  soft-tissue]
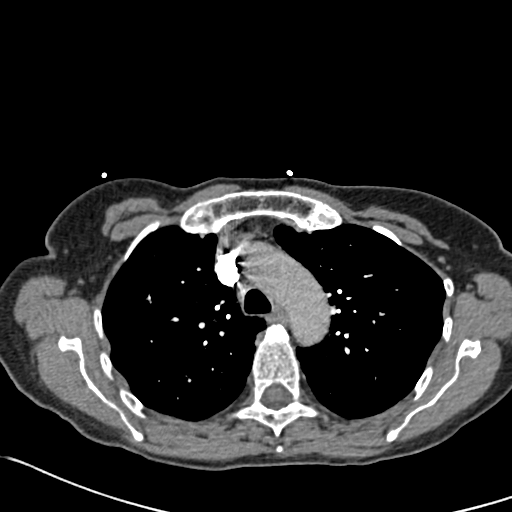
[im 229/278  lung]
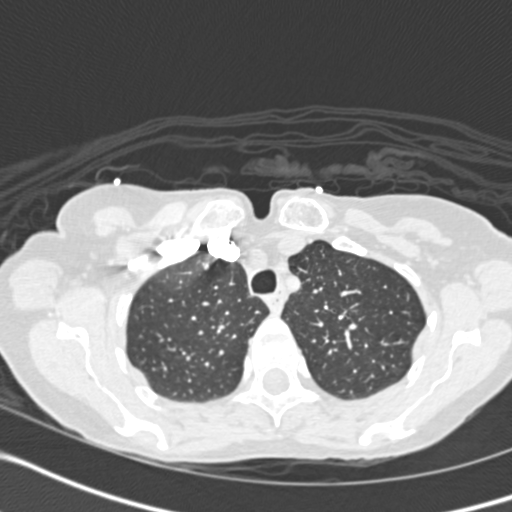
[im 241/278  soft-tissue]
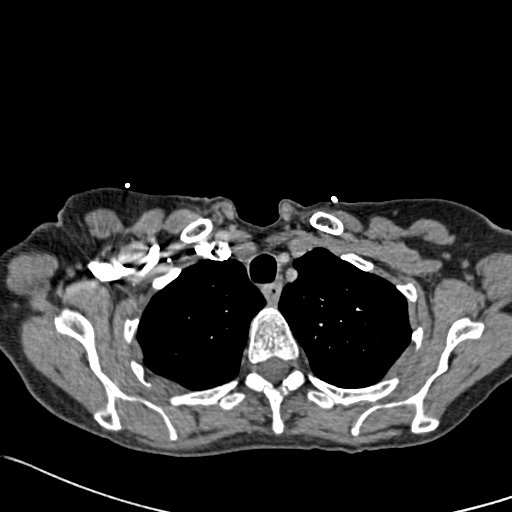
[im 265/278  lung]
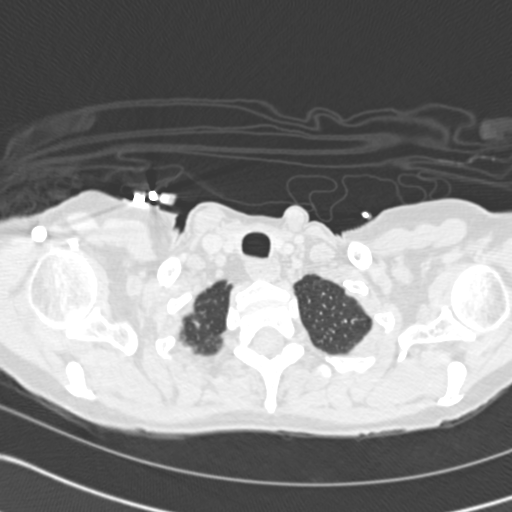

[Series 8: coronals · coronal · 0.53mm/px · 3 of 87 slices shown]
[im 22/87  soft-tissue]
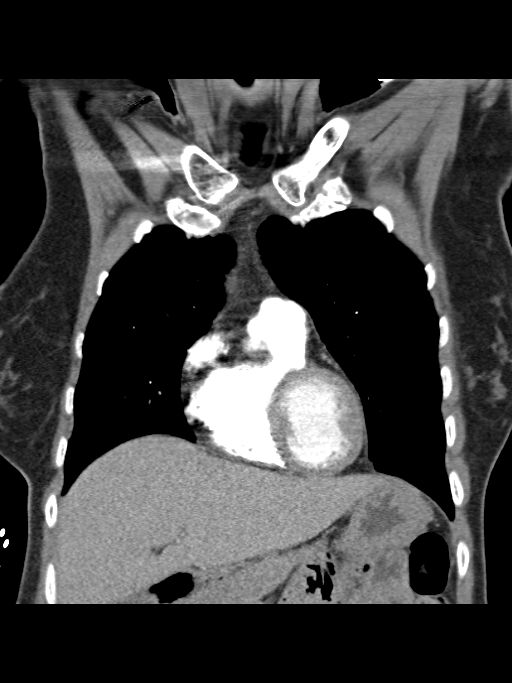
[im 44/87  soft-tissue]
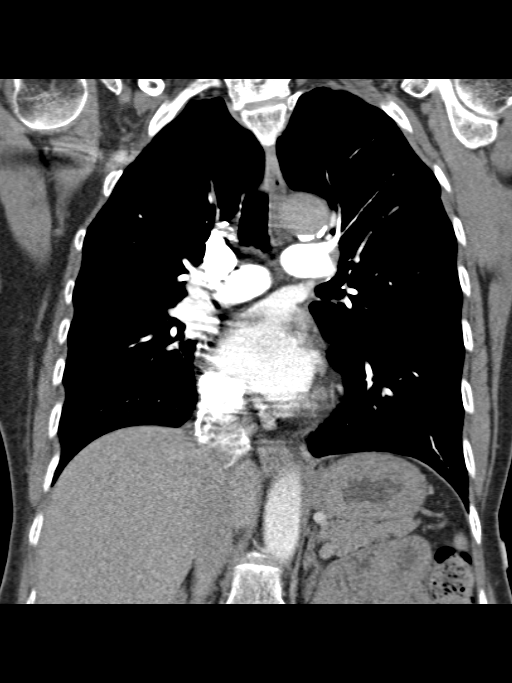
[im 65/87  soft-tissue]
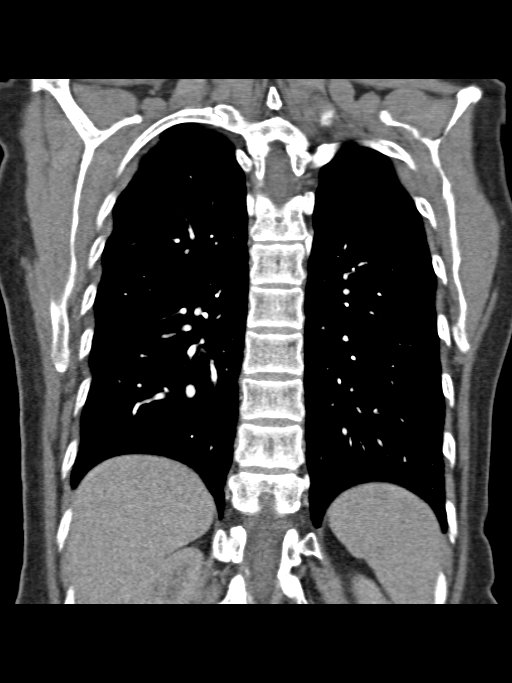

[18 of 46 positions shown; findings below may reference images not displayed]

FINDINGS: There are no filling defects in the pulmonary arterial
tree to suggest acute pulmonary thromboembolism.  No aortic
aneurysm or dissection.  Minimal atherosclerotic changes of the
aorta.

Negative abnormal mediastinal adenopathy

Focal patchy ground-glass in the central right upper lobe on image
26 is nonspecific.  Linear atelectasis at the base of the right
middle lobe.

No destructive bone lesion.
IMPRESSION: No evidence of acute pulmonary thromboembolism.

Minimal focal ground-glass in the right upper lobe is nonspecific.
An inflammatory process is favored.

## 2013-08-21 ENCOUNTER — Telehealth: Payer: Self-pay | Admitting: Family Medicine

## 2013-08-21 NOTE — Telephone Encounter (Signed)
Pt request refill of the following:  zaleplon (SONATA) 10 MG capsule ,   methylphenidate (RITALIN) 20 MG tablet  1 3times a day     Phamacy:     [facility]   FOR  ZALEPLON

## 2013-08-22 MED ORDER — ZALEPLON 10 MG PO CAPS: 10.0000 mg | ORAL_CAPSULE | Freq: Every day | ORAL | Status: DC

## 2013-08-22 MED ORDER — METHYLPHENIDATE HCL 20 MG PO TABS: 20.0000 mg | ORAL_TABLET | Freq: Three times a day (TID) | ORAL | Status: DC

## 2013-08-22 NOTE — Telephone Encounter (Signed)
The rx for Sonata can be faxed. She will pick up the others

## 2013-08-23 NOTE — Telephone Encounter (Signed)
I left message for pt, I faxed script for Sonata and the other script is ready for pick up.

## 2013-09-06 DIAGNOSIS — Z09 Encounter for follow-up examination after completed treatment for conditions other than malignant neoplasm: Secondary | ICD-10-CM | POA: Diagnosis not present

## 2013-09-06 DIAGNOSIS — N6489 Other specified disorders of breast: Secondary | ICD-10-CM | POA: Diagnosis not present

## 2013-09-17 ENCOUNTER — Telehealth: Payer: Self-pay | Admitting: Family Medicine

## 2013-09-17 MED ORDER — OXYCODONE HCL 20 MG PO TABS: 20.0000 mg | ORAL_TABLET | Freq: Four times a day (QID) | ORAL | Status: DC | PRN

## 2013-09-17 NOTE — Telephone Encounter (Signed)
done

## 2013-09-17 NOTE — Telephone Encounter (Signed)
Pt request refill Pt request refill Oxycodone HCl 20 MG TABS  #120 Pt states she gets 3 scripts

## 2013-09-18 NOTE — Telephone Encounter (Signed)
Script is ready for pick up and I spoke with pt.  

## 2013-09-19 ENCOUNTER — Ambulatory Visit: Payer: Medicare Other | Admitting: Family Medicine

## 2013-09-20 ENCOUNTER — Encounter: Payer: Self-pay | Admitting: Family Medicine

## 2013-10-22 ENCOUNTER — Ambulatory Visit (INDEPENDENT_AMBULATORY_CARE_PROVIDER_SITE_OTHER): Payer: Medicare Other | Admitting: Family Medicine

## 2013-10-22 ENCOUNTER — Encounter: Payer: Self-pay | Admitting: Family Medicine

## 2013-10-22 VITALS — BP 98/77 | HR 80 | Temp 97.7°F | Ht 64.25 in | Wt 122.0 lb

## 2013-10-22 DIAGNOSIS — M791 Myalgia: Secondary | ICD-10-CM | POA: Diagnosis not present

## 2013-10-22 DIAGNOSIS — Z23 Encounter for immunization: Secondary | ICD-10-CM

## 2013-10-22 DIAGNOSIS — F329 Major depressive disorder, single episode, unspecified: Secondary | ICD-10-CM | POA: Diagnosis not present

## 2013-10-22 DIAGNOSIS — F32A Depression, unspecified: Secondary | ICD-10-CM

## 2013-10-22 DIAGNOSIS — M7918 Myalgia, other site: Secondary | ICD-10-CM

## 2013-10-22 MED ORDER — HYDROCODONE-ACETAMINOPHEN 10-325 MG PO TABS: 1.0000 | ORAL_TABLET | Freq: Four times a day (QID) | ORAL | Status: DC | PRN

## 2013-10-22 NOTE — Progress Notes (Signed)
Pre visit review using our clinic review tool, if applicable. No additional management support is needed unless otherwise documented below in the visit note. 

## 2013-10-22 NOTE — Progress Notes (Signed)
   Subjective:    Patient ID: Linda Kent, female    DOB: [birth year], 68 y.o.   MRN: [id number]  HPI Here to follow up. She is doing well in general, and the oxycodone has been helping a lot with her pain on typical days. However on days when she has to go out to a doctor appt, she has flares of pain which break through. She has used Vicodin for these times in the past and asks if she can continue to do so. She would only need to take 2 or 3 a week on average.    Review of Systems  Constitutional: Positive for fatigue.  Respiratory: Negative.   Cardiovascular: Negative.   Musculoskeletal: Positive for myalgias.  Psychiatric/Behavioral: Negative.        Objective:   Physical Exam  Constitutional: She is oriented to person, place, and time. She appears well-developed and well-nourished.  Cardiovascular: Normal rate, regular rhythm, normal heart sounds and intact distal pulses.   Pulmonary/Chest: Effort normal and breath sounds normal.  Neurological: She is alert and oriented to person, place, and time.  Psychiatric: She has a normal mood and affect. Her behavior is normal. Thought content normal.          Assessment & Plan:  We will refill the Vicodin to use for break through pains. Her moods are stable.

## 2013-11-01 DIAGNOSIS — E039 Hypothyroidism, unspecified: Secondary | ICD-10-CM | POA: Diagnosis not present

## 2013-11-01 DIAGNOSIS — E559 Vitamin D deficiency, unspecified: Secondary | ICD-10-CM | POA: Diagnosis not present

## 2013-11-01 DIAGNOSIS — M810 Age-related osteoporosis without current pathological fracture: Secondary | ICD-10-CM | POA: Diagnosis not present

## 2013-12-18 ENCOUNTER — Telehealth: Payer: Self-pay | Admitting: Family Medicine

## 2013-12-18 MED ORDER — METHYLPHENIDATE HCL 20 MG PO TABS: 20.0000 mg | ORAL_TABLET | Freq: Three times a day (TID) | ORAL | Status: DC

## 2013-12-18 MED ORDER — OXYCODONE HCL 20 MG PO TABS: 20.0000 mg | ORAL_TABLET | Freq: Four times a day (QID) | ORAL | Status: DC | PRN

## 2013-12-18 NOTE — Telephone Encounter (Signed)
done

## 2013-12-18 NOTE — Telephone Encounter (Signed)
Pt needs new rx for oxycodone 20 mg and generic ritalin 20 mg . Pt needs rxs for dec,jan and feb 2016.

## 2013-12-19 NOTE — Telephone Encounter (Signed)
Scripts are ready for pick up and I left a voice message for pt. 

## 2014-01-01 ENCOUNTER — Telehealth: Payer: Self-pay | Admitting: Family Medicine

## 2014-01-01 NOTE — Telephone Encounter (Signed)
Patient needs re-fill on zaleplon (SONATA) 10 MG capsule. Patient would like to know when the RX is sent.  [place], [place] - [address]

## 2014-01-02 NOTE — Telephone Encounter (Signed)
I spoke with pt and she does have refills.

## 2014-01-02 NOTE — Telephone Encounter (Signed)
NO she is not due until 02-22-14

## 2014-02-13 ENCOUNTER — Telehealth: Payer: Self-pay | Admitting: Family Medicine

## 2014-02-13 NOTE — Telephone Encounter (Signed)
Pt need zaleplon 10 mg #90  W.refills [facility]

## 2014-02-19 MED ORDER — ZALEPLON 10 MG PO CAPS: 10.0000 mg | ORAL_CAPSULE | Freq: Every day | ORAL | Status: DC

## 2014-02-19 NOTE — Telephone Encounter (Signed)
Ready to be faxed.

## 2014-02-19 NOTE — Telephone Encounter (Signed)
Script was faxed to [facility].

## 2014-03-18 ENCOUNTER — Telehealth: Payer: Self-pay | Admitting: Family Medicine

## 2014-03-18 NOTE — Telephone Encounter (Signed)
Pt request refill of the following: Oxycodone HCl 20 MG TABS pt said rx should say take 1 4 times a day   methylphenidate (RITALIN) 20 MG tablet   Pt req 90 day supply

## 2014-03-19 MED ORDER — METHYLPHENIDATE HCL 20 MG PO TABS: 20.0000 mg | ORAL_TABLET | Freq: Three times a day (TID) | ORAL | Status: DC

## 2014-03-19 MED ORDER — OXYCODONE HCL 20 MG PO TABS: 20.0000 mg | ORAL_TABLET | Freq: Four times a day (QID) | ORAL | Status: DC | PRN

## 2014-03-19 NOTE — Telephone Encounter (Signed)
done

## 2014-03-19 NOTE — Telephone Encounter (Signed)
rx's up front ready for p/u, pt aware

## 2014-04-09 DIAGNOSIS — Z79899 Other long term (current) drug therapy: Secondary | ICD-10-CM | POA: Diagnosis not present

## 2014-04-09 DIAGNOSIS — R51 Headache: Secondary | ICD-10-CM | POA: Diagnosis not present

## 2014-04-09 DIAGNOSIS — G43719 Chronic migraine without aura, intractable, without status migrainosus: Secondary | ICD-10-CM | POA: Diagnosis not present

## 2014-04-09 DIAGNOSIS — G43019 Migraine without aura, intractable, without status migrainosus: Secondary | ICD-10-CM | POA: Diagnosis not present

## 2014-04-10 DIAGNOSIS — R51 Headache: Secondary | ICD-10-CM | POA: Diagnosis not present

## 2014-04-10 DIAGNOSIS — G43719 Chronic migraine without aura, intractable, without status migrainosus: Secondary | ICD-10-CM | POA: Diagnosis not present

## 2014-04-10 DIAGNOSIS — M791 Myalgia: Secondary | ICD-10-CM | POA: Diagnosis not present

## 2014-04-10 DIAGNOSIS — G518 Other disorders of facial nerve: Secondary | ICD-10-CM | POA: Diagnosis not present

## 2014-04-10 DIAGNOSIS — M542 Cervicalgia: Secondary | ICD-10-CM | POA: Diagnosis not present

## 2014-04-10 DIAGNOSIS — G43019 Migraine without aura, intractable, without status migrainosus: Secondary | ICD-10-CM | POA: Diagnosis not present

## 2014-05-16 ENCOUNTER — Encounter: Payer: Medicare Other | Admitting: Family Medicine

## 2014-06-05 ENCOUNTER — Encounter: Payer: Self-pay | Admitting: Family Medicine

## 2014-06-05 ENCOUNTER — Ambulatory Visit (INDEPENDENT_AMBULATORY_CARE_PROVIDER_SITE_OTHER): Payer: Medicare Other | Admitting: Family Medicine

## 2014-06-05 VITALS — BP 98/76 | HR 78 | Ht 64.25 in | Wt 112.0 lb

## 2014-06-05 DIAGNOSIS — E782 Mixed hyperlipidemia: Secondary | ICD-10-CM

## 2014-06-05 DIAGNOSIS — M791 Myalgia: Secondary | ICD-10-CM

## 2014-06-05 DIAGNOSIS — G4700 Insomnia, unspecified: Secondary | ICD-10-CM | POA: Diagnosis not present

## 2014-06-05 DIAGNOSIS — E039 Hypothyroidism, unspecified: Secondary | ICD-10-CM

## 2014-06-05 DIAGNOSIS — M7918 Myalgia, other site: Secondary | ICD-10-CM

## 2014-06-05 LAB — BASIC METABOLIC PANEL
BUN: 19 mg/dL (ref 6–23)
CO2: 27 meq/L (ref 19–32)
CREATININE: 0.88 mg/dL (ref 0.40–1.20)
Calcium: 9.3 mg/dL (ref 8.4–10.5)
Chloride: 104 mEq/L (ref 96–112)
GFR: 67.37 mL/min (ref >60.00)
GLUCOSE: 93 mg/dL (ref 70–99)
Potassium: 3.9 mEq/L (ref 3.5–5.1)
Sodium: 136 mEq/L (ref 135–145)

## 2014-06-05 LAB — POCT URINALYSIS DIPSTICK
Bilirubin, UA: NEGATIVE
Blood, UA: NEGATIVE
Glucose, UA: NEGATIVE
Ketones, UA: NEGATIVE
LEUKOCYTES UA: NEGATIVE
Nitrite, UA: NEGATIVE
PROTEIN UA: NEGATIVE
SPEC GRAV UA: 1.015
UROBILINOGEN UA: 0.2
pH, UA: 6

## 2014-06-05 LAB — HEPATIC FUNCTION PANEL
ALT: 9 U/L (ref 0–35)
AST: 16 U/L (ref 0–37)
Albumin: 4 g/dL (ref 3.5–5.2)
Alkaline Phosphatase: 69 U/L (ref 39–117)
Bilirubin, Direct: 0.1 mg/dL (ref 0.0–0.3)
Total Bilirubin: 0.4 mg/dL (ref 0.2–1.2)
Total Protein: 6.7 g/dL (ref 6.0–8.3)

## 2014-06-05 LAB — CBC WITH DIFFERENTIAL/PLATELET
BASOS ABS: 0 10*3/uL (ref 0.0–0.1)
BASOS PCT: 0.6 % (ref 0.0–3.0)
EOS ABS: 0.1 10*3/uL (ref 0.0–0.7)
Eosinophils Relative: 2.2 % (ref 0.0–5.0)
HCT: 40.2 % (ref 36.0–46.0)
HEMOGLOBIN: 13.6 g/dL (ref 12.0–15.0)
Lymphocytes Relative: 39.7 % (ref 12.0–46.0)
Lymphs Abs: 1.9 10*3/uL (ref 0.7–4.0)
MCHC: 33.8 g/dL (ref 30.0–36.0)
MCV: 93 fl (ref 78.0–100.0)
Monocytes Absolute: 0.3 10*3/uL (ref 0.1–1.0)
Monocytes Relative: 6.7 % (ref 3.0–12.0)
NEUTROS PCT: 50.8 % (ref 43.0–77.0)
Neutro Abs: 2.5 10*3/uL (ref 1.4–7.7)
Platelets: 272 10*3/uL (ref 150.0–400.0)
RBC: 4.32 Mil/uL (ref 3.87–5.11)
RDW: 12.9 % (ref 11.5–15.5)
WBC: 4.9 10*3/uL (ref 4.0–10.5)

## 2014-06-05 LAB — LIPID PANEL
Cholesterol: 239 mg/dL — ABNORMAL HIGH (ref 0–200)
HDL: 61.6 mg/dL (ref >39.00)
LDL Cholesterol: 146 mg/dL — ABNORMAL HIGH (ref 0–99)
NONHDL: 177.4
Total CHOL/HDL Ratio: 4
Triglycerides: 157 mg/dL — ABNORMAL HIGH (ref 0.0–149.0)
VLDL: 31.4 mg/dL (ref 0.0–40.0)

## 2014-06-05 LAB — TSH: TSH: 0.36 u[IU]/mL (ref 0.35–4.50)

## 2014-06-05 MED ORDER — METHYLPHENIDATE HCL 20 MG PO TABS: 20.0000 mg | ORAL_TABLET | Freq: Three times a day (TID) | ORAL | Status: DC

## 2014-06-05 MED ORDER — CLOBETASOL PROPIONATE 0.05 % EX CREA: 1.0000 "application " | TOPICAL_CREAM | Freq: Two times a day (BID) | CUTANEOUS | Status: DC

## 2014-06-05 MED ORDER — LORAZEPAM 2 MG PO TABS: 2.0000 mg | ORAL_TABLET | Freq: Three times a day (TID) | ORAL | Status: DC | PRN

## 2014-06-05 MED ORDER — OXYCODONE HCL 20 MG PO TABS: 20.0000 mg | ORAL_TABLET | Freq: Four times a day (QID) | ORAL | Status: DC | PRN

## 2014-06-05 MED ORDER — METRONIDAZOLE 0.75 % EX CREA: 1.0000 "application " | TOPICAL_CREAM | Freq: Every day | CUTANEOUS | Status: DC

## 2014-06-05 MED ORDER — HYDROCODONE-ACETAMINOPHEN 10-325 MG PO TABS: 1.0000 | ORAL_TABLET | Freq: Four times a day (QID) | ORAL | Status: DC | PRN

## 2014-06-05 NOTE — Progress Notes (Signed)
   Subjective:    Patient ID: Linda Kent, female    DOB: 18-Dec-1945, 69 y.o.   MRN: [id number]  HPI 69 yr old female for a check up. She is doing well considering her chronic pain syndrome. She is still very pleased with the pain control she gets from oxycodone, even though she still has limits to what she can do. Her BP is stable. Her anxiety is stable. Her depression is well controlled.    Review of Systems  Constitutional: Negative.   HENT: Negative.   Eyes: Negative.   Respiratory: Negative.   Cardiovascular: Negative.   Gastrointestinal: Negative.   Genitourinary: Negative for dysuria, urgency, frequency, hematuria, flank pain, decreased urine volume, enuresis, difficulty urinating, pelvic pain and dyspareunia.  Musculoskeletal: Negative.   Skin: Negative.   Neurological: Negative.   Psychiatric/Behavioral: Negative.        Objective:   Physical Exam  Constitutional: She is oriented to person, place, and time. She appears well-developed and well-nourished. No distress.  HENT:  Head: Normocephalic and atraumatic.  Right Ear: External ear normal.  Left Ear: External ear normal.  Nose: Nose normal.  Mouth/Throat: Oropharynx is clear and moist. No oropharyngeal exudate.  Eyes: Conjunctivae and EOM are normal. Pupils are equal, round, and reactive to light. No scleral icterus.  Neck: Normal range of motion. Neck supple. No JVD present. No thyromegaly present.  Cardiovascular: Normal rate, regular rhythm, normal heart sounds and intact distal pulses.  Exam reveals no gallop and no friction rub.   No murmur heard. Pulmonary/Chest: Effort normal and breath sounds normal. No respiratory distress. She has no wheezes. She has no rales. She exhibits no tenderness.  Abdominal: Soft. Bowel sounds are normal. She exhibits no distension and no mass. There is no tenderness. There is no rebound and no guarding.  Musculoskeletal: Normal range of motion. She exhibits no edema or tenderness.   Lymphadenopathy:    She has no cervical adenopathy.  Neurological: She is alert and oriented to person, place, and time. She has normal reflexes. No cranial nerve deficit. She exhibits normal muscle tone. Coordination normal.  Skin: Skin is warm and dry. No rash noted. No erythema.  Psychiatric: She has a normal mood and affect. Her behavior is normal. Judgment and thought content normal.          Assessment & Plan:  Her chronic issues like myofascial pain, anxiety and depression are stable. All her meds were refilled. She will get fasting labs today

## 2014-06-05 NOTE — Progress Notes (Signed)
Pre visit review using our clinic review tool, if applicable. No additional management support is needed unless otherwise documented below in the visit note. 

## 2014-07-15 ENCOUNTER — Other Ambulatory Visit: Payer: Self-pay

## 2014-07-23 ENCOUNTER — Telehealth: Payer: Self-pay | Admitting: Family Medicine

## 2014-07-23 MED ORDER — ZALEPLON 10 MG PO CAPS: 10.0000 mg | ORAL_CAPSULE | Freq: Every day | ORAL | Status: DC

## 2014-07-23 NOTE — Telephone Encounter (Signed)
Pt request refill of the following: zaleplon (SONATA) 10 MG capsule   90 day supply   Phamacy: [facility]

## 2014-07-23 NOTE — Telephone Encounter (Signed)
Ready to be faxed to [facility]

## 2014-07-24 NOTE — Telephone Encounter (Signed)
Script was faxed.

## 2014-07-30 ENCOUNTER — Telehealth: Payer: Self-pay | Admitting: *Deleted

## 2014-07-30 NOTE — Telephone Encounter (Signed)
[facility] faxed a request for a refill on a compounded medication: DI3/BAC2/CYC2/GABA6/TETR2.  I called [facility] and the pharmacist stated this was prescribed by Dr Sarajane Jews in April 2015.

## 2014-07-30 NOTE — Telephone Encounter (Signed)
Please refill this for one year  

## 2014-07-31 MED ORDER — NONFORMULARY OR COMPOUNDED ITEM: Status: DC

## 2014-07-31 NOTE — Telephone Encounter (Signed)
Script was printed and faxed to [facility].

## 2014-09-16 ENCOUNTER — Telehealth: Payer: Self-pay | Admitting: Family Medicine

## 2014-09-16 DIAGNOSIS — Z1231 Encounter for screening mammogram for malignant neoplasm of breast: Secondary | ICD-10-CM | POA: Diagnosis not present

## 2014-09-16 DIAGNOSIS — M810 Age-related osteoporosis without current pathological fracture: Secondary | ICD-10-CM | POA: Diagnosis not present

## 2014-09-16 LAB — HM MAMMOGRAPHY

## 2014-09-16 NOTE — Telephone Encounter (Signed)
Pt request refill of the following: Oxycodone HCl 20 MG TABS , methylphenidate (RITALIN) 20 MG tablet   Phamacy:

## 2014-09-17 MED ORDER — METHYLPHENIDATE HCL 20 MG PO TABS: 20.0000 mg | ORAL_TABLET | Freq: Three times a day (TID) | ORAL | Status: DC

## 2014-09-17 MED ORDER — OXYCODONE HCL 20 MG PO TABS: 20.0000 mg | ORAL_TABLET | Freq: Four times a day (QID) | ORAL | Status: DC | PRN

## 2014-09-17 NOTE — Telephone Encounter (Signed)
Scripts are ready for pick up and I left a voice message. 

## 2014-09-17 NOTE — Telephone Encounter (Signed)
done

## 2014-09-18 ENCOUNTER — Encounter: Payer: Self-pay | Admitting: Family Medicine

## 2014-10-02 DIAGNOSIS — Z23 Encounter for immunization: Secondary | ICD-10-CM | POA: Diagnosis not present

## 2014-10-02 DIAGNOSIS — M810 Age-related osteoporosis without current pathological fracture: Secondary | ICD-10-CM | POA: Diagnosis not present

## 2014-10-02 DIAGNOSIS — E039 Hypothyroidism, unspecified: Secondary | ICD-10-CM | POA: Diagnosis not present

## 2014-10-02 DIAGNOSIS — E559 Vitamin D deficiency, unspecified: Secondary | ICD-10-CM | POA: Diagnosis not present

## 2014-10-07 ENCOUNTER — Telehealth: Payer: Self-pay | Admitting: Family Medicine

## 2014-10-07 NOTE — Telephone Encounter (Signed)
Pt request refill of the following: zaleplon (SONATA) 10 MG capsule   Phamacy: [facility]

## 2014-10-10 MED ORDER — ZALEPLON 10 MG PO CAPS: 10.0000 mg | ORAL_CAPSULE | Freq: Every day | ORAL | Status: DC

## 2014-10-10 NOTE — Telephone Encounter (Signed)
Script was printed and faxed to [facility]. 

## 2014-10-10 NOTE — Telephone Encounter (Signed)
Per Dr. Sarajane Jews okay to print and fax to mail order.

## 2014-10-10 NOTE — Telephone Encounter (Signed)
I left a voice message for pt to return my call, script was recently printed, just want to confirm what pharmacy she will be using now?

## 2014-10-22 DIAGNOSIS — M810 Age-related osteoporosis without current pathological fracture: Secondary | ICD-10-CM | POA: Diagnosis not present

## 2014-10-30 DIAGNOSIS — G43019 Migraine without aura, intractable, without status migrainosus: Secondary | ICD-10-CM | POA: Diagnosis not present

## 2014-10-30 DIAGNOSIS — G43719 Chronic migraine without aura, intractable, without status migrainosus: Secondary | ICD-10-CM | POA: Diagnosis not present

## 2014-12-16 ENCOUNTER — Telehealth: Payer: Self-pay | Admitting: Family Medicine

## 2014-12-16 NOTE — Telephone Encounter (Signed)
Pt request refill of the following: Oxycodone HCl 20 MG TABS,  methylphenidate (RITALIN) 20 MG tablet   Pt said she need the above refills for Dec,Jan, Feb   Phamacy:

## 2014-12-17 MED ORDER — METHYLPHENIDATE HCL 20 MG PO TABS: 20.0000 mg | ORAL_TABLET | Freq: Three times a day (TID) | ORAL | Status: DC

## 2014-12-17 MED ORDER — OXYCODONE HCL 20 MG PO TABS: 20.0000 mg | ORAL_TABLET | Freq: Four times a day (QID) | ORAL | Status: DC | PRN

## 2014-12-17 NOTE — Telephone Encounter (Signed)
Script is ready for pick up and I spoke with pt.  

## 2014-12-17 NOTE — Telephone Encounter (Signed)
done

## 2015-01-15 ENCOUNTER — Encounter: Payer: Self-pay | Admitting: *Deleted

## 2015-01-19 HISTORY — PX: COLONOSCOPY: SHX174

## 2015-03-14 ENCOUNTER — Telehealth: Payer: Self-pay | Admitting: Family Medicine

## 2015-03-14 MED ORDER — OXYCODONE HCL 20 MG PO TABS: 20.0000 mg | ORAL_TABLET | Freq: Four times a day (QID) | ORAL | Status: DC | PRN

## 2015-03-14 MED ORDER — METHYLPHENIDATE HCL 20 MG PO TABS: 20.0000 mg | ORAL_TABLET | Freq: Three times a day (TID) | ORAL | Status: DC

## 2015-03-14 NOTE — Telephone Encounter (Signed)
done

## 2015-03-14 NOTE — Telephone Encounter (Signed)
Pt needs new rxs ox;ycodone 20 mg # 120  And generic ritalin 20 mg #90

## 2015-03-14 NOTE — Telephone Encounter (Signed)
Script is ready for pick up and I spoke with pt.  

## 2015-03-21 ENCOUNTER — Telehealth: Payer: Self-pay | Admitting: Family Medicine

## 2015-03-21 MED ORDER — ZALEPLON 10 MG PO CAPS: 10.0000 mg | ORAL_CAPSULE | Freq: Every day | ORAL | Status: DC

## 2015-03-21 NOTE — Telephone Encounter (Signed)
Script was faxed.

## 2015-03-21 NOTE — Telephone Encounter (Signed)
Ready to fax  

## 2015-03-21 NOTE — Telephone Encounter (Signed)
Pt request refill  zaleplon (SONATA) 10 MG capsule  90 day [facility]  Pt would like to know when this has been done.

## 2015-04-14 ENCOUNTER — Ambulatory Visit (INDEPENDENT_AMBULATORY_CARE_PROVIDER_SITE_OTHER): Payer: Medicare Other | Admitting: Family Medicine

## 2015-04-14 ENCOUNTER — Encounter: Payer: Self-pay | Admitting: Family Medicine

## 2015-04-14 VITALS — BP 98/70 | HR 72 | Temp 98.3°F | Ht 64.25 in | Wt 128.0 lb

## 2015-04-14 DIAGNOSIS — M7918 Myalgia, other site: Secondary | ICD-10-CM

## 2015-04-14 DIAGNOSIS — M791 Myalgia: Secondary | ICD-10-CM | POA: Diagnosis not present

## 2015-04-14 MED ORDER — HYDROCODONE-ACETAMINOPHEN 10-325 MG PO TABS: 1.0000 | ORAL_TABLET | Freq: Four times a day (QID) | ORAL | Status: DC | PRN

## 2015-04-14 NOTE — Progress Notes (Signed)
   Subjective:    Patient ID: Linda Kent, female    DOB: 15-Feb-1945, 70 y.o.   MRN: [id number]  HPI Here to discuss her pain control. She has been taking 4 Oxycodone tablets a day and this had worked well until a few months ago. Lately she has been having more breakthrough pain and she has tried Hydrocodone in between. This helps but she has run out.   Review of Systems  Constitutional: Negative.   Musculoskeletal: Positive for myalgias.       Objective:   Physical Exam  Constitutional: She appears well-developed and well-nourished.  Cardiovascular: Normal rate, regular rhythm, normal heart sounds and intact distal pulses.   Pulmonary/Chest: Effort normal and breath sounds normal.  Psychiatric:  Tearful           Assessment & Plan:  Her myofascial pain has worsened a bit. We will plan on using Hydrocodone for breakthrough pain and keep her current Oxycodone schedule. Recheck at her cpx next month.

## 2015-04-22 DIAGNOSIS — M810 Age-related osteoporosis without current pathological fracture: Secondary | ICD-10-CM | POA: Diagnosis not present

## 2015-05-14 DIAGNOSIS — R51 Headache: Secondary | ICD-10-CM | POA: Diagnosis not present

## 2015-05-14 DIAGNOSIS — G43719 Chronic migraine without aura, intractable, without status migrainosus: Secondary | ICD-10-CM | POA: Diagnosis not present

## 2015-05-14 DIAGNOSIS — G43019 Migraine without aura, intractable, without status migrainosus: Secondary | ICD-10-CM | POA: Diagnosis not present

## 2015-05-14 DIAGNOSIS — Z79899 Other long term (current) drug therapy: Secondary | ICD-10-CM | POA: Diagnosis not present

## 2015-06-11 ENCOUNTER — Ambulatory Visit (INDEPENDENT_AMBULATORY_CARE_PROVIDER_SITE_OTHER): Payer: Medicare Other | Admitting: Family Medicine

## 2015-06-11 ENCOUNTER — Encounter: Payer: Self-pay | Admitting: Family Medicine

## 2015-06-11 VITALS — BP 98/68 | HR 74 | Temp 98.0°F | Ht 64.25 in | Wt 125.0 lb

## 2015-06-11 DIAGNOSIS — F329 Major depressive disorder, single episode, unspecified: Secondary | ICD-10-CM

## 2015-06-11 DIAGNOSIS — F411 Generalized anxiety disorder: Secondary | ICD-10-CM

## 2015-06-11 DIAGNOSIS — L719 Rosacea, unspecified: Secondary | ICD-10-CM

## 2015-06-11 DIAGNOSIS — E782 Mixed hyperlipidemia: Secondary | ICD-10-CM

## 2015-06-11 DIAGNOSIS — F32A Depression, unspecified: Secondary | ICD-10-CM

## 2015-06-11 DIAGNOSIS — E039 Hypothyroidism, unspecified: Secondary | ICD-10-CM

## 2015-06-11 DIAGNOSIS — E785 Hyperlipidemia, unspecified: Secondary | ICD-10-CM | POA: Diagnosis not present

## 2015-06-11 DIAGNOSIS — M7918 Myalgia, other site: Secondary | ICD-10-CM

## 2015-06-11 DIAGNOSIS — G4700 Insomnia, unspecified: Secondary | ICD-10-CM

## 2015-06-11 DIAGNOSIS — M791 Myalgia: Secondary | ICD-10-CM

## 2015-06-11 DIAGNOSIS — Z23 Encounter for immunization: Secondary | ICD-10-CM

## 2015-06-11 LAB — POC URINALSYSI DIPSTICK (AUTOMATED)
Bilirubin, UA: NEGATIVE
Blood, UA: NEGATIVE
GLUCOSE UA: NEGATIVE
Ketones, UA: NEGATIVE
LEUKOCYTES UA: NEGATIVE
NITRITE UA: NEGATIVE
PROTEIN UA: NEGATIVE
Spec Grav, UA: 1.01
UROBILINOGEN UA: 0.2
pH, UA: 7.5

## 2015-06-11 LAB — LIPID PANEL
CHOL/HDL RATIO: 5
Cholesterol: 279 mg/dL — ABNORMAL HIGH (ref 0–200)
HDL: 55.7 mg/dL (ref >39.00)
LDL CALC: 188 mg/dL — AB (ref 0–99)
NONHDL: 223.36
Triglycerides: 178 mg/dL — ABNORMAL HIGH (ref 0.0–149.0)
VLDL: 35.6 mg/dL (ref 0.0–40.0)

## 2015-06-11 LAB — BASIC METABOLIC PANEL
BUN: 13 mg/dL (ref 6–23)
CO2: 28 mEq/L (ref 19–32)
Calcium: 9.5 mg/dL (ref 8.4–10.5)
Chloride: 104 mEq/L (ref 96–112)
Creatinine, Ser: 0.87 mg/dL (ref 0.40–1.20)
GFR: 68.07 mL/min (ref >60.00)
Glucose, Bld: 88 mg/dL (ref 70–99)
POTASSIUM: 4 meq/L (ref 3.5–5.1)
SODIUM: 139 meq/L (ref 135–145)

## 2015-06-11 LAB — HEPATIC FUNCTION PANEL
ALK PHOS: 68 U/L (ref 39–117)
ALT: 18 U/L (ref 0–35)
AST: 22 U/L (ref 0–37)
Albumin: 4.2 g/dL (ref 3.5–5.2)
BILIRUBIN TOTAL: 0.4 mg/dL (ref 0.2–1.2)
Bilirubin, Direct: 0.1 mg/dL (ref 0.0–0.3)
Total Protein: 6.5 g/dL (ref 6.0–8.3)

## 2015-06-11 LAB — CBC WITH DIFFERENTIAL/PLATELET
BASOS PCT: 0.6 % (ref 0.0–3.0)
Basophils Absolute: 0 10*3/uL (ref 0.0–0.1)
EOS PCT: 2.4 % (ref 0.0–5.0)
Eosinophils Absolute: 0.2 10*3/uL (ref 0.0–0.7)
HEMATOCRIT: 41.1 % (ref 36.0–46.0)
HEMOGLOBIN: 13.8 g/dL (ref 12.0–15.0)
LYMPHS PCT: 47.6 % — AB (ref 12.0–46.0)
Lymphs Abs: 3.4 10*3/uL (ref 0.7–4.0)
MCHC: 33.6 g/dL (ref 30.0–36.0)
MCV: 95.1 fl (ref 78.0–100.0)
MONO ABS: 0.4 10*3/uL (ref 0.1–1.0)
MONOS PCT: 6 % (ref 3.0–12.0)
Neutro Abs: 3.1 10*3/uL (ref 1.4–7.7)
Neutrophils Relative %: 43.4 % (ref 43.0–77.0)
Platelets: 359 10*3/uL (ref 150.0–400.0)
RBC: 4.33 Mil/uL (ref 3.87–5.11)
RDW: 14.1 % (ref 11.5–15.5)
WBC: 7 10*3/uL (ref 4.0–10.5)

## 2015-06-11 LAB — TSH: TSH: 0.45 u[IU]/mL (ref 0.35–4.50)

## 2015-06-11 MED ORDER — HYDROCODONE-ACETAMINOPHEN 10-325 MG PO TABS: 1.0000 | ORAL_TABLET | Freq: Four times a day (QID) | ORAL | Status: DC | PRN

## 2015-06-11 MED ORDER — OXYCODONE HCL 20 MG PO TABS: 20.0000 mg | ORAL_TABLET | Freq: Four times a day (QID) | ORAL | Status: DC | PRN

## 2015-06-11 MED ORDER — LORAZEPAM 2 MG PO TABS: 2.0000 mg | ORAL_TABLET | Freq: Three times a day (TID) | ORAL | Status: DC | PRN

## 2015-06-11 MED ORDER — METHYLPHENIDATE HCL 20 MG PO TABS: 20.0000 mg | ORAL_TABLET | Freq: Three times a day (TID) | ORAL | Status: DC

## 2015-06-11 MED ORDER — METRONIDAZOLE 0.75 % EX CREA: 1.0000 "application " | TOPICAL_CREAM | Freq: Every day | CUTANEOUS | Status: DC

## 2015-06-11 NOTE — Progress Notes (Signed)
   Subjective:    Patient ID: Linda Kent, female    DOB: 08-05-1945, 70 y.o.   MRN: [id number]  HPI 70 yr old female to follow up on issues. Her fibromyalgia is doing very well right now. Since she started supplementing hydrocodone in between the doses of oxycodone, her pain levels have decreased dramatically. She is able to be more active, doing housework, going shopping, etc. Otherwise no complaints. She sleeps well. Appetite is good.    Review of Systems  Constitutional: Negative.   HENT: Negative.   Eyes: Negative.   Respiratory: Negative.   Cardiovascular: Negative.   Gastrointestinal: Negative.   Genitourinary: Negative for dysuria, urgency, frequency, hematuria, flank pain, decreased urine volume, enuresis, difficulty urinating, pelvic pain and dyspareunia.  Musculoskeletal: Positive for myalgias. Negative for back pain, joint swelling, arthralgias, gait problem, neck pain and neck stiffness.  Skin: Negative.   Neurological: Negative.   Psychiatric/Behavioral: Negative.        Objective:   Physical Exam  Constitutional: She is oriented to person, place, and time. She appears well-developed and well-nourished. No distress.  HENT:  Head: Normocephalic and atraumatic.  Right Ear: External ear normal.  Left Ear: External ear normal.  Nose: Nose normal.  Mouth/Throat: Oropharynx is clear and moist. No oropharyngeal exudate.  Eyes: Conjunctivae and EOM are normal. Pupils are equal, round, and reactive to light. No scleral icterus.  Neck: Normal range of motion. Neck supple. No JVD present. No thyromegaly present.  Cardiovascular: Normal rate, regular rhythm, normal heart sounds and intact distal pulses.  Exam reveals no gallop and no friction rub.   No murmur heard. EKG normal   Pulmonary/Chest: Effort normal and breath sounds normal. No respiratory distress. She has no wheezes. She has no rales. She exhibits no tenderness.  Abdominal: Soft. Bowel sounds are normal. She  exhibits no distension and no mass. There is no tenderness. There is no rebound and no guarding.  Musculoskeletal: Normal range of motion. She exhibits no edema or tenderness.  Lymphadenopathy:    She has no cervical adenopathy.  Neurological: She is alert and oriented to person, place, and time. She has normal reflexes. No cranial nerve deficit. She exhibits normal muscle tone. Coordination normal.  Skin: Skin is warm and dry. No rash noted. No erythema.  Psychiatric: She has a normal mood and affect. Her behavior is normal. Judgment and thought content normal.          Assessment & Plan:  Her depression and anxiety are stable. Her insomnia is stable. Her myofascial pain is under much better control. She will get fasting labs today to check her thyroid level among other things. She is due for another colonoscopy this October. She sees Dr. Domingo Cocking for her migraines.  Linda Morale, MD

## 2015-06-11 NOTE — Progress Notes (Signed)
Pre visit review using our clinic review tool, if applicable. No additional management support is needed unless otherwise documented below in the visit note. 

## 2015-09-10 DIAGNOSIS — M810 Age-related osteoporosis without current pathological fracture: Secondary | ICD-10-CM | POA: Diagnosis not present

## 2015-09-10 DIAGNOSIS — E039 Hypothyroidism, unspecified: Secondary | ICD-10-CM | POA: Diagnosis not present

## 2015-09-10 DIAGNOSIS — E559 Vitamin D deficiency, unspecified: Secondary | ICD-10-CM | POA: Diagnosis not present

## 2015-09-11 ENCOUNTER — Telehealth: Payer: Self-pay | Admitting: Family Medicine

## 2015-09-11 NOTE — Telephone Encounter (Signed)
Pt need new Rx for Oxycodone, Hydrocodone and methylphenidate

## 2015-09-12 NOTE — Telephone Encounter (Signed)
Pt calling to check the status and I made the pt aware that it can take up to 3 business to refill.  Pt verbalize that she understands and she will call in sooner the next time.

## 2015-09-15 NOTE — Telephone Encounter (Signed)
Pt has only onem day of med left

## 2015-09-16 ENCOUNTER — Telehealth: Payer: Self-pay | Admitting: Family Medicine

## 2015-09-16 DIAGNOSIS — G43019 Migraine without aura, intractable, without status migrainosus: Secondary | ICD-10-CM | POA: Diagnosis not present

## 2015-09-16 DIAGNOSIS — G43719 Chronic migraine without aura, intractable, without status migrainosus: Secondary | ICD-10-CM | POA: Diagnosis not present

## 2015-09-16 MED ORDER — METHYLPHENIDATE HCL 20 MG PO TABS: 20.0000 mg | ORAL_TABLET | Freq: Three times a day (TID) | ORAL | 0 refills | Status: DC

## 2015-09-16 MED ORDER — OXYCODONE HCL 20 MG PO TABS: 20.0000 mg | ORAL_TABLET | Freq: Four times a day (QID) | ORAL | 0 refills | Status: DC | PRN

## 2015-09-16 MED ORDER — HYDROCODONE-ACETAMINOPHEN 10-325 MG PO TABS: 1.0000 | ORAL_TABLET | Freq: Four times a day (QID) | ORAL | 0 refills | Status: DC | PRN

## 2015-09-16 NOTE — Telephone Encounter (Signed)
Scripts are ready for pick up and pt is here to pick up now.

## 2015-09-16 NOTE — Telephone Encounter (Signed)
done

## 2015-09-16 NOTE — Telephone Encounter (Signed)
Refill request for Ritalin & Oxycodone.

## 2015-09-16 NOTE — Telephone Encounter (Signed)
° °  Pt req refill    Oxycodone HCl 20 MG TABS   methylphenidate (RITALIN) 20 MG tablet

## 2015-09-16 NOTE — Telephone Encounter (Signed)
Script is ready for pick up at front office and I spoke with pt.

## 2015-10-07 DIAGNOSIS — D126 Benign neoplasm of colon, unspecified: Secondary | ICD-10-CM | POA: Diagnosis not present

## 2015-10-07 DIAGNOSIS — Z86010 Personal history of colonic polyps: Secondary | ICD-10-CM | POA: Diagnosis not present

## 2015-10-07 DIAGNOSIS — D120 Benign neoplasm of cecum: Secondary | ICD-10-CM | POA: Diagnosis not present

## 2015-10-14 DIAGNOSIS — D126 Benign neoplasm of colon, unspecified: Secondary | ICD-10-CM | POA: Diagnosis not present

## 2015-10-15 DIAGNOSIS — Z23 Encounter for immunization: Secondary | ICD-10-CM | POA: Diagnosis not present

## 2015-10-22 DIAGNOSIS — M810 Age-related osteoporosis without current pathological fracture: Secondary | ICD-10-CM | POA: Diagnosis not present

## 2015-11-21 ENCOUNTER — Other Ambulatory Visit: Payer: Self-pay | Admitting: Family Medicine

## 2015-11-24 NOTE — Telephone Encounter (Signed)
Can we refill this? 

## 2015-12-09 ENCOUNTER — Telehealth: Payer: Self-pay | Admitting: Family Medicine

## 2015-12-09 NOTE — Telephone Encounter (Signed)
Pt need new Rx for Hydrocodone, methylphenidate and Oxycodone   Pt is aware of the 3 business day refill.

## 2015-12-15 NOTE — Telephone Encounter (Signed)
Pt would like the rxs tomorrow

## 2015-12-16 MED ORDER — METHYLPHENIDATE HCL 20 MG PO TABS: 20.0000 mg | ORAL_TABLET | Freq: Three times a day (TID) | ORAL | 0 refills | Status: DC

## 2015-12-16 MED ORDER — HYDROCODONE-ACETAMINOPHEN 10-325 MG PO TABS: 1.0000 | ORAL_TABLET | Freq: Four times a day (QID) | ORAL | 0 refills | Status: DC | PRN

## 2015-12-16 MED ORDER — OXYCODONE HCL 20 MG PO TABS: 20.0000 mg | ORAL_TABLET | Freq: Four times a day (QID) | ORAL | 0 refills | Status: DC | PRN

## 2015-12-16 NOTE — Telephone Encounter (Signed)
Pt called to check the status of the Rx's and per Izora Gala the Rx's are ready for pick-up they are up front.

## 2015-12-16 NOTE — Telephone Encounter (Signed)
done

## 2015-12-23 ENCOUNTER — Telehealth: Payer: Self-pay | Admitting: Family Medicine

## 2015-12-23 NOTE — Telephone Encounter (Signed)
I don't see why we need to send them an order. She can set this up herself

## 2015-12-23 NOTE — Telephone Encounter (Signed)
Pt state that her endocrinologist is not willing to order her mammogram and told her that she need to see if her PCP could do it. Pt need for the order to be faxed over to [facility] [phone].

## 2015-12-24 NOTE — Telephone Encounter (Signed)
I spoke with pt and she has tried to set the appointment up for test, they told pt she needed to have a order faxed over.

## 2015-12-26 ENCOUNTER — Other Ambulatory Visit: Payer: Self-pay

## 2015-12-26 NOTE — Telephone Encounter (Signed)
Order written

## 2015-12-26 NOTE — Telephone Encounter (Signed)
I faxed order to below number and spoke with pt.

## 2016-01-21 ENCOUNTER — Telehealth: Payer: Self-pay | Admitting: Family Medicine

## 2016-01-21 MED ORDER — ZALEPLON 10 MG PO CAPS: 10.0000 mg | ORAL_CAPSULE | Freq: Every day | ORAL | 1 refills | Status: DC

## 2016-01-21 NOTE — Telephone Encounter (Signed)
Ready to fax  

## 2016-01-21 NOTE — Telephone Encounter (Signed)
Pt needs refill on zaleplon 10 mg#90 w/refills [facility]

## 2016-01-23 NOTE — Telephone Encounter (Signed)
done

## 2016-01-28 ENCOUNTER — Encounter: Payer: Self-pay | Admitting: Family Medicine

## 2016-01-28 DIAGNOSIS — Z1231 Encounter for screening mammogram for malignant neoplasm of breast: Secondary | ICD-10-CM | POA: Diagnosis not present

## 2016-03-10 ENCOUNTER — Telehealth: Payer: Self-pay | Admitting: Family Medicine

## 2016-03-10 MED ORDER — HYDROCODONE-ACETAMINOPHEN 10-325 MG PO TABS: 1.0000 | ORAL_TABLET | Freq: Four times a day (QID) | ORAL | 0 refills | Status: DC | PRN

## 2016-03-10 MED ORDER — METHYLPHENIDATE HCL 20 MG PO TABS: 20.0000 mg | ORAL_TABLET | Freq: Three times a day (TID) | ORAL | 0 refills | Status: DC

## 2016-03-10 MED ORDER — OXYCODONE HCL 20 MG PO TABS: 20.0000 mg | ORAL_TABLET | Freq: Four times a day (QID) | ORAL | 0 refills | Status: DC | PRN

## 2016-03-10 MED ORDER — OXYCODONE HCL 20 MG PO TABS
20.0000 mg | ORAL_TABLET | Freq: Four times a day (QID) | ORAL | 0 refills | Status: DC | PRN
Start: 2016-03-10 — End: 2016-06-11

## 2016-03-10 NOTE — Telephone Encounter (Signed)
done

## 2016-03-10 NOTE — Telephone Encounter (Signed)
Pt request refill   HYDROcodone-acetaminophen (NORCO) 10-325 MG tablet Oxycodone HCl 20 MG TABS  methylphenidate (RITALIN) 20 MG tablet  Pt calling early because she states she was told to.

## 2016-03-11 DIAGNOSIS — G43019 Migraine without aura, intractable, without status migrainosus: Secondary | ICD-10-CM | POA: Diagnosis not present

## 2016-03-11 DIAGNOSIS — G43719 Chronic migraine without aura, intractable, without status migrainosus: Secondary | ICD-10-CM | POA: Diagnosis not present

## 2016-04-21 DIAGNOSIS — M810 Age-related osteoporosis without current pathological fracture: Secondary | ICD-10-CM | POA: Diagnosis not present

## 2016-04-27 ENCOUNTER — Ambulatory Visit: Payer: Medicare Other

## 2016-06-11 ENCOUNTER — Encounter: Payer: Self-pay | Admitting: Family Medicine

## 2016-06-11 ENCOUNTER — Ambulatory Visit (INDEPENDENT_AMBULATORY_CARE_PROVIDER_SITE_OTHER): Payer: Medicare Other | Admitting: Family Medicine

## 2016-06-11 VITALS — Temp 98.3°F | Ht 64.25 in | Wt 132.0 lb

## 2016-06-11 DIAGNOSIS — F411 Generalized anxiety disorder: Secondary | ICD-10-CM | POA: Diagnosis not present

## 2016-06-11 DIAGNOSIS — R079 Chest pain, unspecified: Secondary | ICD-10-CM

## 2016-06-11 DIAGNOSIS — M7918 Myalgia, other site: Secondary | ICD-10-CM

## 2016-06-11 DIAGNOSIS — E782 Mixed hyperlipidemia: Secondary | ICD-10-CM

## 2016-06-11 DIAGNOSIS — M791 Myalgia: Secondary | ICD-10-CM

## 2016-06-11 DIAGNOSIS — R42 Dizziness and giddiness: Secondary | ICD-10-CM | POA: Diagnosis not present

## 2016-06-11 DIAGNOSIS — G441 Vascular headache, not elsewhere classified: Secondary | ICD-10-CM | POA: Diagnosis not present

## 2016-06-11 DIAGNOSIS — E039 Hypothyroidism, unspecified: Secondary | ICD-10-CM | POA: Diagnosis not present

## 2016-06-11 LAB — POC URINALSYSI DIPSTICK (AUTOMATED)
BILIRUBIN UA: NEGATIVE
Blood, UA: NEGATIVE
GLUCOSE UA: NEGATIVE
KETONES UA: NEGATIVE
LEUKOCYTES UA: NEGATIVE
NITRITE UA: NEGATIVE
Protein, UA: NEGATIVE
Spec Grav, UA: 1.015 (ref 1.010–1.025)
Urobilinogen, UA: 0.2 E.U./dL
pH, UA: 6 (ref 5.0–8.0)

## 2016-06-11 LAB — HEPATIC FUNCTION PANEL
ALK PHOS: 68 U/L (ref 39–117)
ALT: 13 U/L (ref 0–35)
AST: 20 U/L (ref 0–37)
Albumin: 4.3 g/dL (ref 3.5–5.2)
BILIRUBIN TOTAL: 0.5 mg/dL (ref 0.2–1.2)
Bilirubin, Direct: 0.1 mg/dL (ref 0.0–0.3)
Total Protein: 6.6 g/dL (ref 6.0–8.3)

## 2016-06-11 LAB — CBC WITH DIFFERENTIAL/PLATELET
BASOS PCT: 0.6 % (ref 0.0–3.0)
Basophils Absolute: 0 10*3/uL (ref 0.0–0.1)
EOS PCT: 3.2 % (ref 0.0–5.0)
Eosinophils Absolute: 0.2 10*3/uL (ref 0.0–0.7)
HEMATOCRIT: 41.5 % (ref 36.0–46.0)
Hemoglobin: 13.8 g/dL (ref 12.0–15.0)
Lymphocytes Relative: 47.9 % — ABNORMAL HIGH (ref 12.0–46.0)
Lymphs Abs: 3.1 10*3/uL (ref 0.7–4.0)
MCHC: 33.2 g/dL (ref 30.0–36.0)
MCV: 96.4 fl (ref 78.0–100.0)
MONOS PCT: 7.3 % (ref 3.0–12.0)
Monocytes Absolute: 0.5 10*3/uL (ref 0.1–1.0)
NEUTROS ABS: 2.6 10*3/uL (ref 1.4–7.7)
NEUTROS PCT: 41 % — AB (ref 43.0–77.0)
PLATELETS: 294 10*3/uL (ref 150.0–400.0)
RBC: 4.31 Mil/uL (ref 3.87–5.11)
RDW: 12.8 % (ref 11.5–15.5)
WBC: 6.5 10*3/uL (ref 4.0–10.5)

## 2016-06-11 LAB — BASIC METABOLIC PANEL
BUN: 15 mg/dL (ref 6–23)
CHLORIDE: 104 meq/L (ref 96–112)
CO2: 27 mEq/L (ref 19–32)
Calcium: 9.8 mg/dL (ref 8.4–10.5)
Creatinine, Ser: 0.9 mg/dL (ref 0.40–1.20)
GFR: 65.27 mL/min (ref >60.00)
Glucose, Bld: 85 mg/dL (ref 70–99)
POTASSIUM: 3.9 meq/L (ref 3.5–5.1)
SODIUM: 139 meq/L (ref 135–145)

## 2016-06-11 LAB — LIPID PANEL
CHOL/HDL RATIO: 4
CHOLESTEROL: 237 mg/dL — AB (ref 0–200)
HDL: 55.6 mg/dL (ref >39.00)
LDL Cholesterol: 143 mg/dL — ABNORMAL HIGH (ref 0–99)
NonHDL: 181.26
Triglycerides: 189 mg/dL — ABNORMAL HIGH (ref 0.0–149.0)
VLDL: 37.8 mg/dL (ref 0.0–40.0)

## 2016-06-11 LAB — TSH: TSH: 0.36 u[IU]/mL (ref 0.35–4.50)

## 2016-06-11 MED ORDER — OXYCODONE HCL 20 MG PO TABS: 20.0000 mg | ORAL_TABLET | Freq: Four times a day (QID) | ORAL | 0 refills | Status: DC | PRN

## 2016-06-11 MED ORDER — HYDROCODONE-ACETAMINOPHEN 10-325 MG PO TABS: 1.0000 | ORAL_TABLET | Freq: Four times a day (QID) | ORAL | 0 refills | Status: DC | PRN

## 2016-06-11 MED ORDER — METRONIDAZOLE 0.75 % EX CREA: 1.0000 "application " | TOPICAL_CREAM | Freq: Every day | CUTANEOUS | 5 refills | Status: DC

## 2016-06-11 MED ORDER — METHYLPHENIDATE HCL 20 MG PO TABS: 20.0000 mg | ORAL_TABLET | Freq: Three times a day (TID) | ORAL | 0 refills | Status: DC

## 2016-06-11 MED ORDER — METHYLPHENIDATE HCL 20 MG PO TABS
20.0000 mg | ORAL_TABLET | Freq: Three times a day (TID) | ORAL | 0 refills | Status: DC
Start: 2016-06-11 — End: 2016-06-11

## 2016-06-11 MED ORDER — CLOBETASOL PROPIONATE 0.05 % EX CREA: TOPICAL_CREAM | CUTANEOUS | 5 refills | Status: DC

## 2016-06-11 MED ORDER — LORAZEPAM 2 MG PO TABS
2.0000 mg | ORAL_TABLET | Freq: Three times a day (TID) | ORAL | 5 refills | Status: DC | PRN
Start: 2016-06-11 — End: 2017-01-12

## 2016-06-11 MED ORDER — MECLIZINE HCL 25 MG PO TABS: 25.0000 mg | ORAL_TABLET | ORAL | 5 refills | Status: DC | PRN

## 2016-06-11 NOTE — Progress Notes (Signed)
Subjective:    Patient ID: Linda Kent, female    DOB: May 27, 1945, 71 y.o.   MRN: [id number]  HPI Here for a number of issues. Her myofascial pain syndrome has been stable and she manges fairly well with daily pain medications. She does mention some episodes of chest pain in the past 2 months. She describes a sharp or squeezing pain in the center of the chest along with SOB that is often associated with exertion. This lasts form 30 to 60 minutes and is often relieved by taking a Lorazepam. Also she describes 3 episodes of dizziness which she had never experienced before. These have occurred over the past 2 months. She will suddenly feel like the room is spinning around her and she gest very nauseated. This is brought on by moving her head quickly or by rolling over in bed. They last from 3 to 5 days at a time. OTC Dramamine helps a little. No headaches or other neurologic deficits.    Review of Systems  Constitutional: Negative.   HENT: Negative.   Eyes: Negative.   Respiratory: Positive for shortness of breath. Negative for apnea, cough, choking, chest tightness, wheezing and stridor.   Cardiovascular: Positive for chest pain. Negative for palpitations and leg swelling.  Gastrointestinal: Negative.   Genitourinary: Negative for decreased urine volume, difficulty urinating, dyspareunia, dysuria, enuresis, flank pain, frequency, hematuria, pelvic pain and urgency.  Musculoskeletal: Positive for myalgias. Negative for arthralgias, back pain, gait problem, joint swelling, neck pain and neck stiffness.  Skin: Negative.   Neurological: Positive for dizziness. Negative for tremors, seizures, syncope, facial asymmetry, speech difficulty, weakness, light-headedness, numbness and headaches.  Psychiatric/Behavioral: Negative.        Objective:   Physical Exam  Constitutional: She is oriented to person, place, and time. She appears well-developed and well-nourished. No distress.  HENT:  Head:  Normocephalic and atraumatic.  Right Ear: External ear normal.  Left Ear: External ear normal.  Nose: Nose normal.  Mouth/Throat: Oropharynx is clear and moist. No oropharyngeal exudate.  Eyes: Conjunctivae and EOM are normal. Pupils are equal, round, and reactive to light. No scleral icterus.  Neck: Normal range of motion. Neck supple. No JVD present. No thyromegaly present.  Cardiovascular: Normal rate, regular rhythm, normal heart sounds and intact distal pulses.  Exam reveals no gallop and no friction rub.   No murmur heard. EKG is within normal limits   Pulmonary/Chest: Effort normal and breath sounds normal. No respiratory distress. She has no wheezes. She has no rales. She exhibits no tenderness.  Abdominal: Soft. Bowel sounds are normal. She exhibits no distension and no mass. There is no tenderness. There is no rebound and no guarding.  Musculoskeletal: Normal range of motion. She exhibits no edema or tenderness.  Lymphadenopathy:    She has no cervical adenopathy.  Neurological: She is alert and oriented to person, place, and time. She has normal reflexes. No cranial nerve deficit. She exhibits normal muscle tone. Coordination normal.  Skin: Skin is warm and dry. No rash noted. No erythema.  Psychiatric: She has a normal mood and affect. Her behavior is normal. Judgment and thought content normal.          Assessment & Plan:  She has had several episodes of chest pain with SOB and we need to rule out cardiac problems. We will set her up for a stress test soon. She has had several episodes of vertigo, likely BPV. Try Meclizine prn. Her myofacial pain is stable. Her  depression and headaches are stable. Get fasting labs today to check lipids, etc. Alysia Penna, MD

## 2016-06-11 NOTE — Patient Instructions (Signed)
WE NOW OFFER   [facility] FAST TRACK!!!  SAME DAY Appointments for ACUTE CARE  Such as: Sprains, Injuries, cuts, abrasions, rashes, muscle pain, joint pain, back pain Colds, flu, sore throats, headache, allergies, cough, fever  Ear pain, sinus and eye infections Abdominal pain, nausea, vomiting, diarrhea, upset stomach Animal/insect bites  3 Easy Ways to Schedule: Walk-In Scheduling Call in scheduling Mychart Sign-up: [email or web address]         

## 2016-06-29 ENCOUNTER — Ambulatory Visit: Payer: Medicare Other

## 2016-07-05 ENCOUNTER — Telehealth: Payer: Self-pay | Admitting: Family Medicine

## 2016-07-05 MED ORDER — ZALEPLON 10 MG PO CAPS: 10.0000 mg | ORAL_CAPSULE | Freq: Every day | ORAL | 1 refills | Status: DC

## 2016-07-05 NOTE — Telephone Encounter (Signed)
I faxed script to [facility] and spoke with pt.

## 2016-07-05 NOTE — Telephone Encounter (Signed)
Please fax the rx to [facility]

## 2016-07-05 NOTE — Telephone Encounter (Signed)
Pt request refill  zaleplon (SONATA) 10 MG capsule    [facility] [place], [place]  Pt would like a call when this is done.  Ok to leave on voice mail

## 2016-09-08 ENCOUNTER — Telehealth: Payer: Self-pay | Admitting: Family Medicine

## 2016-09-08 NOTE — Telephone Encounter (Signed)
Pt need new Rx for Hydrocodone, Oxycodone and methylphenidate.   Pt is aware of 3 business days for refills and someone will call when ready for pick up.

## 2016-09-09 MED ORDER — HYDROCODONE-ACETAMINOPHEN 10-325 MG PO TABS: 1.0000 | ORAL_TABLET | Freq: Four times a day (QID) | ORAL | 0 refills | Status: DC | PRN

## 2016-09-09 MED ORDER — METHYLPHENIDATE HCL 20 MG PO TABS: 20.0000 mg | ORAL_TABLET | Freq: Three times a day (TID) | ORAL | 0 refills | Status: DC

## 2016-09-09 MED ORDER — OXYCODONE HCL 20 MG PO TABS: 20.0000 mg | ORAL_TABLET | Freq: Four times a day (QID) | ORAL | 0 refills | Status: DC | PRN

## 2016-09-09 NOTE — Telephone Encounter (Signed)
Script is ready for pick up here at front office and I spoke with pt.  

## 2016-09-09 NOTE — Telephone Encounter (Signed)
Done

## 2016-09-14 DIAGNOSIS — G43719 Chronic migraine without aura, intractable, without status migrainosus: Secondary | ICD-10-CM | POA: Diagnosis not present

## 2016-09-14 DIAGNOSIS — G43019 Migraine without aura, intractable, without status migrainosus: Secondary | ICD-10-CM | POA: Diagnosis not present

## 2016-09-21 ENCOUNTER — Encounter: Payer: Self-pay | Admitting: Family Medicine

## 2016-09-21 DIAGNOSIS — M810 Age-related osteoporosis without current pathological fracture: Secondary | ICD-10-CM | POA: Diagnosis not present

## 2016-09-30 ENCOUNTER — Telehealth: Payer: Self-pay | Admitting: Family Medicine

## 2016-09-30 NOTE — Telephone Encounter (Signed)
I left a voice message for pt to return my call. I tried to reach pt to go over bone density scan results-she has osteopenia take calcium 1500 mg every day and vitamin d 2,000 units every day.

## 2016-10-01 NOTE — Telephone Encounter (Signed)
I spoke with pt and went over results. 

## 2016-10-05 DIAGNOSIS — E559 Vitamin D deficiency, unspecified: Secondary | ICD-10-CM | POA: Diagnosis not present

## 2016-10-05 DIAGNOSIS — E039 Hypothyroidism, unspecified: Secondary | ICD-10-CM | POA: Diagnosis not present

## 2016-10-05 DIAGNOSIS — M810 Age-related osteoporosis without current pathological fracture: Secondary | ICD-10-CM | POA: Diagnosis not present

## 2016-10-05 DIAGNOSIS — Z23 Encounter for immunization: Secondary | ICD-10-CM | POA: Diagnosis not present

## 2016-10-20 ENCOUNTER — Telehealth: Payer: Self-pay | Admitting: Family Medicine

## 2016-10-20 NOTE — Telephone Encounter (Signed)
I spoke with pt and went over results. 

## 2016-10-20 NOTE — Telephone Encounter (Signed)
Pt is returning sylvia call

## 2016-10-21 DIAGNOSIS — M810 Age-related osteoporosis without current pathological fracture: Secondary | ICD-10-CM | POA: Diagnosis not present

## 2016-12-13 ENCOUNTER — Telehealth: Payer: Self-pay | Admitting: Family Medicine

## 2016-12-13 MED ORDER — METHYLPHENIDATE HCL 20 MG PO TABS: 20.0000 mg | ORAL_TABLET | Freq: Three times a day (TID) | ORAL | 0 refills | Status: DC

## 2016-12-13 MED ORDER — HYDROCODONE-ACETAMINOPHEN 10-325 MG PO TABS: 1.0000 | ORAL_TABLET | Freq: Four times a day (QID) | ORAL | 0 refills | Status: DC | PRN

## 2016-12-13 MED ORDER — OXYCODONE HCL 20 MG PO TABS: 20.0000 mg | ORAL_TABLET | Freq: Four times a day (QID) | ORAL | 0 refills | Status: DC | PRN

## 2016-12-13 NOTE — Telephone Encounter (Signed)
Copied from [place]. Topic: Quick Communication - See Telephone Encounter >> Dec 13, 2016 10:18 AM Lennox Solders wrote: CRM for notification. See Telephone encounter for: 12/13/16. Pt needs new rxs oxycodone, hydrocodone and generic ritalin

## 2016-12-13 NOTE — Telephone Encounter (Signed)
Sent to PCP for approval. Last OV 06/11/2016. All three Rx's were last refilled on 09/09/2016.

## 2016-12-13 NOTE — Telephone Encounter (Signed)
Done

## 2016-12-13 NOTE — Telephone Encounter (Signed)
Pt advised and Rx's placed up front for pick up.

## 2016-12-13 NOTE — Telephone Encounter (Signed)
Requesting  Refill   Of  Scheduled  meds

## 2017-01-12 ENCOUNTER — Encounter: Payer: Self-pay | Admitting: Family Medicine

## 2017-01-12 ENCOUNTER — Ambulatory Visit (INDEPENDENT_AMBULATORY_CARE_PROVIDER_SITE_OTHER): Payer: Medicare Other | Admitting: Family Medicine

## 2017-01-12 VITALS — BP 110/78 | HR 92 | Temp 98.3°F | Wt 137.0 lb

## 2017-01-12 DIAGNOSIS — M7918 Myalgia, other site: Secondary | ICD-10-CM

## 2017-01-12 DIAGNOSIS — F1190 Opioid use, unspecified, uncomplicated: Secondary | ICD-10-CM | POA: Diagnosis not present

## 2017-01-12 MED ORDER — OXYCODONE HCL 20 MG PO TABS: 20.0000 mg | ORAL_TABLET | Freq: Four times a day (QID) | ORAL | 0 refills | Status: DC | PRN

## 2017-01-12 MED ORDER — ALPRAZOLAM 1 MG PO TABS: 1.0000 mg | ORAL_TABLET | Freq: Three times a day (TID) | ORAL | 2 refills | Status: DC | PRN

## 2017-01-12 MED ORDER — HYDROCODONE-ACETAMINOPHEN 10-325 MG PO TABS: 1.0000 | ORAL_TABLET | Freq: Four times a day (QID) | ORAL | 0 refills | Status: DC | PRN

## 2017-01-12 NOTE — Progress Notes (Signed)
   Subjective:    Patient ID: Linda Kent, female    DOB: 1945/06/14, 71 y.o.   MRN: [id number]  HPI Here for a pain management visit. Her myofascial pain syndrome is stable and the medications help a lot. However her anxiety has worsened and she would like to try something other than Lorazepam for that.  Indication for chronic opioid: myofascial pain syndrome Medication and dose: Norco 10-325 and Oxycodone 20 mg  # pills per month: 124 and 124  Last UDS date: 01-12-17  Pain contract signed (Y/N): 01-12-17  Date narcotic database last reviewed (include red flags): 01-12-17     Review of Systems  Constitutional: Negative.   Respiratory: Negative.   Cardiovascular: Negative.   Musculoskeletal: Positive for myalgias.  Neurological: Negative.   Psychiatric/Behavioral: Negative for dysphoric mood. The patient is nervous/anxious.        Objective:   Physical Exam  Constitutional: She is oriented to person, place, and time. She appears well-developed and well-nourished.  Cardiovascular: Normal rate, regular rhythm, normal heart sounds and intact distal pulses.  Pulmonary/Chest: Effort normal and breath sounds normal. No respiratory distress. She has no wheezes. She has no rales.  Neurological: She is alert and oriented to person, place, and time.  Psychiatric: She has a normal mood and affect. Her behavior is normal. Thought content normal.          Assessment & Plan:  Pain management. She is doing well. Pain meds were refilled. Try Xanax for anxiety.  Alysia Penna, MD

## 2017-01-14 ENCOUNTER — Ambulatory Visit: Payer: Medicare Other | Admitting: Family Medicine

## 2017-01-16 LAB — PAIN MGMT, PROFILE 8 W/CONF, U
6 Acetylmorphine: NEGATIVE ng/mL (ref <10)
ALCOHOL METABOLITES: NEGATIVE ng/mL (ref <500)
ALPHAHYDROXYMIDAZOLAM: NEGATIVE ng/mL (ref <50)
ALPHAHYDROXYTRIAZOLAM: NEGATIVE ng/mL (ref <50)
Alphahydroxyalprazolam: NEGATIVE ng/mL (ref <25)
Aminoclonazepam: NEGATIVE ng/mL (ref <25)
Amphetamines: NEGATIVE ng/mL (ref <500)
BENZODIAZEPINES: POSITIVE ng/mL — AB (ref <100)
BUPRENORPHINE, URINE: NEGATIVE ng/mL (ref <5)
COCAINE METABOLITE: NEGATIVE ng/mL (ref <150)
CODEINE: NEGATIVE ng/mL (ref <50)
CREATININE: 32.3 mg/dL
Hydrocodone: 399 ng/mL — ABNORMAL HIGH (ref <50)
Hydromorphone: 108 ng/mL — ABNORMAL HIGH (ref <50)
Hydroxyethylflurazepam: NEGATIVE ng/mL (ref <50)
Lorazepam: 1505 ng/mL — ABNORMAL HIGH (ref <50)
MDMA: NEGATIVE ng/mL (ref <500)
Marijuana Metabolite: NEGATIVE ng/mL (ref <20)
Morphine: NEGATIVE ng/mL (ref <50)
NORDIAZEPAM: NEGATIVE ng/mL (ref <50)
NORHYDROCODONE: 1156 ng/mL — AB (ref <50)
NOROXYCODONE: 8328 ng/mL — AB (ref <50)
OPIATES: POSITIVE ng/mL — AB (ref <100)
OXAZEPAM: NEGATIVE ng/mL (ref <50)
OXYCODONE: POSITIVE ng/mL — AB (ref <100)
OXYMORPHONE: 2513 ng/mL — AB (ref <50)
Oxidant: NEGATIVE ug/mL (ref <200)
Oxycodone: 1245 ng/mL — ABNORMAL HIGH (ref <50)
PH: 7.65 (ref 4.5–9.0)
Temazepam: NEGATIVE ng/mL (ref <50)

## 2017-02-14 ENCOUNTER — Other Ambulatory Visit: Payer: Self-pay | Admitting: Family Medicine

## 2017-02-14 NOTE — Telephone Encounter (Signed)
Last OV 01/12/2017  Last refilled 01/12/2017 pt should have refills call pt's pharmacy

## 2017-02-15 NOTE — Telephone Encounter (Signed)
Pt picked up the Rx yesterday

## 2017-02-21 ENCOUNTER — Other Ambulatory Visit: Payer: Self-pay | Admitting: Family Medicine

## 2017-02-21 NOTE — Telephone Encounter (Signed)
Last OV 01/12/2017  Last refilled 07/05/2016 disp 90 with 1 refill   Sent to PCP for approval

## 2017-02-21 NOTE — Telephone Encounter (Signed)
Copied from [place] [id number]. Topic: Quick Communication - Rx Refill/Question >> Feb 21, 2017 10:13 AM Cecelia Byars, NT wrote: Medication: zaleplon 10 mg Has the patient contacted their pharmacy?  no (Agent: If no, request that the patient contact the pharmacy for the refill.) Preferred Pharmacy (with phone number or street name): [place] Agent: Please be advised that RX refills may take up to 3 business days. We ask that you follow-up with your pharmacy. Please request a 90 day supply  from [facility] , she has a 1 week supply

## 2017-02-21 NOTE — Addendum Note (Signed)
Addended by: Myriam Forehand on: 02/21/2017 01:52 PM   Modules accepted: Orders

## 2017-02-21 NOTE — Telephone Encounter (Signed)
Zaleplon ([place]) 10mg  refill request  Controlled substance Last OV 01/14/17.   Requesting a 90 day supply [facility] [phone]

## 2017-02-22 MED ORDER — ZALEPLON 10 MG PO CAPS: 10.0000 mg | ORAL_CAPSULE | Freq: Every day | ORAL | 1 refills | Status: DC

## 2017-02-22 NOTE — Telephone Encounter (Signed)
I emailed these to [facility]

## 2017-02-22 NOTE — Addendum Note (Signed)
Addended by: Alysia Penna A on: 02/22/2017 07:57 AM   Modules accepted: Orders

## 2017-02-23 ENCOUNTER — Other Ambulatory Visit: Payer: Self-pay | Admitting: Family Medicine

## 2017-02-23 NOTE — Telephone Encounter (Signed)
Was sent to [facility], needs a week worth sent to [facility].

## 2017-02-23 NOTE — Telephone Encounter (Signed)
Copied from [place] [phone]. Topic: Quick Communication - Rx Refill/Question >> Feb 21, 2017 10:13 AM Cecelia Byars, NT wrote: Medication: zaleplon 10 mg Has the patient contacted their pharmacy?  no (Agent: If no, request that the patient contact the pharmacy for the refill.) Preferred Pharmacy (with phone number or street name): [place] Agent: Please be advised that RX refills may take up to 3 business days. We ask that you follow-up with your pharmacy. Please request a 90 day supply  from [facility] , she has a 1 week supply

## 2017-02-23 NOTE — Telephone Encounter (Signed)
I called in a 1 week supply to below number.

## 2017-03-14 DIAGNOSIS — G43019 Migraine without aura, intractable, without status migrainosus: Secondary | ICD-10-CM | POA: Diagnosis not present

## 2017-03-14 DIAGNOSIS — G43719 Chronic migraine without aura, intractable, without status migrainosus: Secondary | ICD-10-CM | POA: Diagnosis not present

## 2017-03-17 ENCOUNTER — Other Ambulatory Visit: Payer: Self-pay | Admitting: Family Medicine

## 2017-03-18 NOTE — Telephone Encounter (Signed)
Last OV: 01/12/2017 Last filled: 01/12/2017, #90, 2 RF Sig: Take 1 tablet (1 mg total) by mouth 3 (three) times daily as needed for anxiety or sleep. UDS: Not on file within the last year

## 2017-03-18 NOTE — Telephone Encounter (Signed)
Call in #90 with 5 rf 

## 2017-03-21 NOTE — Telephone Encounter (Signed)
Rx/forms faxed. Fax confirmation received.  

## 2017-04-12 ENCOUNTER — Ambulatory Visit (INDEPENDENT_AMBULATORY_CARE_PROVIDER_SITE_OTHER): Payer: Medicare Other | Admitting: Family Medicine

## 2017-04-12 ENCOUNTER — Encounter: Payer: Self-pay | Admitting: Family Medicine

## 2017-04-12 VITALS — BP 98/60 | HR 89 | Temp 98.0°F | Ht 64.25 in | Wt 135.2 lb

## 2017-04-12 DIAGNOSIS — F1190 Opioid use, unspecified, uncomplicated: Secondary | ICD-10-CM

## 2017-04-12 DIAGNOSIS — M7918 Myalgia, other site: Secondary | ICD-10-CM | POA: Diagnosis not present

## 2017-04-12 MED ORDER — METHYLPHENIDATE HCL 20 MG PO TABS: 20.0000 mg | ORAL_TABLET | Freq: Two times a day (BID) | ORAL | 0 refills | Status: DC

## 2017-04-12 MED ORDER — OXYCODONE HCL 20 MG PO TABS: 20.0000 mg | ORAL_TABLET | ORAL | 0 refills | Status: DC | PRN

## 2017-04-12 MED ORDER — HYDROCODONE-ACETAMINOPHEN 10-325 MG PO TABS: 1.0000 | ORAL_TABLET | Freq: Four times a day (QID) | ORAL | 0 refills | Status: DC | PRN

## 2017-04-12 MED ORDER — OXYCODONE HCL 20 MG PO TABS: 20.0000 mg | ORAL_TABLET | ORAL | 0 refills | Status: AC | PRN

## 2017-04-12 MED ORDER — CLOBETASOL PROPIONATE 0.05 % EX CREA: 1.0000 "application " | TOPICAL_CREAM | Freq: Two times a day (BID) | CUTANEOUS | 5 refills | Status: DC

## 2017-04-12 MED ORDER — HYDROCODONE-ACETAMINOPHEN 10-325 MG PO TABS: 1.0000 | ORAL_TABLET | Freq: Four times a day (QID) | ORAL | 0 refills | Status: AC | PRN

## 2017-04-12 NOTE — Progress Notes (Signed)
   Subjective:    Patient ID: Linda Kent, female    DOB: 1946/01/01, 72 y.o.   MRN: [id number]  HPI Here for pain management. She has mostly good days but the recent cool damp weather makes her pain worse. She asks if the dosing interval for the Oxycodone can be decreased to help with this.  Indication for chronic opioid: myofascial pain  Medication and dose: Norco 10-325 and Oxycodone 20 mg  # pills per month: 120 and 120  Last UDS date: 01-12-17  Opioid Treatment Agreement signed (Y/N): 04-12-17 Opioid Treatment Agreement last reviewed with patient:  04-12-17  NCCSRS reviewed this encounter (include red flags):  04-12-17     Review of Systems  Constitutional: Negative.   Respiratory: Negative.   Cardiovascular: Negative.   Musculoskeletal: Positive for myalgias.  Neurological: Negative.        Objective:   Physical Exam  Constitutional: She is oriented to person, place, and time. She appears well-developed and well-nourished.  Cardiovascular: Normal rate, regular rhythm, normal heart sounds and intact distal pulses.  Pulmonary/Chest: Effort normal and breath sounds normal. No respiratory distress. She has no wheezes. She has no rales.  Neurological: She is alert and oriented to person, place, and time.          Assessment & Plan:  Pain management. We will change the Oxycodone to dose every 4 hours prn pain.  Alysia Penna, MD

## 2017-04-13 ENCOUNTER — Telehealth: Payer: Self-pay | Admitting: Family Medicine

## 2017-04-13 NOTE — Telephone Encounter (Signed)
Copied from [place]. Topic: General - Other >> Apr 13, 2017  5:10 PM Cecelia Byars, NT wrote: Reason for CRM: The pharmacy called and would like to fill the prescriptions for these medications early  HYDROcodone-acetaminophen ([place]) 10-325 MG tablet , and also Oxycodone HCl 20 MG TABS sent to  [place], [place] [place] [phone] (Phone) [phone] (Fax

## 2017-04-14 NOTE — Telephone Encounter (Signed)
Called the pharmacy to give them the OK to refills Rx early

## 2017-04-14 NOTE — Telephone Encounter (Signed)
Sent to PCP for the OK to fill Rx's today and NOT tomorrow? Pharmacy is able to fill both Rx's tomorrow.

## 2017-04-14 NOTE — Telephone Encounter (Signed)
These are okay to fill early

## 2017-04-15 ENCOUNTER — Telehealth: Payer: Self-pay

## 2017-04-15 NOTE — Telephone Encounter (Signed)
PA request for Clobetasol Prop 0.05% cream 60 GM   Fax from [place] [place]   Sent to [employer] form placed on desk.  Thanks

## 2017-04-18 ENCOUNTER — Telehealth: Payer: Self-pay | Admitting: *Deleted

## 2017-04-18 NOTE — Telephone Encounter (Signed)
See prior note

## 2017-04-18 NOTE — Telephone Encounter (Signed)
Prior auth for Clobetasol prop 0.05% cream sent to [email or web address].

## 2017-04-21 DIAGNOSIS — M810 Age-related osteoporosis without current pathological fracture: Secondary | ICD-10-CM | POA: Diagnosis not present

## 2017-04-22 NOTE — Telephone Encounter (Signed)
Fax received from [place] stating the request was denied and this was given to Dr Barbie Banner asst.

## 2017-04-22 NOTE — Telephone Encounter (Signed)
Placed in Dr. Barbie Banner GREN fold to review.

## 2017-04-25 MED ORDER — HALOBETASOL PROPIONATE 0.05 % EX CREA: TOPICAL_CREAM | Freq: Two times a day (BID) | CUTANEOUS | 5 refills | Status: DC

## 2017-04-25 NOTE — Telephone Encounter (Signed)
Medication has been sent to [facility]

## 2017-04-25 NOTE — Telephone Encounter (Signed)
Please substitute Halobetasol propionate 0.05% cream to apply bid, 45 grams with 5 rf

## 2017-04-29 ENCOUNTER — Ambulatory Visit (INDEPENDENT_AMBULATORY_CARE_PROVIDER_SITE_OTHER): Payer: Medicare Other | Admitting: Family Medicine

## 2017-04-29 ENCOUNTER — Encounter: Payer: Self-pay | Admitting: Family Medicine

## 2017-04-29 VITALS — BP 118/68 | HR 108 | Temp 98.4°F | Ht 64.25 in | Wt 138.8 lb

## 2017-04-29 DIAGNOSIS — R300 Dysuria: Secondary | ICD-10-CM | POA: Diagnosis not present

## 2017-04-29 DIAGNOSIS — N3000 Acute cystitis without hematuria: Secondary | ICD-10-CM | POA: Diagnosis not present

## 2017-04-29 LAB — POCT URINALYSIS DIPSTICK
BILIRUBIN UA: NEGATIVE
Blood, UA: NEGATIVE
GLUCOSE UA: NEGATIVE
KETONES UA: NEGATIVE
Nitrite, UA: NEGATIVE
Protein, UA: NEGATIVE
SPEC GRAV UA: 1.01 (ref 1.010–1.025)
Urobilinogen, UA: 0.2 E.U./dL
pH, UA: 8 (ref 5.0–8.0)

## 2017-04-29 MED ORDER — CIPROFLOXACIN HCL 500 MG PO TABS: 500.0000 mg | ORAL_TABLET | Freq: Two times a day (BID) | ORAL | 0 refills | Status: DC

## 2017-04-29 MED ORDER — FLUCONAZOLE 150 MG PO TABS: 150.0000 mg | ORAL_TABLET | Freq: Every day | ORAL | 2 refills | Status: DC

## 2017-04-29 NOTE — Progress Notes (Signed)
   Subjective:    Patient ID: ARLETHA MARSCHKE, female    DOB: 10-10-1945, 72 y.o.   MRN: [id number]  HPI Here for 4 days of urgency to urinate and burning. No fever. Using Azo.    Review of Systems  Constitutional: Negative.   Respiratory: Negative.   Cardiovascular: Negative.   Genitourinary: Positive for dysuria, frequency and urgency. Negative for flank pain and hematuria.       Objective:   Physical Exam  Constitutional: She appears well-developed and well-nourished.  Cardiovascular: Normal rate, regular rhythm, normal heart sounds and intact distal pulses.  Pulmonary/Chest: Effort normal and breath sounds normal. No respiratory distress. She has no wheezes. She has no rales.  Abdominal: Soft. Bowel sounds are normal. She exhibits no distension and no mass. There is no tenderness. There is no rebound.          Assessment & Plan:  UTI, treat with Cipro. Culture the sample.  Alysia Penna, MD

## 2017-04-30 LAB — URINE CULTURE
MICRO NUMBER:: 90453662
SPECIMEN QUALITY:: ADEQUATE

## 2017-05-09 ENCOUNTER — Telehealth: Payer: Self-pay | Admitting: Family Medicine

## 2017-05-09 MED ORDER — NITROFURANTOIN MONOHYD MACRO 100 MG PO CAPS: 100.0000 mg | ORAL_CAPSULE | Freq: Two times a day (BID) | ORAL | 0 refills | Status: DC

## 2017-05-09 NOTE — Telephone Encounter (Signed)
Call in Macrobid 100 mg bid for 7 days  

## 2017-05-09 NOTE — Telephone Encounter (Signed)
Called and spoke with pt. Pt advised and voiced understanding.  

## 2017-05-09 NOTE — Telephone Encounter (Signed)
Copied from [place]. Topic: Quick Communication - See Telephone Encounter >> May 09, 2017  3:29 PM Boyd Kerbs wrote: CRM for notification.   Pt is asking if can calling in [place] for burning for UTI   [facility] [place], [address] [place] [address] Phone: [phone] Fax: [phone]    See Telephone encounter for: 05/09/17.

## 2017-05-09 NOTE — Telephone Encounter (Signed)
Copied from [place] [id number]. Topic: Quick Communication - See Telephone Encounter >> May 09, 2017 10:22 AM Bea Graff, NT wrote: CRM for notification. See Telephone encounter for: 05/09/17. Pt calling and states that 10 days ago she was prescribed Cipro for a UTI and that the medication did not help at all and she is still as bad as she was before the medication. She wants to see if something else can be called in to her pharmacy. [facility] [place], [place] [place] [phone] (Phone) [phone] (Fax)  Also, she states most antibiotics give her yeast infections.

## 2017-05-09 NOTE — Telephone Encounter (Signed)
1. Pt called back to add to this message.  Pt states she is burning so bad, as if she was on fire down there.  Pt has been using AZO, and wants to know if anything else stronger than AZO to be prescribed to help with this burning.  2. Pt also wants to know should she use the diflucan refill with anything that is called in?

## 2017-05-09 NOTE — Telephone Encounter (Signed)
Sent to PCP to advise 

## 2017-05-10 NOTE — Telephone Encounter (Signed)
Left VM to call back re: request, symptoms.

## 2017-05-10 NOTE — Telephone Encounter (Signed)
Patient called in asking if Pyridium will help with burning for UTI. Please advise.  Pharmacy: [facility] [place], [place] [phone] (Phone) [phone] (Fax)

## 2017-05-11 NOTE — Telephone Encounter (Signed)
I spoke with pt, she is taking Macrobid right now and will see if this helps her UTI, she will call back next week if she needs anything further.

## 2017-05-11 NOTE — Telephone Encounter (Signed)
I left a voice message for pt to schedule a office visit today with another provider, Dr. Sarajane Jews is out of the office until Monday 05/16/2017.

## 2017-05-30 ENCOUNTER — Telehealth: Payer: Self-pay | Admitting: Family Medicine

## 2017-05-30 NOTE — Telephone Encounter (Signed)
Please advise 

## 2017-05-30 NOTE — Telephone Encounter (Signed)
Copied from [place] [id number]. Topic: Quick Communication - See Telephone Encounter >> May 30, 2017 10:55 AM Ahmed Prima L wrote: CRM for notification. See Telephone encounter for: 05/30/17.  Patient states that Dr Sarajane Jews wrote her a prescription for generic xanax and she said that was going to be for sleep and if she needed for her nerves. She has been trying it for sleep, and it is not effected at all. She wants to know if Dr Sarajane Jews will call in LORazepam (ATIVAN) 2 MG tablet [[id number]]. She said this works so much better than the xanax. She said it makes her feel funny and does not like it.   [place] ([place]), [place] [address] [place] ([place]) [place] [address]

## 2017-05-30 NOTE — Telephone Encounter (Signed)
Cancel the Xanax. Call in Ativan 2 mg to take TID #90 with 5 rf

## 2017-05-31 ENCOUNTER — Other Ambulatory Visit: Payer: Self-pay | Admitting: Emergency Medicine

## 2017-05-31 MED ORDER — LORAZEPAM 2 MG PO TABS: 2.0000 mg | ORAL_TABLET | Freq: Three times a day (TID) | ORAL | 5 refills | Status: DC

## 2017-05-31 NOTE — Telephone Encounter (Signed)
Called and spoke with patient she wanted prescription to go to [facility]. Phone in [place] and discontinued Xanax. Understanding verbalized nothing further needed at this time.

## 2017-06-08 ENCOUNTER — Other Ambulatory Visit: Payer: Self-pay | Admitting: Family Medicine

## 2017-06-08 NOTE — Telephone Encounter (Signed)
Copied from [place] [id number]. Topic: Quick Communication - Rx Refill/Question >> Jun 08, 2017  9:57 AM Robina Ade, Helene Kelp D wrote: Medication: HYDROcodone-acetaminophen (NORCO) 10-325 MG tablet & LORazepam (ATIVAN) 2 MG tablet  Has the patient contacted their pharmacy? Yes, but pharmacy called because they have questions on if patient still needs to be taking both medication.  (Agent: If no, request that the patient contact the pharmacy for the refill.) (Agent: If yes, when and what did the pharmacy advise?)  Preferred Pharmacy (with phone number or street name): [place], [place]  Agent: Please be advised that RX refills may take up to 3 business days. We ask that you follow-up with your pharmacy.

## 2017-06-08 NOTE — Telephone Encounter (Signed)
Rx refill request:     hydrocodone- acetaminophen 10-325 mg  Last filled: 04/12/17 # 120                                 Lorazepam 2 mg  Last filled: 05/31/17 # 90  LOV: 04/29/17  PCP: Sarajane Jews  Pharmacy: verified  Pharmacy has questions about medication safety for patient

## 2017-06-09 NOTE — Telephone Encounter (Signed)
Called and spoke to pt's husband. Husband advised that the Norco was sent to [facility] and she should have refills and the ativan was last refilled 05/31/2017 disp 90 with 5 refill sent to [place] [facility].

## 2017-06-14 ENCOUNTER — Ambulatory Visit (INDEPENDENT_AMBULATORY_CARE_PROVIDER_SITE_OTHER): Payer: Medicare Other | Admitting: Family Medicine

## 2017-06-14 ENCOUNTER — Encounter: Payer: Self-pay | Admitting: Family Medicine

## 2017-06-14 VITALS — BP 92/60 | HR 121 | Temp 98.0°F | Ht 64.0 in | Wt 131.6 lb

## 2017-06-14 DIAGNOSIS — M7918 Myalgia, other site: Secondary | ICD-10-CM | POA: Diagnosis not present

## 2017-06-14 DIAGNOSIS — G4700 Insomnia, unspecified: Secondary | ICD-10-CM

## 2017-06-14 DIAGNOSIS — K581 Irritable bowel syndrome with constipation: Secondary | ICD-10-CM | POA: Diagnosis not present

## 2017-06-14 DIAGNOSIS — E039 Hypothyroidism, unspecified: Secondary | ICD-10-CM

## 2017-06-14 DIAGNOSIS — F909 Attention-deficit hyperactivity disorder, unspecified type: Secondary | ICD-10-CM | POA: Diagnosis not present

## 2017-06-14 DIAGNOSIS — F411 Generalized anxiety disorder: Secondary | ICD-10-CM

## 2017-06-14 DIAGNOSIS — E782 Mixed hyperlipidemia: Secondary | ICD-10-CM | POA: Diagnosis not present

## 2017-06-14 LAB — LIPID PANEL
CHOLESTEROL: 272 mg/dL — AB (ref 0–200)
HDL: 62.9 mg/dL (ref >39.00)
LDL CALC: 172 mg/dL — AB (ref 0–99)
NonHDL: 208.86
Total CHOL/HDL Ratio: 4
Triglycerides: 185 mg/dL — ABNORMAL HIGH (ref 0.0–149.0)
VLDL: 37 mg/dL (ref 0.0–40.0)

## 2017-06-14 LAB — CBC WITH DIFFERENTIAL/PLATELET
BASOS ABS: 0 10*3/uL (ref 0.0–0.1)
Basophils Relative: 0.5 % (ref 0.0–3.0)
EOS ABS: 0.1 10*3/uL (ref 0.0–0.7)
Eosinophils Relative: 2.2 % (ref 0.0–5.0)
HCT: 42.3 % (ref 36.0–46.0)
Hemoglobin: 14.3 g/dL (ref 12.0–15.0)
LYMPHS ABS: 2.9 10*3/uL (ref 0.7–4.0)
Lymphocytes Relative: 48.3 % — ABNORMAL HIGH (ref 12.0–46.0)
MCHC: 33.9 g/dL (ref 30.0–36.0)
MCV: 96.3 fl (ref 78.0–100.0)
Monocytes Absolute: 0.4 10*3/uL (ref 0.1–1.0)
Monocytes Relative: 6.8 % (ref 3.0–12.0)
NEUTROS ABS: 2.5 10*3/uL (ref 1.4–7.7)
NEUTROS PCT: 42.2 % — AB (ref 43.0–77.0)
PLATELETS: 301 10*3/uL (ref 150.0–400.0)
RBC: 4.39 Mil/uL (ref 3.87–5.11)
RDW: 13.5 % (ref 11.5–15.5)
WBC: 6 10*3/uL (ref 4.0–10.5)

## 2017-06-14 LAB — BASIC METABOLIC PANEL
BUN: 11 mg/dL (ref 6–23)
CO2: 29 mEq/L (ref 19–32)
CREATININE: 0.89 mg/dL (ref 0.40–1.20)
Calcium: 10.4 mg/dL (ref 8.4–10.5)
Chloride: 102 mEq/L (ref 96–112)
GFR: 65.94 mL/min (ref >60.00)
GLUCOSE: 89 mg/dL (ref 70–99)
POTASSIUM: 4.1 meq/L (ref 3.5–5.1)
Sodium: 139 mEq/L (ref 135–145)

## 2017-06-14 LAB — POC URINALSYSI DIPSTICK (AUTOMATED)
BILIRUBIN UA: NEGATIVE
Blood, UA: NEGATIVE
Glucose, UA: NEGATIVE
KETONES UA: NEGATIVE
Leukocytes, UA: NEGATIVE
NITRITE UA: NEGATIVE
PH UA: 8 (ref 5.0–8.0)
Protein, UA: NEGATIVE
Spec Grav, UA: 1.01 (ref 1.010–1.025)
Urobilinogen, UA: 0.2 E.U./dL

## 2017-06-14 LAB — HEPATIC FUNCTION PANEL
ALT: 12 U/L (ref 0–35)
AST: 18 U/L (ref 0–37)
Albumin: 4.1 g/dL (ref 3.5–5.2)
Alkaline Phosphatase: 64 U/L (ref 39–117)
Bilirubin, Direct: 0.1 mg/dL (ref 0.0–0.3)
Total Bilirubin: 0.4 mg/dL (ref 0.2–1.2)
Total Protein: 6.9 g/dL (ref 6.0–8.3)

## 2017-06-14 LAB — TSH: TSH: 0.62 u[IU]/mL (ref 0.35–4.50)

## 2017-06-14 MED ORDER — METHYLPHENIDATE HCL 20 MG PO TABS: 20.0000 mg | ORAL_TABLET | Freq: Two times a day (BID) | ORAL | 0 refills | Status: DC

## 2017-06-14 NOTE — Progress Notes (Signed)
   Subjective:    Patient ID: Linda Kent, female    DOB: [birth year], 72 y.o.   MRN: [id number]  HPI Here to follow up on issues. Her myofascial pain syndrome is a daily struggle for her but her medications help a lot. Her anxiety is stable. She sleeps well most of the time. She is pleased with how the Ritalin helps her. She tries to watch her diet. She does mention frequent constipation even though she drinks plenty of water daily.    Review of Systems  Constitutional: Negative.   HENT: Negative.   Eyes: Negative.   Respiratory: Negative.   Cardiovascular: Negative.   Gastrointestinal: Positive for constipation. Negative for abdominal pain, anal bleeding, blood in stool, nausea, rectal pain and vomiting.  Genitourinary: Negative for decreased urine volume, difficulty urinating, dyspareunia, dysuria, enuresis, flank pain, frequency, hematuria, pelvic pain and urgency.  Musculoskeletal: Positive for myalgias. Negative for back pain, gait problem and joint swelling.  Skin: Negative.   Neurological: Negative.   Psychiatric/Behavioral: Negative.        Objective:   Physical Exam  Constitutional: She is oriented to person, place, and time. She appears well-developed and well-nourished. No distress.  HENT:  Head: Normocephalic and atraumatic.  Right Ear: External ear normal.  Left Ear: External ear normal.  Nose: Nose normal.  Mouth/Throat: Oropharynx is clear and moist. No oropharyngeal exudate.  Eyes: Pupils are equal, round, and reactive to light. Conjunctivae and EOM are normal. No scleral icterus.  Neck: Normal range of motion. Neck supple. No JVD present. No thyromegaly present.  Cardiovascular: Normal rate, regular rhythm, normal heart sounds and intact distal pulses. Exam reveals no gallop and no friction rub.  No murmur heard. Pulmonary/Chest: Effort normal and breath sounds normal. No respiratory distress. She has no wheezes. She has no rales. She exhibits no tenderness.    Abdominal: Soft. Bowel sounds are normal. She exhibits no distension and no mass. There is no tenderness. There is no rebound and no guarding.  Musculoskeletal: Normal range of motion. She exhibits no edema or tenderness.  Lymphadenopathy:    She has no cervical adenopathy.  Neurological: She is alert and oriented to person, place, and time. She has normal reflexes. She displays normal reflexes. No cranial nerve deficit. She exhibits normal muscle tone. Coordination normal.  Skin: Skin is warm and dry. No rash noted. No erythema.  Psychiatric: She has a normal mood and affect. Her behavior is normal. Judgment and thought content normal.          Assessment & Plan:  Her pain syndrome is stable. Her anxiety and insomnia are stable. Get fasting labs to check lipids, etc. For her constipation she will try Miralax daily.  Alysia Penna, MD

## 2017-06-20 ENCOUNTER — Other Ambulatory Visit: Payer: Self-pay | Admitting: *Deleted

## 2017-06-20 DIAGNOSIS — E782 Mixed hyperlipidemia: Secondary | ICD-10-CM

## 2017-06-20 MED ORDER — ATORVASTATIN CALCIUM 20 MG PO TABS: 20.0000 mg | ORAL_TABLET | Freq: Every day | ORAL | 3 refills | Status: DC

## 2017-07-14 ENCOUNTER — Other Ambulatory Visit: Payer: Self-pay | Admitting: Family Medicine

## 2017-07-15 ENCOUNTER — Ambulatory Visit (INDEPENDENT_AMBULATORY_CARE_PROVIDER_SITE_OTHER): Payer: Medicare Other | Admitting: Family Medicine

## 2017-07-15 ENCOUNTER — Encounter: Payer: Self-pay | Admitting: Family Medicine

## 2017-07-15 VITALS — BP 108/80 | HR 87 | Temp 97.7°F | Ht 64.0 in | Wt 130.8 lb

## 2017-07-15 DIAGNOSIS — F1190 Opioid use, unspecified, uncomplicated: Secondary | ICD-10-CM | POA: Diagnosis not present

## 2017-07-15 DIAGNOSIS — E782 Mixed hyperlipidemia: Secondary | ICD-10-CM

## 2017-07-15 DIAGNOSIS — M7918 Myalgia, other site: Secondary | ICD-10-CM

## 2017-07-15 MED ORDER — OXYCODONE HCL 20 MG PO TABS: 20.0000 mg | ORAL_TABLET | ORAL | 0 refills | Status: DC | PRN

## 2017-07-15 MED ORDER — METHYLPHENIDATE HCL 20 MG PO TABS: 20.0000 mg | ORAL_TABLET | Freq: Two times a day (BID) | ORAL | 0 refills | Status: DC

## 2017-07-15 MED ORDER — HYDROCODONE-ACETAMINOPHEN 10-325 MG PO TABS: 1.0000 | ORAL_TABLET | ORAL | 0 refills | Status: DC | PRN

## 2017-07-15 NOTE — Telephone Encounter (Signed)
Last OV 06/20/2017   Last refilled 02/22/2017 disp 90 with 1 refill   Sent to PCP to advise

## 2017-07-15 NOTE — Progress Notes (Signed)
   Subjective:    Patient ID: ESTERLENE ATIYEH, female    DOB: 02-28-1945, 72 y.o.   MRN: [id number]  HPI Here for pain management. She is doing well. She had to stop taking Lipitor because she felt it caused difficulty breathing. She has also tried Zetia in the past and it caused muscle pains.  Indication for chronic opioid: myofascial pain  Medication and dose: Norco 10-325 and Oxycodone # pills per month: 120 and 180  Last UDS date: 01-12-17 Opioid Treatment Agreement signed (Y/N): 04-12-17 Opioid Treatment Agreement last reviewed with patient:  07-15-17 [place] reviewed this encounter (include red flags):  07-15-17    Review of Systems  Constitutional: Negative.   Respiratory: Negative.   Cardiovascular: Negative.   Musculoskeletal: Positive for myalgias.  Neurological: Negative.        Objective:   Physical Exam  Constitutional: She is oriented to person, place, and time. She appears well-developed and well-nourished.  Cardiovascular: Normal rate, regular rhythm, normal heart sounds and intact distal pulses.  Pulmonary/Chest: Effort normal and breath sounds normal.  Neurological: She is alert and oriented to person, place, and time.          Assessment & Plan:  For pain management, meds were refilled. For her high cholesterol we will refer her to the [facility]. Alysia Penna, MD

## 2017-08-02 ENCOUNTER — Ambulatory Visit: Payer: Self-pay

## 2017-08-02 NOTE — Telephone Encounter (Signed)
Incoming call from patient.  Patient states she is stressed due to upcoming appointment with new cardiologist and report that her cholesterol report is elevated. Was taken off of [facility] r/t them making her "heart have pain".  Patient states that this has caused stress in her life now and desires to make an appointment with Dr. Sarajane Jews earnestly before  the new appointment.  Pain is in the chest. Denies any radiation.  Started two weeks ago.  States it is a little  low dull pain.  Rate the pain mild.  Positive for elevated cholesterol. Denies Pulmonary  Risk factors. Feels that stress is causing this. Patient states that she gets weak when this happens.  Provided care advice per protocol. Appointment schedule for 08/12/17 2:30 with Dr.  Sarajane Jews. Pt. Very happy about appointment and voiced understanding.   Reason for Disposition . [1] Chest pain(s) lasting a few seconds AND [2] persists > 3 days  Answer Assessment - Initial Assessment Questions 1. LOCATION: "Where does it hurt?"       chest 2. RADIATION: "Does the pain go anywhere else?" (e.g., into neck, jaw, arms, back)     no 3. ONSET: "When did the chest pain begin?" (Minutes, hours or days)      2 weeks ago 4. PATTERN "Does the pain come and go, or has it been constant since it started?"  "Does it get worse with exertion?"       Yes low dull ache 5. DURATION: "How long does it last" (e.g., seconds, minutes, hours)     Low little dull pain 6. SEVERITY: "How bad is the pain?"  (e.g., Scale 1-10; mild, moderate, or severe)    - MILD (1-3): doesn't interfere with normal activities     - MODERATE (4-7): interferes with normal activities or awakens from sleep    - SEVERE (8-10): excruciating pain, unable to do any normal activities       mild 7. CARDIAC RISK FACTORS: "Do you have any history of heart problems or risk factors for heart disease?" (e.g., prior heart attack, angina; high blood pressure, diabetes, being overweight, high cholesterol, smoking, or  strong family history of heart disease)     High cholesterol 8. PULMONARY RISK FACTORS: "Do you have any history of lung disease?"  (e.g., blood clots in lung, asthma, emphysema, birth control pills)     no 9. CAUSE: "What do you think is causing the chest pain?"     stree 10. OTHER SYMPTOMS: "Do you have any other symptoms?" (e.g., dizziness, nausea, vomiting, sweating, fever, difficulty breathing, cough)        " I get  Weak" 11. PREGNANCY: "Is there any chance you are pregnant?" "When was your last menstrual period?"      na  Protocols used: CHEST PAIN-A-AH

## 2017-08-08 ENCOUNTER — Other Ambulatory Visit: Payer: Self-pay | Admitting: Family Medicine

## 2017-08-08 NOTE — Telephone Encounter (Signed)
Last OV 07/15/2017   Last refilled 02/22/2017 disp 90 with 1 refill   Will need to call pt to advise that PCP is NOT in the office and will back on Thursday is pt able to wait until then?

## 2017-08-08 NOTE — Telephone Encounter (Signed)
Called and spoke with pt. Pt stated that she is not able to wait until Dr. Sarajane Jews returns.   Sent to [facility] for approval to refill while PCP is out of office.   Pt would like the refill sent to mailorder if not OK to send a short term Rx to local pharmacy

## 2017-08-09 ENCOUNTER — Telehealth: Payer: Self-pay | Admitting: Family Medicine

## 2017-08-09 NOTE — Telephone Encounter (Signed)
Called and spoke with pt. Pt is due to have this medication refilled. Pt will need this done first thing when Dr. Sarajane Jews returns to the office. Sent to mail order [facility]   Pt is OK to wait until PCP returns to the office. Call pt once this have been done.   Sent to PCP to refill Sonata  Last OV 07/15/2017   Last refilled 02/22/2017  Disp 90 with 1 refill

## 2017-08-09 NOTE — Telephone Encounter (Signed)
Copied from [place] [id number]. Topic: Inquiry >> Aug 09, 2017 11:11 AM Conception Chancy, NT wrote: Reason for CRM: patient is requesting Wilburn Cornelia give her a call. She said it is in regards to her medication. zaleplon (SONATA) 10 MG capsule. She states she doesn't need a refill but wants to speak with her about it.

## 2017-08-09 NOTE — Telephone Encounter (Signed)
Can send / call in 14    Of medicine until dr Sarajane Jews back in  Office .

## 2017-08-10 NOTE — Telephone Encounter (Signed)
Called pt and left a VM pt was going to wait until the PCP returned to the office on Thursday to have this refilled pt stated that she would contact [facility] to have the Rx shipped over night since she is so close to running out. Pt stated that she would just have to pay an extra 12 bucks.   However, I still left a VM to ofer her a short term refill to local pharmacy today is she would like as well.   CRM created.

## 2017-08-10 NOTE — Telephone Encounter (Signed)
Sent to PCP to send in Rx to mail order on Thursday call pt to advise when done.

## 2017-08-10 NOTE — Telephone Encounter (Signed)
Pt would like to wait to have this refilled on Thursday and sent to mail order.

## 2017-08-11 MED ORDER — ZALEPLON 10 MG PO CAPS: 10.0000 mg | ORAL_CAPSULE | Freq: Every evening | ORAL | 1 refills | Status: DC | PRN

## 2017-08-11 NOTE — Telephone Encounter (Signed)
I sent this in to [facility]

## 2017-08-11 NOTE — Telephone Encounter (Signed)
Patient has been notified

## 2017-08-12 ENCOUNTER — Ambulatory Visit: Payer: Medicare Other | Admitting: Family Medicine

## 2017-08-15 ENCOUNTER — Other Ambulatory Visit: Payer: Self-pay | Admitting: *Deleted

## 2017-08-15 NOTE — Telephone Encounter (Signed)
Refill request received from pharmacy for this cream. I am not familiar with this and it has not been refilled since 2016. Ok to refill? Thanks!

## 2017-08-15 NOTE — Telephone Encounter (Signed)
Please refill this for one year  

## 2017-08-16 MED ORDER — NONFORMULARY OR COMPOUNDED ITEM: 6 refills | Status: DC

## 2017-08-25 ENCOUNTER — Telehealth: Payer: Self-pay

## 2017-08-25 NOTE — Telephone Encounter (Signed)
Called and spoke with pt. Pt advised and voiced understanding. She stated that she didn't relize but at her last OV Dr. Sarajane Jews did give her a written prescription that she has already taking to [facility] the do the compound cream there. Nothing else is needed further.

## 2017-08-25 NOTE — Telephone Encounter (Signed)
Fax from [facility] no longer dispenses compound medications. They apologized for any incovenience thi smay cause.   Medication: The non -or compound DI3/BAC2/CYC2/GABA6/TETR2 CREAM ( dispense 60 grams )  Sent to PCP to advise  call pt to advise and found out where they would like for this Rx to go

## 2017-08-26 ENCOUNTER — Encounter: Payer: Self-pay | Admitting: Internal Medicine

## 2017-08-26 ENCOUNTER — Ambulatory Visit (INDEPENDENT_AMBULATORY_CARE_PROVIDER_SITE_OTHER): Payer: Medicare Other | Admitting: Internal Medicine

## 2017-08-26 VITALS — BP 106/72 | HR 96 | Ht 64.0 in | Wt 132.6 lb

## 2017-08-26 DIAGNOSIS — E782 Mixed hyperlipidemia: Secondary | ICD-10-CM

## 2017-08-26 DIAGNOSIS — Z789 Other specified health status: Secondary | ICD-10-CM

## 2017-08-26 DIAGNOSIS — Z9189 Other specified personal risk factors, not elsewhere classified: Secondary | ICD-10-CM | POA: Diagnosis not present

## 2017-08-26 MED ORDER — ROSUVASTATIN CALCIUM 5 MG PO TABS: 5.0000 mg | ORAL_TABLET | ORAL | 5 refills | Status: DC

## 2017-08-26 NOTE — Patient Instructions (Signed)
Your physician has recommended you make the following change in your medication: START crestor 5mg  once weekly  Dr. Debara Pickett has ordered a CT coronary calcium score. This test is done at [address]. This is $150 out of pocket.   Your physician recommends that you schedule a follow-up appointment after your testing.    Coronary CalciumScan A coronary calcium scan is an imaging test used to look for deposits of calcium and other fatty materials (plaques) in the inner lining of the blood vessels of the heart (coronary arteries). These deposits of calcium and plaques can partly clog and narrow the coronary arteries without producing any symptoms or warning signs. This puts a person at risk for a heart attack. This test can detect these deposits before symptoms develop. Tell a health care provider about:  Any allergies you have.  All medicines you are taking, including vitamins, herbs, eye drops, creams, and over-the-counter medicines.  Any problems you or family members have had with anesthetic medicines.  Any blood disorders you have.  Any surgeries you have had.  Any medical conditions you have.  Whether you are pregnant or may be pregnant. What are the risks? Generally, this is a safe procedure. However, problems may occur, including:  Harm to a pregnant woman and her unborn baby. This test involves the use of radiation. Radiation exposure can be dangerous to a pregnant woman and her unborn baby. If you are pregnant, you generally should not have this procedure done.  Slight increase in the risk of cancer. This is because of the radiation involved in the test. What happens before the procedure? No preparation is needed for this procedure. What happens during the procedure?  You will undress and remove any jewelry around your neck or chest.  You will put on a hospital gown.  Sticky electrodes will be placed on your chest. The electrodes will be connected to an  electrocardiogram (ECG) machine to record a tracing of the electrical activity of your heart.  A CT scanner will take pictures of your heart. During this time, you will be asked to lie still and hold your breath for 2-3 seconds while a picture of your heart is being taken. The procedure may vary among health care providers and hospitals. What happens after the procedure?  You can get dressed.  You can return to your normal activities.  It is up to you to get the results of your test. Ask your health care provider, or the department that is doing the test, when your results will be ready. Summary  A coronary calcium scan is an imaging test used to look for deposits of calcium and other fatty materials (plaques) in the inner lining of the blood vessels of the heart (coronary arteries).  Generally, this is a safe procedure. Tell your health care provider if you are pregnant or may be pregnant.  No preparation is needed for this procedure.  A CT scanner will take pictures of your heart.  You can return to your normal activities after the scan is done. This information is not intended to replace advice given to you by your health care provider. Make sure you discuss any questions you have with your health care provider. Document Released: 07/03/2007 Document Revised: 11/24/2015 Document Reviewed: 11/24/2015 Elsevier Interactive Patient Education  2017 [employer].

## 2017-08-30 ENCOUNTER — Encounter: Payer: Self-pay | Admitting: Internal Medicine

## 2017-08-30 DIAGNOSIS — Z9189 Other specified personal risk factors, not elsewhere classified: Secondary | ICD-10-CM | POA: Insufficient documentation

## 2017-08-30 DIAGNOSIS — Z789 Other specified health status: Secondary | ICD-10-CM | POA: Insufficient documentation

## 2017-08-30 NOTE — Progress Notes (Signed)
OFFICE CONSULT NOTE  Chief Complaint:  Lipid evaluation  Primary Care Physician: Laurey Morale, MD  HPI:  Linda Kent is a 72 y.o. female who is being seen today for the evaluation of dyslipidemia at the request of Laurey Morale, MD. This is a pleasant 72 year old female who is kindly referred to me for evaluation of dyslipidemia.  Past medical history significant for dyslipidemia, hypothyroidism, irritable bowel syndrome and myofascial pain syndrome.  She reports a significant pain that is debilitating to the point where she gets very little exercise.  Recently she was noted to have dyslipidemia with total cholesterol of 237 a year ago, triglycerides 189, HDL 55 and calculated LDL 143.  This was reassessed a month ago indicated total cholesterol was increased to 272, triglycerides 185, HDL 62 and LDL 172.  It was recommended that she start on Lipitor 20 mg daily however she had significant intolerance to the medication causing worsening myofascial pain and it was discontinued.  Previously she had taken ezetimibe which caused her difficulty breathing and was discontinued.  Family history is difficult for her father who had an MI at age 73, however he had rheumatic heart disease.  Otherwise no significant early onset heart disease.  She runs a normal blood pressure and takes low-dose aspirin.  She reports her diet is fairly low in saturated fat however could be better.  Her exercise as mentioned is minimal.  PMHx:  Past Medical History:  Diagnosis Date  . Acne rosacea   . ADD (attention deficit disorder)   . Back pain   . Chronic insomnia   . Colon polyps   . Depression   . Hyperlipidemia   . Hypothyroidism    sees Dr. Elmarie Shiley   . IBS (irritable bowel syndrome)   . Migraines    sees Dr. Orie Rout   . Myofascial pain syndrome    sees Dr. Elta Guadeloupe Philips   . Osteoporosis    sees Dr. Elmarie Shiley   . Post menopausal syndrome   . Recurrent cystitis    after intercourse,  controlled with macrobid   . Routine gynecological examination    sees [facility]     Past Surgical History:  Procedure Laterality Date  . ABDOMINAL HYSTERECTOMY     parital for prolapse  . APPENDECTOMY    . COLONOSCOPY  2017   per Dr. Amedeo Plenty, adenomatous polyps, repeat in 5 yrs   . OVARIAN CYST REMOVAL     excised on left   . TONSILLECTOMY      FAMHx:  Family History  Problem Relation Age of Onset  . Other Brother        low serotonin levels   . Other Sister        low serotonin levels  . Colon polyps Sister   . Cancer Other 70       puncle decreased from colon cancer     SOCHx:   reports that she has never smoked. She has never used smokeless tobacco. She reports that she does not drink alcohol or use drugs.  ALLERGIES:  Allergies  Allergen Reactions  . Atorvastatin Calcium [Atorvastatin]     Caused chest pain, labored breathing, fatigue   . Cephalexin Rash    ROS: Pertinent items noted in HPI and remainder of comprehensive ROS otherwise negative.  HOME MEDS: Current Outpatient Medications on File Prior to Visit  Medication Sig Dispense Refill  . Ascorbic Acid (VITAMIN C) 1000 MG tablet Take 1,000 mg  by mouth 2 (two) times daily.     Marland Kitchen aspirin 81 MG EC tablet Take 81 mg by mouth daily.      . Calcium 1500 MG tablet Take 1,500 mg by mouth daily. With Vitamin D    . denosumab (PROLIA) 60 MG/ML SOLN injection Inject 60 mg into the skin every 6 (six) months. Administer in upper arm, thigh, or abdomen    . [START ON 09/14/2017] HYDROcodone-acetaminophen (NORCO) 10-325 MG tablet Take 1 tablet by mouth every 4 (four) hours as needed. 120 tablet 0  . levothyroxine (SYNTHROID, LEVOTHROID) 100 MCG tablet Take 100 mcg by mouth daily. Brand name only     . LORazepam (ATIVAN) 2 MG tablet Take 1 tablet (2 mg total) by mouth 3 (three) times daily. 90 tablet 5  . meclizine (ANTIVERT) 25 MG tablet Take 1 tablet (25 mg total) by mouth every 4 (four) hours as needed for  dizziness. 60 tablet 5  . Melatonin 3 MG CAPS Take 9 mg by mouth at bedtime.     Derrill Memo ON 09/14/2017] methylphenidate (RITALIN) 20 MG tablet Take 1 tablet (20 mg total) by mouth 2 (two) times daily. 60 tablet 0  . metroNIDAZOLE (METROCREAM) 0.75 % cream Apply 1 application topically daily. 60 g 5  . Multiple Vitamin (MULTIVITAMIN) tablet Take 1 tablet by mouth daily. Centrum 50 +    . NON FORMULARY     . NONFORMULARY OR COMPOUNDED ITEM APPLY A DIME SIZED AMOUNT TO AFFECTED AREA 4 TIMES DAILY AS NEEDED. 1 each 6  . [START ON 09/14/2017] Oxycodone HCl 20 MG TABS Take 1 tablet (20 mg total) by mouth every 4 (four) hours as needed. (Patient taking differently: Take 20 mg by mouth daily. Take 1 tablet up to 4 times a day for chronic pain) 180 tablet 0  . perphenazine 4 MG tablet Take 4 mg by mouth every 4 (four) hours as needed. For migraines    . Probiotic Product (ALIGN PO) Take 1 capsule by mouth daily.     . rizatriptan (MAXALT-MLT) 10 MG disintegrating tablet Take 10 mg by mouth as needed. May repeat in 2 hours if needed. Maximum 4 per week dr. Domingo Cocking    . topiramate (TOPAMAX) 50 MG tablet Take 150 mg by mouth daily.     . zaleplon (SONATA) 10 MG capsule Take 1 capsule (10 mg total) by mouth at bedtime as needed for sleep. 90 capsule 1  . fluconazole (DIFLUCAN) 150 MG tablet Take 1 tablet (150 mg total) by mouth daily. (Patient not taking: Reported on 07/15/2017) 5 tablet 2  . nitrofurantoin, macrocrystal-monohydrate, (MACROBID) 100 MG capsule Take 1 capsule (100 mg total) by mouth 2 (two) times daily. (Patient not taking: Reported on 07/15/2017) 14 capsule 0   No current facility-administered medications on file prior to visit.     LABS/IMAGING: No results found for this or any previous visit (from the past 48 hour(s)). No results found.  LIPID PANEL:    Component Value Date/Time   CHOL 272 (H) 06/14/2017 1107   TRIG 185.0 (H) 06/14/2017 1107   HDL 62.90 06/14/2017 1107   CHOLHDL 4  06/14/2017 1107   VLDL 37.0 06/14/2017 1107   LDLCALC 172 (H) 06/14/2017 1107   LDLDIRECT 131.9 09/21/2012 0959    WEIGHTS: Wt Readings from Last 3 Encounters:  08/26/17 132 lb 9.6 oz (60.1 kg)  07/15/17 130 lb 12.8 oz (59.3 kg)  06/14/17 131 lb 9.6 oz (59.7 kg)    VITALS: BP  106/72   Pulse 96   Ht 5\' 4"  (1.626 m)   Wt 132 lb 9.6 oz (60.1 kg)   SpO2 98%   BMI 22.76 kg/m   EXAM: General appearance: alert and no distress Neck: no carotid bruit, no JVD and thyroid not enlarged, symmetric, no tenderness/mass/nodules Lungs: clear to auscultation bilaterally Heart: regular rate and rhythm Abdomen: soft, non-tender; bowel sounds normal; no masses,  no organomegaly Extremities: extremities normal, atraumatic, no cyanosis or edema Pulses: 2+ and symmetric Skin: Skin color, texture, turgor normal. No rashes or lesions Neurologic: Grossly normal Psych: Pleasant  EKG: N/A  ASSESSMENT: 1. Mixed dyslipidemia 2. Possibly statin intolerant 3. Intermediate 10-year coronary risk  PLAN: 1. Linda Kent has marked dyslipidemia but no known coronary disease.  Although there is early onset heart disease in her father, he had valvular heart disease related to rheumatic fever and that is not inherited.  She is fairly sedentary and although she eats a reasonably low saturated fat diet, she is at risk for coronary disease.  I would estimate her ten-year cardiovascular risk in the intermediate category between 10 and 15%, therefore would suggest treatment to target LDL less than 70.  Unfortunately she may be statin intolerant.  She did not tolerate atorvastatin which worsened her myofascial pain.  In addition she had intolerance to ezetimibe in the past causing shortness of breath.  She may tolerate low-dose of rosuvastatin once weekly.  I recommend 5 mg weekly and we could consider increasing that to 3 times weekly or every other day if it is tolerated after 2 weeks of treatment.  We will try to  maximize treatment with this and if we cannot achieve goal, she would possible be a candidate for PCSK9 inhibitor.  I would recommend a coronary artery calcium score to better risk stratify her and assess for any coronary artery disease.  Thanks for the kind referral.  Follow-up with me after testing.  Pixie Casino, MD, [facility], [place] Director of the Advanced Lipid Disorders &  Cardiovascular Risk Reduction Clinic Diplomate of the American Board of Clinical Lipidology Attending Cardiologist  Direct Dial: [phone]  Fax: [phone]  Website:  [email or web address] 08/30/2017, 12:02 PM

## 2017-09-08 ENCOUNTER — Ambulatory Visit (INDEPENDENT_AMBULATORY_CARE_PROVIDER_SITE_OTHER)
Admission: RE | Admit: 2017-09-08 | Discharge: 2017-09-08 | Disposition: A | Discharge status: Home / Self Care | Payer: Self-pay | Source: Ambulatory Visit | Attending: Internal Medicine | Admitting: Internal Medicine

## 2017-09-08 DIAGNOSIS — E782 Mixed hyperlipidemia: Secondary | ICD-10-CM

## 2017-09-12 DIAGNOSIS — G43019 Migraine without aura, intractable, without status migrainosus: Secondary | ICD-10-CM | POA: Diagnosis not present

## 2017-09-12 DIAGNOSIS — G43719 Chronic migraine without aura, intractable, without status migrainosus: Secondary | ICD-10-CM | POA: Diagnosis not present

## 2017-09-15 ENCOUNTER — Telehealth: Payer: Self-pay | Admitting: Internal Medicine

## 2017-09-15 NOTE — Telephone Encounter (Signed)
New Message:     Pt is calling for test results

## 2017-09-15 NOTE — Telephone Encounter (Signed)
Called patient, gave test results.  Patient had no questions or concerns at this time.

## 2017-10-05 DIAGNOSIS — E039 Hypothyroidism, unspecified: Secondary | ICD-10-CM | POA: Diagnosis not present

## 2017-10-05 DIAGNOSIS — E559 Vitamin D deficiency, unspecified: Secondary | ICD-10-CM | POA: Diagnosis not present

## 2017-10-05 DIAGNOSIS — M810 Age-related osteoporosis without current pathological fracture: Secondary | ICD-10-CM | POA: Diagnosis not present

## 2017-10-13 ENCOUNTER — Ambulatory Visit: Payer: Medicare Other | Admitting: Internal Medicine

## 2017-10-14 ENCOUNTER — Encounter: Payer: Self-pay | Admitting: Family Medicine

## 2017-10-14 ENCOUNTER — Ambulatory Visit (INDEPENDENT_AMBULATORY_CARE_PROVIDER_SITE_OTHER): Payer: Medicare Other | Admitting: Family Medicine

## 2017-10-14 VITALS — BP 108/70 | HR 92 | Temp 97.5°F | Wt 135.2 lb

## 2017-10-14 DIAGNOSIS — F1190 Opioid use, unspecified, uncomplicated: Secondary | ICD-10-CM | POA: Diagnosis not present

## 2017-10-14 DIAGNOSIS — Z23 Encounter for immunization: Secondary | ICD-10-CM

## 2017-10-14 DIAGNOSIS — M7918 Myalgia, other site: Secondary | ICD-10-CM

## 2017-10-14 MED ORDER — OXYCODONE HCL 20 MG PO TABS: 20.0000 mg | ORAL_TABLET | ORAL | 0 refills | Status: DC | PRN

## 2017-10-14 MED ORDER — METHYLPHENIDATE HCL 20 MG PO TABS: 20.0000 mg | ORAL_TABLET | Freq: Two times a day (BID) | ORAL | 0 refills | Status: DC

## 2017-10-14 MED ORDER — HYDROCODONE-ACETAMINOPHEN 10-325 MG PO TABS: 1.0000 | ORAL_TABLET | Freq: Four times a day (QID) | ORAL | 0 refills | Status: AC | PRN

## 2017-10-14 MED ORDER — HYDROCODONE-ACETAMINOPHEN 10-325 MG PO TABS: 1.0000 | ORAL_TABLET | Freq: Four times a day (QID) | ORAL | 0 refills | Status: DC | PRN

## 2017-10-14 MED ORDER — OXYCODONE HCL 20 MG PO TABS: 20.0000 mg | ORAL_TABLET | ORAL | 0 refills | Status: AC | PRN

## 2017-10-14 NOTE — Progress Notes (Signed)
   Subjective:    Patient ID: Linda Kent, female    DOB: 28-Jan-1945, 72 y.o.   MRN: [id number]  HPI Here for pain management. She is doing well.  Indication for chronic opioid: myofascial pain   Medication and dose: Norco 10-325 and Oxycodone 20 mg  # pills per month: 120 and 180  Last UDS date: 01-12-17 Opioid Treatment Agreement signed (Y/N): 04-12-17 Opioid Treatment Agreement last reviewed with patient:  10-14-17 [place] reviewed this encounter (include red flags):  10-14-17     Review of Systems  Constitutional: Negative.   Respiratory: Negative.   Musculoskeletal: Positive for myalgias.  Neurological: Negative.        Objective:   Physical Exam  Constitutional: She is oriented to person, place, and time. She appears well-developed and well-nourished.  Cardiovascular: Normal rate, regular rhythm, normal heart sounds and intact distal pulses.  Pulmonary/Chest: Effort normal and breath sounds normal.  Neurological: She is alert and oriented to person, place, and time.          Assessment & Plan:  Pain management, meds were refilled. Alysia Penna, MD

## 2017-10-26 DIAGNOSIS — M810 Age-related osteoporosis without current pathological fracture: Secondary | ICD-10-CM | POA: Diagnosis not present

## 2017-11-04 ENCOUNTER — Other Ambulatory Visit: Payer: Self-pay | Admitting: *Deleted

## 2017-11-04 DIAGNOSIS — E782 Mixed hyperlipidemia: Secondary | ICD-10-CM

## 2017-11-04 DIAGNOSIS — Z789 Other specified health status: Secondary | ICD-10-CM

## 2017-11-07 ENCOUNTER — Other Ambulatory Visit: Payer: Self-pay | Admitting: Family Medicine

## 2017-11-08 DIAGNOSIS — Z789 Other specified health status: Secondary | ICD-10-CM | POA: Diagnosis not present

## 2017-11-08 DIAGNOSIS — E782 Mixed hyperlipidemia: Secondary | ICD-10-CM | POA: Diagnosis not present

## 2017-11-09 ENCOUNTER — Encounter: Payer: Self-pay | Admitting: Internal Medicine

## 2017-11-09 ENCOUNTER — Ambulatory Visit (INDEPENDENT_AMBULATORY_CARE_PROVIDER_SITE_OTHER): Payer: Medicare Other | Admitting: Internal Medicine

## 2017-11-09 VITALS — BP 112/64 | HR 92 | Ht 64.0 in | Wt 135.0 lb

## 2017-11-09 DIAGNOSIS — Z789 Other specified health status: Secondary | ICD-10-CM | POA: Diagnosis not present

## 2017-11-09 DIAGNOSIS — E782 Mixed hyperlipidemia: Secondary | ICD-10-CM | POA: Diagnosis not present

## 2017-11-09 LAB — LIPID PANEL
CHOL/HDL RATIO: 4.1 ratio (ref 0.0–4.4)
Cholesterol, Total: 235 mg/dL — ABNORMAL HIGH (ref 100–199)
HDL: 57 mg/dL (ref >39)
LDL CALC: 135 mg/dL — AB (ref 0–99)
TRIGLYCERIDES: 216 mg/dL — AB (ref 0–149)
VLDL CHOLESTEROL CAL: 43 mg/dL — AB (ref 5–40)

## 2017-11-09 NOTE — Patient Instructions (Signed)
Your physician recommends that you schedule a follow-up appointment as needed with Dr. Hilty.  

## 2017-11-09 NOTE — Progress Notes (Signed)
OFFICE CONSULT NOTE  Chief Complaint:  Follow-up dyslipidemia  Primary Care Physician: Laurey Morale, MD  HPI:  Linda Kent is a 72 y.o. female who is being seen today for the evaluation of dyslipidemia at the request of Laurey Morale, MD. This is a pleasant 72 year old female who is kindly referred to me for evaluation of dyslipidemia.  Past medical history significant for dyslipidemia, hypothyroidism, irritable bowel syndrome and myofascial pain syndrome.  She reports a significant pain that is debilitating to the point where she gets very little exercise.  Recently she was noted to have dyslipidemia with total cholesterol of 237 a year ago, triglycerides 189, HDL 55 and calculated LDL 143.  This was reassessed a month ago indicated total cholesterol was increased to 272, triglycerides 185, HDL 62 and LDL 172.  It was recommended that she start on Lipitor 20 mg daily however she had significant intolerance to the medication causing worsening myofascial pain and it was discontinued.  Previously she had taken ezetimibe which caused her difficulty breathing and was discontinued.  Family history is difficult for her father who had an MI at age 85, however he had rheumatic heart disease.  Otherwise no significant early onset heart disease.  She runs a normal blood pressure and takes low-dose aspirin.  She reports her diet is fairly low in saturated fat however could be better.  Her exercise as mentioned is minimal.  11/09/2017  Linda Kent is seen today in follow-up.  She has been trying Crestor low-dose however has almost the exact same symptoms she had with the other statin medications.  It has made a difference in her cholesterol numbers as her LDL is down from 172-135.  We did send her for calcium scoring and her coronary artery calcium score was 0.  This is reassuring and takes a low risk of events despite her traditional cardiac risk being intermediate at 10 years.   PMHx:  Past Medical  History:  Diagnosis Date  . Acne rosacea   . ADD (attention deficit disorder)   . Back pain   . Chronic insomnia   . Colon polyps   . Depression   . Hyperlipidemia   . Hypothyroidism    sees Dr. Elmarie Shiley   . IBS (irritable bowel syndrome)   . Migraines    sees Dr. Orie Rout   . Myofascial pain syndrome    sees Dr. Elta Guadeloupe Philips   . Osteoporosis    sees Dr. Elmarie Shiley   . Post menopausal syndrome   . Recurrent cystitis    after intercourse, controlled with macrobid   . Routine gynecological examination    sees [facility]     Past Surgical History:  Procedure Laterality Date  . ABDOMINAL HYSTERECTOMY     parital for prolapse  . APPENDECTOMY    . COLONOSCOPY  2017   per Dr. Amedeo Plenty, adenomatous polyps, repeat in 5 yrs   . OVARIAN CYST REMOVAL     excised on left   . TONSILLECTOMY      FAMHx:  Family History  Problem Relation Age of Onset  . Other Brother        low serotonin levels   . Other Sister        low serotonin levels  . Colon polyps Sister   . Cancer Other 70       puncle decreased from colon cancer     SOCHx:   reports that she has never smoked. She has  never used smokeless tobacco. She reports that she does not drink alcohol or use drugs.  ALLERGIES:  Allergies  Allergen Reactions  . Ezetimibe Other (See Comments)    Difficulty breathing  . Atorvastatin Calcium [Atorvastatin]     Caused chest pain, labored breathing, fatigue   . Cephalexin Rash    ROS: Pertinent items noted in HPI and remainder of comprehensive ROS otherwise negative.  HOME MEDS: Current Outpatient Medications on File Prior to Visit  Medication Sig Dispense Refill  . Ascorbic Acid (VITAMIN C) 1000 MG tablet Take 1,000 mg by mouth 2 (two) times daily.     Marland Kitchen aspirin 81 MG EC tablet Take 81 mg by mouth daily.      . Calcium 1500 MG tablet Take 1,500 mg by mouth daily. With Vitamin D    . denosumab (PROLIA) 60 MG/ML SOLN injection Inject 60 mg into the skin every 6  (six) months. Administer in upper arm, thigh, or abdomen    . [START ON 12/14/2017] HYDROcodone-acetaminophen (NORCO) 10-325 MG tablet Take 1 tablet by mouth every 6 (six) hours as needed for moderate pain. 120 tablet 0  . levothyroxine (SYNTHROID, LEVOTHROID) 100 MCG tablet Take 100 mcg by mouth daily. Brand name only     . LORazepam (ATIVAN) 2 MG tablet Take 1 tablet (2 mg total) by mouth 3 (three) times daily. 90 tablet 5  . Melatonin 3 MG CAPS Take 9 mg by mouth at bedtime.     Derrill Memo ON 12/14/2017] methylphenidate (RITALIN) 20 MG tablet Take 1 tablet (20 mg total) by mouth 2 (two) times daily. 60 tablet 0  . metroNIDAZOLE (METROCREAM) 0.75 % cream Apply 1 application topically daily. 60 g 5  . Multiple Vitamin (MULTIVITAMIN) tablet Take 1 tablet by mouth daily. Centrum 50 +    . NON FORMULARY     . NONFORMULARY OR COMPOUNDED ITEM APPLY A DIME SIZED AMOUNT TO AFFECTED AREA 4 TIMES DAILY AS NEEDED. 1 each 6  . [START ON 12/14/2017] Oxycodone HCl 20 MG TABS Take 1 tablet (20 mg total) by mouth every 4 (four) hours as needed (pain). 180 tablet 0  . perphenazine 4 MG tablet Take 4 mg by mouth every 4 (four) hours as needed. For migraines    . Probiotic Product (ALIGN PO) Take 1 capsule by mouth daily.     . rizatriptan (MAXALT-MLT) 10 MG disintegrating tablet Take 10 mg by mouth as needed. May repeat in 2 hours if needed. Maximum 4 per week dr. Domingo Cocking    . rosuvastatin (CRESTOR) 5 MG tablet Take 1 tablet (5 mg total) by mouth once a week. 5 tablet 5  . topiramate (TOPAMAX) 50 MG tablet Take 150 mg by mouth daily.     . zaleplon (SONATA) 10 MG capsule Take 1 capsule (10 mg total) by mouth at bedtime as needed for sleep. 90 capsule 1  . fluconazole (DIFLUCAN) 150 MG tablet Take 1 tablet (150 mg total) by mouth daily. (Patient not taking: Reported on 11/09/2017) 5 tablet 2  . meclizine (ANTIVERT) 25 MG tablet Take 1 tablet (25 mg total) by mouth every 4 (four) hours as needed for dizziness.  (Patient not taking: Reported on 11/09/2017) 60 tablet 5  . nitrofurantoin, macrocrystal-monohydrate, (MACROBID) 100 MG capsule Take 1 capsule (100 mg total) by mouth 2 (two) times daily. (Patient not taking: Reported on 11/09/2017) 14 capsule 0   No current facility-administered medications on file prior to visit.     LABS/IMAGING: Results for orders  placed or performed in visit on 11/04/17 (from the past 48 hour(s))  Lipid panel     Status: Abnormal   Collection Time: 11/08/17 10:52 AM  Result Value Ref Range   Cholesterol, Total 235 (H) 100 - 199 mg/dL   Triglycerides 216 (H) 0 - 149 mg/dL   HDL 57 >39 mg/dL   VLDL Cholesterol Cal 43 (H) 5 - 40 mg/dL   LDL Calculated 135 (H) 0 - 99 mg/dL   Chol/HDL Ratio 4.1 0.0 - 4.4 ratio    Comment:                                   T. Chol/HDL Ratio                                             Men  Women                               1/2 Avg.Risk  3.4    3.3                                   Avg.Risk  5.0    4.4                                2X Avg.Risk  9.6    7.1                                3X Avg.Risk 23.4   11.0    No results found.  LIPID PANEL:    Component Value Date/Time   CHOL 235 (H) 11/08/2017 1052   TRIG 216 (H) 11/08/2017 1052   HDL 57 11/08/2017 1052   CHOLHDL 4.1 11/08/2017 1052   CHOLHDL 4 06/14/2017 1107   VLDL 37.0 06/14/2017 1107   LDLCALC 135 (H) 11/08/2017 1052   LDLDIRECT 131.9 09/21/2012 0959    WEIGHTS: Wt Readings from Last 3 Encounters:  11/09/17 135 lb (61.2 kg)  10/14/17 135 lb 4 oz (61.3 kg)  08/26/17 132 lb 9.6 oz (60.1 kg)    VITALS: BP 112/64   Pulse 92   Ht 5\' 4"  (1.626 m)   Wt 135 lb (61.2 kg)   SpO2 94%   BMI 23.17 kg/m   EXAM: Deferred  EKG: N/A  ASSESSMENT: 1. Mixed dyslipidemia -0 coronary artery calcium score (10/2017) 2. Possibly statin intolerant 3. Intermediate 10-year coronary risk  PLAN: Linda Kent has a 0 coronary artery calcium score.  Her lipid profile did  improve on rosuvastatin however she is intolerant to that medication.  Because she is at low risk based on her calcium scoring, I would not advocate for any statin therapy at this time.  0 coronary artery calcium predicts a very low 10-year event rate based on a high negative predictive value the study.  She should continue to work on strict diet and cholesterol management from that standpoint.  Follow-up can be with me as needed.  Thanks again for the kind referral.  Pixie Casino, MD, FACC, [place]  [facility] of the Advanced Lipid Disorders &  Cardiovascular Risk Reduction Clinic Diplomate of the American Board of Clinical Lipidology Attending Cardiologist  Direct Dial: [phone]  Fax: [phone]  Website:  [email or web address] 11/09/2017, 2:57 PM

## 2017-11-09 NOTE — Telephone Encounter (Signed)
Dr. Fry please advise. Thanks  

## 2017-11-29 ENCOUNTER — Other Ambulatory Visit: Payer: Self-pay | Admitting: Family Medicine

## 2017-12-08 NOTE — Telephone Encounter (Signed)
Dr fry please advise on refill of this medication.

## 2017-12-09 NOTE — Telephone Encounter (Signed)
Call in #90 with 5 rf 

## 2018-01-10 ENCOUNTER — Ambulatory Visit: Payer: Self-pay

## 2018-01-10 NOTE — Telephone Encounter (Signed)
Pt called to change her appointment date because she was afraid she could not make it because she had a slip and fall in her bathroom at home. Pt states she struch a toilet paper holder ans she fell and hit her mid back in the kidney area and her right hip.  She states the fall happened Dec 17th.  She rates her pain at 8.  She states she can move and walk it just hurts. She describes her bruises as normal and states the hip swelled and never bruised.  She has been using heat and taking a narcotic pain medication she takes for a chronic condition. Per protocol pt should be see at urgent care but pt refuses.  She states she will give it a few more days. Pt again urged to be seen at urgent care. Care advice read to patient. Patient verbalized understanding of all instructions. Reason for Disposition . [1] SEVERE pain (e.g., excruciating) AND [2] not improved 2 hours after pain medicine/ice packs . [1] SEVERE pain AND [2] not improved 2 hours after pain medicine/ice packs  Answer Assessment - Initial Assessment Questions 1. MECHANISM: "How did the injury happen?" (Consider the possibility of domestic violence or elder abuse)     Slip and fall 2. ONSET: "When did the injury happen?" (Minutes or hours ago)     Tuesday 17 th 3. LOCATION: "What part of the back is injured?"     Mid back around the waste and right hip 4. SEVERITY: "Can you move the back normally?"     Yes but painful 5. PAIN: "Is there any pain?" If so, ask: "How bad is the pain?"   (Scale 1-10; or mild, moderate, severe)     Sore 8 6. CORD SYMPTOMS: Any weakness or numbness of the arms or legs?"     no 7. SIZE: For cuts, bruises, or swelling, ask: "How large is it?" (e.g., inches or centimeters)     Normal bruises hip is swollen 8. TETANUS: For any breaks in the skin, ask: "When was the last tetanus booster?"     no 9. OTHER SYMPTOMS: "Do you have any other symptoms?" (e.g., abdominal pain, blood in urine)     no 10. PREGNANCY: "Is  there any chance you are pregnant?" "When was your last menstrual period?"      No  Answer Assessment - Initial Assessment Questions 1. MECHANISM: "How did the injury happen?" (e.g., twisting injury, direct blow)     Fall 17th 2. ONSET: "When did the injury happen?" (Minutes or hours ago)      Dec 17 3. LOCATION: "Where is the injury located?"      hip 4. APPEARANCE of INJURY: "What does the injury look like?"  (e.g., deformity of leg)     No just swelling 5. SEVERITY: "Can you put weight on that leg?" "Can you walk?"      yes 6. SIZE: For cuts, bruises, or swelling, ask: "How large is it?" (e.g., inches or centimeters;  entire joint)      unsure 7. PAIN: "Is there pain?" If so, ask: "How bad is the pain?"  (e.g., Scale 1-10; or mild, moderate, severe)     8  8. TETANUS: For any breaks in the skin, ask: "When was the last tetanus booster?"     n/a 9. OTHER SYMPTOMS: "Do you have any other symptoms?"      nausea from pain 10. PREGNANCY: "Is there any chance you are pregnant?" "When was your last  menstrual period?"    N/A  Protocols used: BACK INJURY-A-AH, HIP INJURY-A-AH

## 2018-01-13 ENCOUNTER — Ambulatory Visit: Payer: Medicare Other | Admitting: Family Medicine

## 2018-01-27 ENCOUNTER — Ambulatory Visit: Payer: Medicare Other | Admitting: Family Medicine

## 2018-02-13 ENCOUNTER — Encounter: Payer: Self-pay | Admitting: Family Medicine

## 2018-02-13 ENCOUNTER — Ambulatory Visit (INDEPENDENT_AMBULATORY_CARE_PROVIDER_SITE_OTHER): Payer: Medicare Other | Admitting: Family Medicine

## 2018-02-13 ENCOUNTER — Ambulatory Visit: Payer: Medicare Other | Admitting: Family Medicine

## 2018-02-13 VITALS — BP 118/82 | HR 82 | Temp 97.7°F | Wt 135.0 lb

## 2018-02-13 DIAGNOSIS — M7918 Myalgia, other site: Secondary | ICD-10-CM

## 2018-02-13 DIAGNOSIS — F1190 Opioid use, unspecified, uncomplicated: Secondary | ICD-10-CM

## 2018-02-13 MED ORDER — OXYCODONE HCL 20 MG PO TABS: 1.0000 | ORAL_TABLET | ORAL | 0 refills | Status: AC | PRN

## 2018-02-13 MED ORDER — METHYLPHENIDATE HCL 20 MG PO TABS: 20.0000 mg | ORAL_TABLET | Freq: Two times a day (BID) | ORAL | 0 refills | Status: DC

## 2018-02-13 MED ORDER — HYDROCODONE-ACETAMINOPHEN 10-325 MG PO TABS: 1.0000 | ORAL_TABLET | Freq: Four times a day (QID) | ORAL | 0 refills | Status: AC | PRN

## 2018-02-13 MED ORDER — OXYCODONE HCL 20 MG PO TABS: 1.0000 | ORAL_TABLET | ORAL | 0 refills | Status: DC | PRN

## 2018-02-13 MED ORDER — HYDROCODONE-ACETAMINOPHEN 10-325 MG PO TABS: 1.0000 | ORAL_TABLET | Freq: Four times a day (QID) | ORAL | 0 refills | Status: DC | PRN

## 2018-02-13 NOTE — Progress Notes (Signed)
   Subjective:    Patient ID: Linda Kent, female    DOB: 04/10/1945, 73 y.o.   MRN: [id number]  HPI Here for pain management. She is doing fairly well.  Indication for chronic opioid: myofascial pain  Medication and dose: Norco 10-325 and Oxycodone 20 mg # pills per month: 120 and 180  Last UDS date: 02-13-18 Opioid Treatment Agreement signed (Y/N): 04-12-17 Opioid Treatment Agreement last reviewed with patient:  02-13-18 [facility] reviewed this encounter (include red flags):  02-13-18   Review of Systems  Constitutional: Negative.   Respiratory: Negative.   Cardiovascular: Negative.   Musculoskeletal: Positive for myalgias.  Neurological: Negative.        Objective:   Physical Exam Constitutional:      Appearance: Normal appearance.  Cardiovascular:     Rate and Rhythm: Normal rate and regular rhythm.     Pulses: Normal pulses.     Heart sounds: Normal heart sounds.  Pulmonary:     Effort: Pulmonary effort is normal.     Breath sounds: Normal breath sounds.  Neurological:     General: No focal deficit present.     Mental Status: She is alert and oriented to person, place, and time.           Assessment & Plan:  Pain management, meds were refilled.  Alysia Penna, MD

## 2018-02-23 ENCOUNTER — Other Ambulatory Visit: Payer: Self-pay | Admitting: Family Medicine

## 2018-02-23 NOTE — Telephone Encounter (Signed)
Copied from [place]. Topic: Quick Communication - Rx Refill/Question >> Feb 23, 2018  9:40 AM Bea Graff, NT wrote: Medication: zaleplon (SONATA) 10 MG capsule  Has the patient contacted their pharmacy? Yes.   (Agent: If no, request that the patient contact the pharmacy for the refill.) (Agent: If yes, when and what did the pharmacy advise?)  Preferred Pharmacy (with phone number or street name): [facility], [place] [phone] (Phone) [phone] (Fax)    Agent: Please be advised that RX refills may take up to 3 business days. We ask that you follow-up with your pharmacy.

## 2018-02-23 NOTE — Telephone Encounter (Signed)
Requested medication (s) are due for refill today: yes  Requested medication (s) are on the active medication list: yes    Last refill: 08/11/17  #90  1 refill  Future visit scheduled yes  05/15/2018  Dr. Sarajane Jews  Notes to clinic: Not delegated  Requested Prescriptions  Pending Prescriptions Disp Refills   zaleplon (SONATA) 10 MG capsule 90 capsule 1    Sig: Take 1 capsule (10 mg total) by mouth at bedtime as needed for sleep.     Not Delegated - Psychiatry:  Anxiolytics/Hypnotics Failed - 02/23/2018  9:49 AM      Failed - This refill cannot be delegated      Failed - Urine Drug Screen completed in last 360 days.      Passed - Valid encounter within last 6 months    Recent Outpatient Visits          1 week ago Chronic narcotic use   [place] [facility] [place], [place]   4 months ago Chronic narcotic use   [facility] [place], [place]   7 months ago Chronic narcotic use   [facility], Ishmael Holter, MD   8 months ago Irritable bowel syndrome with constipation   [facility] at [place], MD   10 months ago Acute cystitis without hematuria   [facility], Ishmael Holter, MD      Future Appointments            In 2 months Laurey Morale, MD [employer] [facility] at [place], [place]   In 3 months Sarajane Jews, Ishmael Holter, MD [employer] [facility] at [place], [facility]

## 2018-02-27 NOTE — Telephone Encounter (Signed)
Pt called to advise she is completely out of medication and needs this sent in today please.

## 2018-02-27 NOTE — Telephone Encounter (Signed)
Dr. Fry please advise on refill of medication.  Thanks  

## 2018-02-28 NOTE — Telephone Encounter (Signed)
Call in #90 with one rf 

## 2018-03-02 MED ORDER — ZALEPLON 10 MG PO CAPS: 10.0000 mg | ORAL_CAPSULE | Freq: Every evening | ORAL | 1 refills | Status: DC | PRN

## 2018-03-02 NOTE — Telephone Encounter (Signed)
rx has been called to the pharmacy and left on the VM.

## 2018-03-06 DIAGNOSIS — G43719 Chronic migraine without aura, intractable, without status migrainosus: Secondary | ICD-10-CM | POA: Diagnosis not present

## 2018-03-06 DIAGNOSIS — G43019 Migraine without aura, intractable, without status migrainosus: Secondary | ICD-10-CM | POA: Diagnosis not present

## 2018-05-15 ENCOUNTER — Other Ambulatory Visit: Payer: Self-pay

## 2018-05-15 ENCOUNTER — Encounter: Payer: Self-pay | Admitting: Family Medicine

## 2018-05-15 ENCOUNTER — Ambulatory Visit (INDEPENDENT_AMBULATORY_CARE_PROVIDER_SITE_OTHER): Payer: Medicare Other | Admitting: Family Medicine

## 2018-05-15 DIAGNOSIS — F1190 Opioid use, unspecified, uncomplicated: Secondary | ICD-10-CM | POA: Diagnosis not present

## 2018-05-15 DIAGNOSIS — M7918 Myalgia, other site: Secondary | ICD-10-CM | POA: Diagnosis not present

## 2018-05-15 NOTE — Progress Notes (Signed)
   Subjective:    Patient ID: Linda Kent, female    DOB: 1945/06/04, 73 y.o.   MRN: [id number]  HPI Virtual Visit via Telephone Note  I connected with the patient on 05/15/18 at  1:00 PM EDT by telephone and verified that I am speaking with the correct person using two identifiers. We attempted to speak via D[employer] but we had technical difficulties with the video and audio.   I discussed the limitations, risks, security and privacy concerns of performing an evaluation and management service by telephone and the availability of in person appointments. I also discussed with the patient that there may be a patient responsible charge related to this service. The patient expressed understanding and agreed to proceed.  Location patient: home Location provider: work or home office Participants present for the call: patient, provider Patient did not have a visit in the prior 7 days to address this/these issue(s).   History of Present Illness: Here for pain management. She has good days and bad days as far as her muscle pain, but she is still pleased with the current medications.  Indication for chronic opioid: myofascial pain  Medication and dose: Norco 10-235 and Oxycodone 20 mg # pills per month: 120 and 180  Last UDS date: 02-08-18 Opioid Treatment Agreement signed (Y/N): 04-06-17 Opioid Treatment Agreement last reviewed with patient:  05-15-18 NCCSRS reviewed this encounter (include red flags):  05-15-18    Observations/Objective: Patient sounds cheerful and well on the phone. I do not appreciate any SOB. Speech and thought processing are grossly intact. Patient reported vitals:  Assessment and Plan: Pain management, meds were refilled.  Alysia Penna, MD   Follow Up Instructions:     [phone] 5-10 [phone] 11-20 [phone] 21-30 I did not refer this patient for an OV in the next 24 hours for this/these issue(s).  I discussed the assessment and treatment plan with the patient. The  patient was provided an opportunity to ask questions and all were answered. The patient agreed with the plan and demonstrated an understanding of the instructions.   The patient was advised to call back or seek an in-person evaluation if the symptoms worsen or if the condition fails to improve as anticipated.  I provided 15 minutes of non-face-to-face time during this encounter.   Alysia Penna, MD    Review of Systems     Objective:   Physical Exam        Assessment & Plan:

## 2018-05-19 ENCOUNTER — Telehealth: Payer: Self-pay | Admitting: Family Medicine

## 2018-05-19 MED ORDER — OXYCODONE HCL 20 MG PO TABS: 1.0000 | ORAL_TABLET | ORAL | 0 refills | Status: DC | PRN

## 2018-05-19 MED ORDER — HYDROCODONE-ACETAMINOPHEN 10-325 MG PO TABS: 1.0000 | ORAL_TABLET | Freq: Four times a day (QID) | ORAL | 0 refills | Status: DC | PRN

## 2018-05-19 NOTE — Telephone Encounter (Signed)
Copied from [place] [id number]. Topic: Quick Communication - Rx Refill/Question >> May 19, 2018 12:35 PM Blase Mess A wrote: Medication: HYDROcodone-acetaminophen ([place]) 10-325 MG tablet [[id number]]  ENDED  Oxycodone HCl 20 MG TABS [[id number]]  ENDED Has the patient contacted their pharmacy? Yes  (Agent: If no, request that the patient contact the pharmacy for the refill.) (Agent: If yes, when and what did the pharmacy advise?)  Preferred Pharmacy (with phone number or street name): [facility], [place] [phone] (Phone) [phone] (Fax)     Agent: Please be advised that RX refills may take up to 3 business days. We ask that you follow-up with your pharmacy.

## 2018-05-19 NOTE — Telephone Encounter (Signed)
Done

## 2018-05-22 DIAGNOSIS — M810 Age-related osteoporosis without current pathological fracture: Secondary | ICD-10-CM | POA: Diagnosis not present

## 2018-06-06 ENCOUNTER — Telehealth: Payer: Self-pay | Admitting: *Deleted

## 2018-06-06 NOTE — Telephone Encounter (Signed)
Called lmom for pt to call the office back.

## 2018-06-06 NOTE — Telephone Encounter (Signed)
Copied from [place] [id number]. Topic: Appointment Scheduling - Scheduling Inquiry for Clinic >> Jun 05, 2018  4:34 PM Rutherford Nail, [place] wrote: Reason for CRM: Patient calling and states that she is needing to schedule her medication management appointment. States that her next one is not due until August. Would like to know if there is any availability for 08/22/2018? Please advise. CB#: [phone] (requests calls to be after 11am.)

## 2018-06-21 ENCOUNTER — Encounter: Payer: Medicare Other | Admitting: Family Medicine

## 2018-06-30 ENCOUNTER — Telehealth: Payer: Self-pay | Admitting: Family Medicine

## 2018-06-30 NOTE — Telephone Encounter (Signed)
Dr. Sarajane Jews please advise if you can call the medication into [facility] [address]

## 2018-06-30 NOTE — Telephone Encounter (Signed)
Copied from [place] [id number]. Topic: Quick Communication - Rx Refill/Question >> Jun 30, 2018  9:37 AM Margot Ables wrote: Medication: Oxycodone HCl 20 MG TABS - pt normal pharmacy ([facility] on [address]) is not able to get in stock and pt has been waiting a week for her June order. It is not a transferable RX and pt will be out on Monday 6/15. Please sent to [facility] at [place] the patient contacted their pharmacy? yes  Preferred Pharmacy (with phone number or street name): [facility] [place], [place] [facility] [facility] [phone] (Phone) [phone] (Fax)

## 2018-07-03 MED ORDER — OXYCODONE HCL 20 MG PO TABS: 1.0000 | ORAL_TABLET | ORAL | 0 refills | Status: DC | PRN

## 2018-07-03 NOTE — Telephone Encounter (Signed)
Done

## 2018-08-01 ENCOUNTER — Ambulatory Visit (INDEPENDENT_AMBULATORY_CARE_PROVIDER_SITE_OTHER): Payer: Medicare Other | Admitting: Family Medicine

## 2018-08-01 ENCOUNTER — Encounter: Payer: Self-pay | Admitting: Family Medicine

## 2018-08-01 ENCOUNTER — Other Ambulatory Visit: Payer: Self-pay

## 2018-08-01 ENCOUNTER — Encounter: Payer: Medicare Other | Admitting: Family Medicine

## 2018-08-01 DIAGNOSIS — M7918 Myalgia, other site: Secondary | ICD-10-CM

## 2018-08-01 DIAGNOSIS — F1190 Opioid use, unspecified, uncomplicated: Secondary | ICD-10-CM | POA: Diagnosis not present

## 2018-08-01 MED ORDER — OXYCODONE HCL 20 MG PO TABS: 1.0000 | ORAL_TABLET | ORAL | 0 refills | Status: DC | PRN

## 2018-08-01 MED ORDER — METHYLPHENIDATE HCL 20 MG PO TABS: 20.0000 mg | ORAL_TABLET | Freq: Two times a day (BID) | ORAL | 0 refills | Status: DC

## 2018-08-01 MED ORDER — LORAZEPAM 2 MG PO TABS: 2.0000 mg | ORAL_TABLET | Freq: Three times a day (TID) | ORAL | 5 refills | Status: DC

## 2018-08-01 MED ORDER — HYDROCODONE-ACETAMINOPHEN 10-325 MG PO TABS: 1.0000 | ORAL_TABLET | Freq: Four times a day (QID) | ORAL | 0 refills | Status: DC | PRN

## 2018-08-01 MED ORDER — HYDROCODONE-ACETAMINOPHEN 10-325 MG PO TABS: 1.0000 | ORAL_TABLET | Freq: Four times a day (QID) | ORAL | 0 refills | Status: AC | PRN

## 2018-08-01 NOTE — Progress Notes (Signed)
   Subjective:    Patient ID: Linda Kent, female    DOB: 10-20-1945, 73 y.o.   MRN: [id number]  HPI Virtual Visit via Telephone Note  I connected with the patient on 08/01/18 at  2:30 PM EDT by telephone and verified that I am speaking with the correct person using two identifiers. We attempted to connect virtually but we had technical difficulties with the audio and video.     I discussed the limitations, risks, security and privacy concerns of performing an evaluation and management service by telephone and the availability of in person appointments. I also discussed with the patient that there may be a patient responsible charge related to this service. The patient expressed understanding and agreed to proceed.  Location patient: home Location provider: work or home office Participants present for the call: patient, provider Patient did not have a visit in the prior 7 days to address this/these issue(s).   History of Present Illness: Here for pain management. She has daily pain, some days worse than others.  Indication for chronic opioid: myofascial pain Medication and dose: Norco 10-325 and Oxycodone 20 mg  # pills per month: 120 and 180 Last UDS date: 02-08-18 Opioid Treatment Agreement signed (Y/N): 04-06-17 Opioid Treatment Agreement last reviewed with patient:  08-01-18 NCCSRS reviewed this encounter (include red flags):  08-01-18    Observations/Objective: Patient sounds cheerful and well on the phone. I do not appreciate any SOB. Speech and thought processing are grossly intact. Patient reported vitals:  Assessment and Plan: Pain management, meds were refilled.  Alysia Penna, MD   Follow Up Instructions:     [phone] 5-10 [phone] 11-20 9443 21-30 I did not refer this patient for an OV in the next 24 hours for this/these issue(s).  I discussed the assessment and treatment plan with the patient. The patient was provided an opportunity to ask questions and all were  answered. The patient agreed with the plan and demonstrated an understanding of the instructions.   The patient was advised to call back or seek an in-person evaluation if the symptoms worsen or if the condition fails to improve as anticipated.  I provided 16 minutes of non-face-to-face time during this encounter.   Alysia Penna, MD    Review of Systems     Objective:   Physical Exam        Assessment & Plan:

## 2018-08-21 ENCOUNTER — Other Ambulatory Visit: Payer: Self-pay | Admitting: Family Medicine

## 2018-08-22 ENCOUNTER — Ambulatory Visit: Payer: Medicare Other | Admitting: Family Medicine

## 2018-08-22 NOTE — Telephone Encounter (Signed)
Last filled 03/02/2018 Last OV 08/01/2018 Ok to fill?

## 2018-09-04 ENCOUNTER — Encounter: Payer: Self-pay | Admitting: Family Medicine

## 2018-09-04 ENCOUNTER — Ambulatory Visit (INDEPENDENT_AMBULATORY_CARE_PROVIDER_SITE_OTHER): Payer: Medicare Other | Admitting: Family Medicine

## 2018-09-04 ENCOUNTER — Telehealth: Payer: Self-pay | Admitting: Family Medicine

## 2018-09-04 VITALS — BP 124/70 | HR 108 | Temp 97.7°F | Wt 141.2 lb

## 2018-09-04 DIAGNOSIS — M7918 Myalgia, other site: Secondary | ICD-10-CM

## 2018-09-04 DIAGNOSIS — F411 Generalized anxiety disorder: Secondary | ICD-10-CM

## 2018-09-04 DIAGNOSIS — M810 Age-related osteoporosis without current pathological fracture: Secondary | ICD-10-CM

## 2018-09-04 DIAGNOSIS — G4700 Insomnia, unspecified: Secondary | ICD-10-CM

## 2018-09-04 DIAGNOSIS — F909 Attention-deficit hyperactivity disorder, unspecified type: Secondary | ICD-10-CM | POA: Diagnosis not present

## 2018-09-04 DIAGNOSIS — K581 Irritable bowel syndrome with constipation: Secondary | ICD-10-CM

## 2018-09-04 DIAGNOSIS — E039 Hypothyroidism, unspecified: Secondary | ICD-10-CM

## 2018-09-04 DIAGNOSIS — E782 Mixed hyperlipidemia: Secondary | ICD-10-CM | POA: Diagnosis not present

## 2018-09-04 LAB — CBC WITH DIFFERENTIAL/PLATELET
Basophils Absolute: 0 10*3/uL (ref 0.0–0.1)
Basophils Relative: 0.6 % (ref 0.0–3.0)
Eosinophils Absolute: 0.2 10*3/uL (ref 0.0–0.7)
Eosinophils Relative: 2.4 % (ref 0.0–5.0)
HCT: 42.3 % (ref 36.0–46.0)
Hemoglobin: 13.8 g/dL (ref 12.0–15.0)
Lymphocytes Relative: 38.9 % (ref 12.0–46.0)
Lymphs Abs: 3.1 10*3/uL (ref 0.7–4.0)
MCHC: 32.8 g/dL (ref 30.0–36.0)
MCV: 97.6 fl (ref 78.0–100.0)
Monocytes Absolute: 0.6 10*3/uL (ref 0.1–1.0)
Monocytes Relative: 7.7 % (ref 3.0–12.0)
Neutro Abs: 4 10*3/uL (ref 1.4–7.7)
Neutrophils Relative %: 50.4 % (ref 43.0–77.0)
Platelets: 349 10*3/uL (ref 150.0–400.0)
RBC: 4.33 Mil/uL (ref 3.87–5.11)
RDW: 13 % (ref 11.5–15.5)
WBC: 8 10*3/uL (ref 4.0–10.5)

## 2018-09-04 LAB — LIPID PANEL
Cholesterol: 290 mg/dL — ABNORMAL HIGH (ref 0–200)
HDL: 62.1 mg/dL (ref >39.00)
NonHDL: 227.9
Total CHOL/HDL Ratio: 5
Triglycerides: 210 mg/dL — ABNORMAL HIGH (ref 0.0–149.0)
VLDL: 42 mg/dL — ABNORMAL HIGH (ref 0.0–40.0)

## 2018-09-04 LAB — HEPATIC FUNCTION PANEL
ALT: 14 U/L (ref 0–35)
AST: 21 U/L (ref 0–37)
Albumin: 4.1 g/dL (ref 3.5–5.2)
Alkaline Phosphatase: 73 U/L (ref 39–117)
Bilirubin, Direct: 0.1 mg/dL (ref 0.0–0.3)
Total Bilirubin: 0.4 mg/dL (ref 0.2–1.2)
Total Protein: 6.7 g/dL (ref 6.0–8.3)

## 2018-09-04 LAB — POC URINALSYSI DIPSTICK (AUTOMATED)
Bilirubin, UA: NEGATIVE
Blood, UA: NEGATIVE
Glucose, UA: NEGATIVE
Ketones, UA: NEGATIVE
Leukocytes, UA: NEGATIVE
Nitrite, UA: NEGATIVE
Protein, UA: POSITIVE — AB
Spec Grav, UA: 1.015 (ref 1.010–1.025)
Urobilinogen, UA: 0.2 E.U./dL
pH, UA: 7 (ref 5.0–8.0)

## 2018-09-04 LAB — BASIC METABOLIC PANEL
BUN: 14 mg/dL (ref 6–23)
CO2: 27 mEq/L (ref 19–32)
Calcium: 9.5 mg/dL (ref 8.4–10.5)
Chloride: 103 mEq/L (ref 96–112)
Creatinine, Ser: 0.83 mg/dL (ref 0.40–1.20)
GFR: 67.02 mL/min (ref >60.00)
Glucose, Bld: 87 mg/dL (ref 70–99)
Potassium: 3.9 mEq/L (ref 3.5–5.1)
Sodium: 139 mEq/L (ref 135–145)

## 2018-09-04 LAB — LDL CHOLESTEROL, DIRECT: Direct LDL: 201 mg/dL

## 2018-09-04 LAB — TSH: TSH: 1.9 u[IU]/mL (ref 0.35–4.50)

## 2018-09-04 MED ORDER — OXYCODONE HCL 20 MG PO TABS: 1.0000 | ORAL_TABLET | ORAL | 0 refills | Status: DC | PRN

## 2018-09-04 MED ORDER — ZALEPLON 10 MG PO CAPS: 10.0000 mg | ORAL_CAPSULE | Freq: Every evening | ORAL | 1 refills | Status: DC | PRN

## 2018-09-04 MED ORDER — METHYLPHENIDATE HCL 20 MG PO TABS: 20.0000 mg | ORAL_TABLET | Freq: Two times a day (BID) | ORAL | 0 refills | Status: DC

## 2018-09-04 MED ORDER — HALOBETASOL PROPIONATE 0.05 % EX CREA: TOPICAL_CREAM | Freq: Two times a day (BID) | CUTANEOUS | 5 refills | Status: DC

## 2018-09-04 MED ORDER — LORAZEPAM 2 MG PO TABS: 2.0000 mg | ORAL_TABLET | Freq: Three times a day (TID) | ORAL | 5 refills | Status: DC

## 2018-09-04 NOTE — Telephone Encounter (Signed)
Spoke with the patient. She is aware. Nothing further needed.

## 2018-09-04 NOTE — Telephone Encounter (Signed)
Pt states she needs her Oxycodone HCl 20 MG TABS Today and not to start tomorrow or the 19th.  / please advise

## 2018-09-04 NOTE — Telephone Encounter (Signed)
Spoke with the patients pharmacy and was told that the Oxycodone Rx can not be filled until the 19th because of the class of medication it is.  Left message for patient to call back. CRM created.

## 2018-09-04 NOTE — Progress Notes (Signed)
   Subjective:    Patient ID: Linda Kent, female    DOB: 1945/06/24, 73 y.o.   MRN: [id number]  HPI Here to follow up on issues. She is doing reasonably well. She struggles with chronic pain but her current medications help. Her BMs are regular by drinking water and eating lots of cantaloupe. Her headaches are stable. She is due for a DEXA. Her ADHD is stable.    Review of Systems  Constitutional: Negative.   HENT: Negative.   Eyes: Negative.   Respiratory: Negative.   Cardiovascular: Negative.   Gastrointestinal: Negative.   Genitourinary: Negative for decreased urine volume, difficulty urinating, dyspareunia, dysuria, enuresis, flank pain, frequency, hematuria, pelvic pain and urgency.  Musculoskeletal: Positive for arthralgias and myalgias.  Skin: Negative.   Neurological: Positive for headaches.  Psychiatric/Behavioral: Negative.        Objective:   Physical Exam Constitutional:      General: She is not in acute distress.    Appearance: She is well-developed.  HENT:     Head: Normocephalic and atraumatic.     Right Ear: External ear normal.     Left Ear: External ear normal.     Nose: Nose normal.     Mouth/Throat:     Pharynx: No oropharyngeal exudate.  Eyes:     General: No scleral icterus.    Conjunctiva/sclera: Conjunctivae normal.     Pupils: Pupils are equal, round, and reactive to light.  Neck:     Musculoskeletal: Normal range of motion and neck supple.     Thyroid: No thyromegaly.     Vascular: No JVD.  Cardiovascular:     Rate and Rhythm: Normal rate and regular rhythm.     Heart sounds: Normal heart sounds. No murmur. No friction rub. No gallop.   Pulmonary:     Effort: Pulmonary effort is normal. No respiratory distress.     Breath sounds: Normal breath sounds. No wheezing or rales.  Chest:     Chest wall: No tenderness.  Abdominal:     General: Bowel sounds are normal. There is no distension.     Palpations: Abdomen is soft. There is no mass.      Tenderness: There is no abdominal tenderness. There is no guarding or rebound.  Musculoskeletal: Normal range of motion.        General: No tenderness.  Lymphadenopathy:     Cervical: No cervical adenopathy.  Skin:    General: Skin is warm and dry.     Findings: No erythema or rash.  Neurological:     Mental Status: She is alert and oriented to person, place, and time.     Cranial Nerves: No cranial nerve deficit.     Motor: No abnormal muscle tone.     Coordination: Coordination normal.     Deep Tendon Reflexes: Reflexes are normal and symmetric. Reflexes normal.  Psychiatric:        Behavior: Behavior normal.        Thought Content: Thought content normal.        Judgment: Judgment normal.           Assessment & Plan:  Her pain and ADHD and IBS are stable. She will set up a mammogram. Get a DEXA. Get fasting labs to check lipids, etc.  Alysia Penna, MD

## 2018-09-11 DIAGNOSIS — G43719 Chronic migraine without aura, intractable, without status migrainosus: Secondary | ICD-10-CM | POA: Diagnosis not present

## 2018-09-11 DIAGNOSIS — G43019 Migraine without aura, intractable, without status migrainosus: Secondary | ICD-10-CM | POA: Diagnosis not present

## 2018-09-14 ENCOUNTER — Telehealth: Payer: Self-pay | Admitting: Family Medicine

## 2018-09-14 NOTE — Telephone Encounter (Signed)
Pt is requesting a call back from office to go over lab results. Please assist.

## 2018-09-15 NOTE — Telephone Encounter (Signed)
Patient was unable to come to the phone. She will call back later. Ok to give lab results from 09/04/2018.  CRM created.

## 2018-09-15 NOTE — Telephone Encounter (Signed)
Copied from [place] [id number]. Topic: Quick Communication - See Telephone Encounter >> Sep 15, 2018  9:43 AM Rebecca Eaton, CMA wrote: CRM for notification. See Telephone encounter for: 09/14/18. Ok to give patient her lab results. >> Sep 15, 2018 11:10 AM Mathis Bud wrote: Patient is returning lindsey's call to go over lab results. Patient call back [phone]

## 2018-09-15 NOTE — Telephone Encounter (Signed)
Spoke with the patient. She stated that she has been watching her diet and does not eat fatty foods or oil. She would like to know what else she can try.

## 2018-09-15 NOTE — Telephone Encounter (Signed)
Call in Crestor 5 mg every other day, #45 with 3 rf. Recheck labs in 90 days

## 2018-09-15 NOTE — Telephone Encounter (Signed)
Patient is allergic to this medication. Dr. Sarajane Jews has been made aware and Rx was not sent in. Dr. Sarajane Jews stated there is nothing more to be done at this point, just continue to watch her diet.   Left message for patient to call back. CRM created.

## 2018-09-15 NOTE — Telephone Encounter (Signed)
Patient called back. She is aware of Dr. Barbie Banner message. Nothing further needed.

## 2018-10-05 ENCOUNTER — Telehealth: Payer: Self-pay | Admitting: Family Medicine

## 2018-10-05 NOTE — Telephone Encounter (Signed)
Copied from [place] [id number]. Topic: General - Other >> Oct 05, 2018 12:05 PM Keene Breath wrote: Reason for CRM: Patient called to inform the doctor that the pharmacy asked that the doctor or nurse call them because they had some questions on the patient's medication for Oxycodone HCl 20 MG TABS and LORazepam (ATIVAN) 2 MG tablet.  [facility] [phone]

## 2018-10-06 NOTE — Telephone Encounter (Signed)
Called back but npo answer or vm just a long pause

## 2018-10-12 DIAGNOSIS — Z23 Encounter for immunization: Secondary | ICD-10-CM | POA: Diagnosis not present

## 2018-10-13 NOTE — Telephone Encounter (Signed)
Called pt again got busy signal

## 2018-10-16 NOTE — Telephone Encounter (Signed)
Please call the pharmacy and tell them I want her to be on both medications. She has been on them for years and is doing fine

## 2018-10-18 NOTE — Telephone Encounter (Signed)
This has been taking care of a message was left on pharmacy vm.

## 2018-11-07 ENCOUNTER — Telehealth: Payer: Medicare Other | Admitting: Family Medicine

## 2018-11-10 ENCOUNTER — Telehealth: Payer: Medicare Other | Admitting: Family Medicine

## 2018-11-10 ENCOUNTER — Telehealth (INDEPENDENT_AMBULATORY_CARE_PROVIDER_SITE_OTHER): Payer: Medicare Other | Admitting: Family Medicine

## 2018-11-10 ENCOUNTER — Other Ambulatory Visit: Payer: Self-pay

## 2018-11-10 DIAGNOSIS — M7918 Myalgia, other site: Secondary | ICD-10-CM | POA: Diagnosis not present

## 2018-11-10 NOTE — Progress Notes (Signed)
This visit type was conducted due to national recommendations for restrictions regarding the COVID-19 pandemic in an effort to limit this patient's exposure and mitigate transmission in our community.   Virtual Visit via Telephone Note  I connected with [name] on 11/10/18 at  3:00 PM EDT by telephone and verified that I am speaking with the correct person using two identifiers.   I discussed the limitations, risks, security and privacy concerns of performing an evaluation and management service by telephone and the availability of in person appointments. I also discussed with the patient that there may be a patient responsible charge related to this service. The patient expressed understanding and agreed to proceed.  Location patient: home Location provider: work or home office Participants present for the call: patient, provider Patient did not have a visit in the prior 7 days to address this/these issue(s).   History of Present Illness: Lannie for some reason was set up to see me today regarding her chronic pain management- though she is not due for refills for approximately 10 days.  She has chronic myofascial upper back pain.  This is mostly thoracic region.  She takes oxycodone and states she is taking 1 every 6 hours.  She states that even with the pain medication her pain is poorly controlled at times.  Her pain is more on the left side than the right.  She received 180 oxycodone on 10/10/2018.  She states she has about 40 tablets left.  She also has longstanding history of chronic anxiety   Observations/Objective: Patient sounds cheerful and well on the phone. I do not appreciate any SOB. Speech and thought processing are grossly intact. Patient reported vitals:  Assessment and Plan: Chronic myofascial back pain. -Patient is not out of medication yet and should not run out for another 9 days or so -Continue regular follow-up with primary regarding her chronic pain  syndrome  Follow Up Instructions:  -As above   [id number] 5-10 [id number] 11-20 99443 21-30 I did not refer this patient for an OV in the next 24 hours for this/these issue(s).  I discussed the assessment and treatment plan with the patient. The patient was provided an opportunity to ask questions and all were answered. The patient agreed with the plan and demonstrated an understanding of the instructions.   The patient was advised to call back or seek an in-person evaluation if the symptoms worsen or if the condition fails to improve as anticipated.  I provided 14 minutes of non-face-to-face time during this encounter.   Carolann Littler, MD

## 2018-11-13 ENCOUNTER — Telehealth: Payer: Self-pay | Admitting: Family Medicine

## 2018-11-13 NOTE — Telephone Encounter (Signed)
Okay for refill?  

## 2018-11-13 NOTE — Telephone Encounter (Signed)
rx refill Oxycodone HCl 20 MG TABS  [facility]Y [id number] [place], [place]. [phone] (Phone) [phone] (Fax)     Patient states she is down to 3 pills

## 2018-11-13 NOTE — Telephone Encounter (Signed)
She needs a PMV for this  

## 2018-11-14 DIAGNOSIS — E559 Vitamin D deficiency, unspecified: Secondary | ICD-10-CM | POA: Diagnosis not present

## 2018-11-14 DIAGNOSIS — M810 Age-related osteoporosis without current pathological fracture: Secondary | ICD-10-CM | POA: Diagnosis not present

## 2018-11-14 DIAGNOSIS — E039 Hypothyroidism, unspecified: Secondary | ICD-10-CM | POA: Diagnosis not present

## 2018-11-14 NOTE — Telephone Encounter (Signed)
Left message to return phone call.

## 2018-11-14 NOTE — Telephone Encounter (Signed)
Patient returned call. She advised her of message below. She is requesting a call back from [place].

## 2018-11-15 NOTE — Telephone Encounter (Signed)
This has been taking care of.

## 2018-11-16 ENCOUNTER — Telehealth: Payer: Self-pay | Admitting: Family Medicine

## 2018-11-16 ENCOUNTER — Encounter: Payer: Self-pay | Admitting: Family Medicine

## 2018-11-16 ENCOUNTER — Telehealth (INDEPENDENT_AMBULATORY_CARE_PROVIDER_SITE_OTHER): Payer: Medicare Other | Admitting: Family Medicine

## 2018-11-16 ENCOUNTER — Other Ambulatory Visit: Payer: Self-pay

## 2018-11-16 DIAGNOSIS — M7918 Myalgia, other site: Secondary | ICD-10-CM

## 2018-11-16 DIAGNOSIS — F1190 Opioid use, unspecified, uncomplicated: Secondary | ICD-10-CM | POA: Diagnosis not present

## 2018-11-16 MED ORDER — HYDROCODONE-ACETAMINOPHEN 10-325 MG PO TABS: 1.0000 | ORAL_TABLET | Freq: Four times a day (QID) | ORAL | 0 refills | Status: DC | PRN

## 2018-11-16 MED ORDER — OXYCODONE HCL 20 MG PO TABS: 1.0000 | ORAL_TABLET | ORAL | 0 refills | Status: AC | PRN

## 2018-11-16 MED ORDER — OXYCODONE HCL 20 MG PO TABS: 1.0000 | ORAL_TABLET | ORAL | 0 refills | Status: DC | PRN

## 2018-11-16 MED ORDER — HYDROCODONE-ACETAMINOPHEN 10-325 MG PO TABS: 1.0000 | ORAL_TABLET | Freq: Four times a day (QID) | ORAL | 0 refills | Status: AC | PRN

## 2018-11-16 NOTE — Progress Notes (Signed)
Virtual Visit via Telephone Note  I connected with the patient on 11/16/18 at  9:30 AM EDT by telephone and verified that I am speaking with the correct person using two identifiers. We attempted to connect virtually but we had technical difficulties with the audio and video.     I discussed the limitations, risks, security and privacy concerns of performing an evaluation and management service by telephone and the availability of in person appointments. I also discussed with the patient that there may be a patient responsible charge related to this service. The patient expressed understanding and agreed to proceed.  Location patient: home Location provider: work or home office Participants present for the call: patient, provider Patient did not have a visit in the prior 7 days to address this/these issue(s).   History of Present Illness: Here for pain management. She is doing well.  Indication for chronic opioid: myofascial pain Medication and dose: Norco 10-325 and Oxycodone 20 mg  # pills per month: 120 and 180 Last UDS date: 02-08-18 Opioid Treatment Agreement signed (Y/N): 04-06-17 Opioid Treatment Agreement last reviewed with patient:  11-16-18 [place] reviewed this encounter (include red flags): Yes    Observations/Objective: Patient sounds cheerful and well on the phone. I do not appreciate any SOB. Speech and thought processing are grossly intact. Patient reported vitals:  Assessment and Plan: Pain management, meds were refilled.  Alysia Penna, MD   Follow Up Instructions:     [phone] 5-10 [phone] 11-20 9443 21-30 I did not refer this patient for an OV in the next 24 hours for this/these issue(s).  I discussed the assessment and treatment plan with the patient. The patient was provided an opportunity to ask questions and all were answered. The patient agreed with the plan and demonstrated an understanding of the instructions.   The patient was advised to call back or  seek an in-person evaluation if the symptoms worsen or if the condition fails to improve as anticipated.  I provided 17 minutes of non-face-to-face time during this encounter.   Alysia Penna, MD

## 2018-11-16 NOTE — Telephone Encounter (Signed)
Copied from [place] [id number]. Topic: General - Other >> Nov 16, 2018 10:54 AM Keene Breath wrote: Reason for CRM: [facility]'s medication, HYDROcodone-acetaminophen (NORCO) 10-325 MG tablet.  Please call to discuss at [phone]

## 2018-11-16 NOTE — Telephone Encounter (Signed)
Called Alex back and was on hold for over 5 min. Will try again later.

## 2018-11-20 NOTE — Telephone Encounter (Signed)
Yes she takes both hydrocodone and oxycodone on different schedules

## 2018-11-20 NOTE — Telephone Encounter (Signed)
Spoke to another pharmacist and she state that Cristie Hem wanted to make sure the Rx was correct. The pharmacist stated that the pt just received Oxycodone 20mg  for 180 tab. Pt now has a refill for hydrocodone 10-325 due now.    Please advise if pt is to take both RX.

## 2018-11-21 NOTE — Telephone Encounter (Signed)
Spoke to [place] and he stated he was just calling to make sure Dr.Fry was aware that it was a 90 day Rx. I advised that he did. No further action needed!

## 2018-11-22 DIAGNOSIS — M810 Age-related osteoporosis without current pathological fracture: Secondary | ICD-10-CM | POA: Diagnosis not present

## 2018-12-13 ENCOUNTER — Other Ambulatory Visit: Payer: Self-pay

## 2019-01-16 ENCOUNTER — Telehealth: Payer: Self-pay | Admitting: *Deleted

## 2019-01-16 NOTE — Telephone Encounter (Signed)
Copied from [place] [id number]. Topic: General - Other >> Jan 16, 2019  2:07 PM Leward Quan A wrote: Reason for CRM: Patient was scheduled for an appointment on 02/15/2018 but would prefer the 11.30 AM virtual visit on the same day. Please schedule patient and inform Ph#  [phone]

## 2019-01-16 NOTE — Telephone Encounter (Signed)
Appointment switched to virtual. Informed patient she still may have to come into the office for a urine drug screen but Dr Sarajane Jews will make the call. Patient verbalized understanding.

## 2019-02-06 ENCOUNTER — Ambulatory Visit: Payer: Medicare Other | Attending: Internal Medicine

## 2019-02-06 ENCOUNTER — Ambulatory Visit: Payer: Medicare Other

## 2019-02-06 DIAGNOSIS — Z23 Encounter for immunization: Secondary | ICD-10-CM | POA: Diagnosis not present

## 2019-02-06 NOTE — Progress Notes (Signed)
   Covid-19 Vaccination Clinic  Name:  Linda Kent    MRN: [id number] DOB: Oct 14, 1945  02/06/2019  [name] was observed post Covid-19 immunization for 15 minutes without incidence. She was provided with Vaccine Information Sheet and instruction to access the V-Safe system.   [name] was instructed to call 911 with any severe reactions post vaccine: Marland Kitchen Difficulty breathing  . Swelling of your face and throat  . A fast heartbeat  . A bad rash all over your body  . Dizziness and weakness    Immunizations Administered    Name Date Dose VIS Date Route   Pfizer COVID-19 Vaccine 02/06/2019  9:37 AM 0.3 mL 12/29/2018 Intramuscular   Manufacturer: [place]   Lot: [id number]   Combine: [id number]

## 2019-02-16 ENCOUNTER — Telehealth (INDEPENDENT_AMBULATORY_CARE_PROVIDER_SITE_OTHER): Payer: Medicare Other | Admitting: Family Medicine

## 2019-02-16 ENCOUNTER — Ambulatory Visit: Payer: Medicare Other | Admitting: Family Medicine

## 2019-02-16 ENCOUNTER — Other Ambulatory Visit: Payer: Self-pay

## 2019-02-16 DIAGNOSIS — M7918 Myalgia, other site: Secondary | ICD-10-CM | POA: Diagnosis not present

## 2019-02-16 DIAGNOSIS — F1190 Opioid use, unspecified, uncomplicated: Secondary | ICD-10-CM

## 2019-02-16 MED ORDER — HYDROCODONE-ACETAMINOPHEN 10-325 MG PO TABS: 1.0000 | ORAL_TABLET | Freq: Four times a day (QID) | ORAL | 0 refills | Status: DC | PRN

## 2019-02-16 MED ORDER — LORAZEPAM 2 MG PO TABS: 2.0000 mg | ORAL_TABLET | Freq: Three times a day (TID) | ORAL | 5 refills | Status: DC

## 2019-02-16 MED ORDER — OXYCODONE HCL 20 MG PO TABS: 1.0000 | ORAL_TABLET | ORAL | 0 refills | Status: DC | PRN

## 2019-02-16 MED ORDER — METHYLPHENIDATE HCL 20 MG PO TABS: 20.0000 mg | ORAL_TABLET | Freq: Two times a day (BID) | ORAL | 0 refills | Status: DC

## 2019-02-16 MED ORDER — HYDROCODONE-ACETAMINOPHEN 10-325 MG PO TABS: 1.0000 | ORAL_TABLET | Freq: Four times a day (QID) | ORAL | 0 refills | Status: AC | PRN

## 2019-02-16 MED ORDER — OXYCODONE HCL 20 MG PO TABS: 1.0000 | ORAL_TABLET | ORAL | 0 refills | Status: AC | PRN

## 2019-02-16 NOTE — Progress Notes (Signed)
Virtual Visit via Telephone Note  I connected with the patient on 02/16/19 at 11:30 AM EST by telephone and verified that I am speaking with the correct person using two identifiers.   I discussed the limitations, risks, security and privacy concerns of performing an evaluation and management service by telephone and the availability of in person appointments. I also discussed with the patient that there may be a patient responsible charge related to this service. The patient expressed understanding and agreed to proceed.  Location patient: home Location provider: work or home office Participants present for the call: patient, provider Patient did not have a visit in the prior 7 days to address this/these issue(s).   History of Present Illness: Here for pain management, she is doing well.  Indication for chronic opioid: myofascial pain Medication and dose: Norco 10-325 and Oxycodone 20 mg # pills per month: 120 and 180 Last UDS date: 02-08-18 Opioid Treatment Agreement signed (Y/N): 04-06-17 Opioid Treatment Agreement last reviewed with patient:  02-16-19 [facility] reviewed this encounter (include red flags): Yes    Observations/Objective: Patient sounds cheerful and well on the phone. I do not appreciate any SOB. Speech and thought processing are grossly intact. Patient reported vitals:  Assessment and Plan: Pain management, meds were refilled.  Alysia Penna, MD   Follow Up Instructions:     [phone] 5-10 [phone] 11-20 [id number] 21-30 I did not refer this patient for an OV in the next 24 hours for this/these issue(s).  I discussed the assessment and treatment plan with the patient. The patient was provided an opportunity to ask questions and all were answered. The patient agreed with the plan and demonstrated an understanding of the instructions.   The patient was advised to call back or seek an in-person evaluation if the symptoms worsen or if the condition fails to improve as  anticipated.  I provided 14 minutes of non-face-to-face time during this encounter.   Alysia Penna, MD

## 2019-02-27 ENCOUNTER — Ambulatory Visit: Payer: Medicare Other | Attending: Internal Medicine

## 2019-02-27 DIAGNOSIS — Z23 Encounter for immunization: Secondary | ICD-10-CM | POA: Insufficient documentation

## 2019-02-27 NOTE — Progress Notes (Signed)
   Covid-19 Vaccination Clinic  Name:  Linda Kent    MRN: [place]:[id number] DOB: 1945/12/23  02/27/2019  [name] was observed post Covid-19 immunization for 15 minutes without incidence. She was provided with Vaccine Information Sheet and instruction to access the V-Safe system.   [name] was instructed to call 911 with any severe reactions post vaccine: Marland Kitchen Difficulty breathing  . Swelling of your face and throat  . A fast heartbeat  . A bad rash all over your body  . Dizziness and weakness    Immunizations Administered    Name Date Dose VIS Date Route   Pfizer COVID-19 Vaccine 02/27/2019  9:30 AM 0.3 mL 12/29/2018 Intramuscular   Manufacturer: [place]   Lot: [id number]   [place]: [id number]

## 2019-04-26 DIAGNOSIS — G43719 Chronic migraine without aura, intractable, without status migrainosus: Secondary | ICD-10-CM | POA: Diagnosis not present

## 2019-04-26 DIAGNOSIS — G43019 Migraine without aura, intractable, without status migrainosus: Secondary | ICD-10-CM | POA: Diagnosis not present

## 2019-05-16 ENCOUNTER — Other Ambulatory Visit: Payer: Self-pay

## 2019-05-17 ENCOUNTER — Ambulatory Visit (INDEPENDENT_AMBULATORY_CARE_PROVIDER_SITE_OTHER): Payer: Medicare Other | Admitting: Family Medicine

## 2019-05-17 ENCOUNTER — Encounter: Payer: Self-pay | Admitting: Family Medicine

## 2019-05-17 VITALS — BP 124/64 | HR 115 | Temp 97.8°F | Wt 148.2 lb

## 2019-05-17 DIAGNOSIS — F1190 Opioid use, unspecified, uncomplicated: Secondary | ICD-10-CM

## 2019-05-17 DIAGNOSIS — M7918 Myalgia, other site: Secondary | ICD-10-CM

## 2019-05-17 MED ORDER — HYDROCODONE-ACETAMINOPHEN 10-325 MG PO TABS: 1.0000 | ORAL_TABLET | Freq: Four times a day (QID) | ORAL | 0 refills | Status: DC | PRN

## 2019-05-17 MED ORDER — OXYCODONE HCL 20 MG PO TABS: 1.0000 | ORAL_TABLET | ORAL | 0 refills | Status: AC | PRN

## 2019-05-17 MED ORDER — HYDROCODONE-ACETAMINOPHEN 10-325 MG PO TABS: 1.0000 | ORAL_TABLET | Freq: Four times a day (QID) | ORAL | 0 refills | Status: AC | PRN

## 2019-05-17 MED ORDER — MECLIZINE HCL 25 MG PO TABS: 25.0000 mg | ORAL_TABLET | ORAL | 3 refills | Status: DC | PRN

## 2019-05-17 MED ORDER — OXYCODONE HCL 20 MG PO TABS: 1.0000 | ORAL_TABLET | ORAL | 0 refills | Status: DC | PRN

## 2019-05-17 MED ORDER — ZALEPLON 10 MG PO CAPS: 10.0000 mg | ORAL_CAPSULE | Freq: Every evening | ORAL | 1 refills | Status: DC | PRN

## 2019-05-17 MED ORDER — HALOBETASOL PROPIONATE 0.05 % EX CREA: TOPICAL_CREAM | Freq: Two times a day (BID) | CUTANEOUS | 5 refills | Status: DC

## 2019-05-17 NOTE — Progress Notes (Signed)
   Subjective:    Patient ID: Linda Kent, female    DOB: August 30, 1945, 74 y.o.   MRN: [place]:[id number]  HPI Here for pain management. She is doing well.  Indication for chronic opioid: myofascial pain  Medication and dose: Norco 10-325 and Oxycodone 20 mg  # pills per month: 120 and 180 Last UDS date: 05-17-19 Opioid Treatment Agreement signed (Y/N): 04-06-17 Opioid Treatment Agreement last reviewed with patient:  05-17-19 [facility] reviewed this encounter (include red flags): Yes    Review of Systems     Objective:   Physical Exam        Assessment & Plan:  Pain management, meds were refilled.  Alysia Penna, MD

## 2019-05-20 LAB — DRUG MONITOR 8 WITH CONFIRM
6 Acetylmorphine: NEGATIVE ng/mL
Alcohol Metabolites: NEGATIVE ng/mL (ref <500)
Alphahydroxyalprazolam: NEGATIVE ng/mL
Alphahydroxymidazolam: NEGATIVE ng/mL
Alphahydroxytriazolam: NEGATIVE ng/mL
Aminoclonazepam: NEGATIVE ng/mL
Amphetamines: NEGATIVE ng/mL
Benzodiazepines: POSITIVE ng/mL
Buprenorphine, Urine: NEGATIVE ng/mL
Cocaine Metabolite: NEGATIVE ng/mL
Codeine: NEGATIVE ng/mL
Creatinine: 47.7 mg/dL
Hydrocodone: 479 ng/mL
Hydromorphone: NEGATIVE ng/mL
Hydroxyethylflurazepam: NEGATIVE ng/mL
Lorazepam: 1778 ng/mL
MDMA: NEGATIVE ng/mL
Marijuana Metabolite: 11 ng/mL
Marijuana Metabolite: POSITIVE ng/mL
Morphine: NEGATIVE ng/mL
Nordiazepam: NEGATIVE ng/mL
Norhydrocodone: 655 ng/mL
Noroxycodone: >10000 ng/mL
Opiates: POSITIVE ng/mL
Oxazepam: NEGATIVE ng/mL
Oxidant: NEGATIVE ug/mL
Oxycodone: 1915 ng/mL
Oxycodone: POSITIVE ng/mL
Oxymorphone: 1604 ng/mL
Temazepam: NEGATIVE ng/mL
pH: 7.1 (ref 4.5–9.0)

## 2019-05-23 DIAGNOSIS — M810 Age-related osteoporosis without current pathological fracture: Secondary | ICD-10-CM | POA: Diagnosis not present

## 2019-06-05 ENCOUNTER — Encounter: Payer: Medicare Other | Admitting: Family Medicine

## 2019-06-25 ENCOUNTER — Encounter: Payer: Medicare Other | Admitting: Family Medicine

## 2019-07-25 ENCOUNTER — Other Ambulatory Visit: Payer: Self-pay

## 2019-07-25 ENCOUNTER — Other Ambulatory Visit (INDEPENDENT_AMBULATORY_CARE_PROVIDER_SITE_OTHER): Payer: Medicare Other

## 2019-07-25 ENCOUNTER — Ambulatory Visit (INDEPENDENT_AMBULATORY_CARE_PROVIDER_SITE_OTHER): Payer: Medicare Other | Admitting: Family Medicine

## 2019-07-25 ENCOUNTER — Encounter: Payer: Self-pay | Admitting: Family Medicine

## 2019-07-25 VITALS — BP 106/64 | HR 90 | Temp 97.6°F | Ht 64.5 in | Wt 147.0 lb

## 2019-07-25 DIAGNOSIS — G43909 Migraine, unspecified, not intractable, without status migrainosus: Secondary | ICD-10-CM

## 2019-07-25 DIAGNOSIS — E782 Mixed hyperlipidemia: Secondary | ICD-10-CM

## 2019-07-25 DIAGNOSIS — M7918 Myalgia, other site: Secondary | ICD-10-CM

## 2019-07-25 DIAGNOSIS — M810 Age-related osteoporosis without current pathological fracture: Secondary | ICD-10-CM | POA: Diagnosis not present

## 2019-07-25 DIAGNOSIS — G4700 Insomnia, unspecified: Secondary | ICD-10-CM

## 2019-07-25 DIAGNOSIS — F411 Generalized anxiety disorder: Secondary | ICD-10-CM

## 2019-07-25 DIAGNOSIS — E039 Hypothyroidism, unspecified: Secondary | ICD-10-CM

## 2019-07-25 DIAGNOSIS — F909 Attention-deficit hyperactivity disorder, unspecified type: Secondary | ICD-10-CM

## 2019-07-25 LAB — CBC WITH DIFFERENTIAL/PLATELET
Basophils Absolute: 0 10*3/uL (ref 0.0–0.1)
Basophils Relative: 0.5 % (ref 0.0–3.0)
Eosinophils Absolute: 0.3 10*3/uL (ref 0.0–0.7)
Eosinophils Relative: 2.7 % (ref 0.0–5.0)
HCT: 42.1 % (ref 36.0–46.0)
Hemoglobin: 14.2 g/dL (ref 12.0–15.0)
Lymphocytes Relative: 30 % (ref 12.0–46.0)
Lymphs Abs: 2.8 10*3/uL (ref 0.7–4.0)
MCHC: 33.6 g/dL (ref 30.0–36.0)
MCV: 96.6 fl (ref 78.0–100.0)
Monocytes Absolute: 0.6 10*3/uL (ref 0.1–1.0)
Monocytes Relative: 6.7 % (ref 3.0–12.0)
Neutro Abs: 5.6 10*3/uL (ref 1.4–7.7)
Neutrophils Relative %: 60.1 % (ref 43.0–77.0)
Platelets: 301 10*3/uL (ref 150.0–400.0)
RBC: 4.36 Mil/uL (ref 3.87–5.11)
RDW: 13.2 % (ref 11.5–15.5)
WBC: 9.4 10*3/uL (ref 4.0–10.5)

## 2019-07-25 LAB — BASIC METABOLIC PANEL
BUN: 13 mg/dL (ref 6–23)
CO2: 26 mEq/L (ref 19–32)
Calcium: 9.7 mg/dL (ref 8.4–10.5)
Chloride: 103 mEq/L (ref 96–112)
Creatinine, Ser: 0.89 mg/dL (ref 0.40–1.20)
GFR: 61.68 mL/min (ref >60.00)
Glucose, Bld: 97 mg/dL (ref 70–99)
Potassium: 3.8 mEq/L (ref 3.5–5.1)
Sodium: 139 mEq/L (ref 135–145)

## 2019-07-25 LAB — HEPATIC FUNCTION PANEL
ALT: 14 U/L (ref 0–35)
AST: 20 U/L (ref 0–37)
Albumin: 4.2 g/dL (ref 3.5–5.2)
Alkaline Phosphatase: 77 U/L (ref 39–117)
Bilirubin, Direct: 0.1 mg/dL (ref 0.0–0.3)
Total Bilirubin: 0.4 mg/dL (ref 0.2–1.2)
Total Protein: 7 g/dL (ref 6.0–8.3)

## 2019-07-25 LAB — LIPID PANEL
Cholesterol: 233 mg/dL — ABNORMAL HIGH (ref 0–200)
HDL: 54.3 mg/dL (ref >39.00)
NonHDL: 179.04
Total CHOL/HDL Ratio: 4
Triglycerides: 212 mg/dL — ABNORMAL HIGH (ref 0.0–149.0)
VLDL: 42.4 mg/dL — ABNORMAL HIGH (ref 0.0–40.0)

## 2019-07-25 LAB — LDL CHOLESTEROL, DIRECT: Direct LDL: 145 mg/dL

## 2019-07-25 MED ORDER — TOPIRAMATE 50 MG PO TABS: 150.0000 mg | ORAL_TABLET | Freq: Every day | ORAL | 3 refills | Status: DC

## 2019-07-25 MED ORDER — RIZATRIPTAN BENZOATE 10 MG PO TBDP: 10.0000 mg | ORAL_TABLET | ORAL | 3 refills | Status: DC | PRN

## 2019-07-25 MED ORDER — ZALEPLON 10 MG PO CAPS: 10.0000 mg | ORAL_CAPSULE | Freq: Every evening | ORAL | 1 refills | Status: DC | PRN

## 2019-07-25 MED ORDER — METRONIDAZOLE 0.75 % EX CREA: 1.0000 "application " | TOPICAL_CREAM | Freq: Every day | CUTANEOUS | 3 refills | Status: DC

## 2019-07-25 MED ORDER — MECLIZINE HCL 25 MG PO TABS: 25.0000 mg | ORAL_TABLET | ORAL | 3 refills | Status: DC | PRN

## 2019-07-25 MED ORDER — NONFORMULARY OR COMPOUNDED ITEM: 6 refills | Status: DC

## 2019-07-25 NOTE — Progress Notes (Signed)
Subjective:    Patient ID: Linda Kent, female    DOB: 03/14/1945, 74 y.o.   MRN: [id number]  HPI Here to follow up on issues. She is doing well in general, and her only real complaint is her chronic pain. We have [place] visits with her every 3 months. Her migraines have been well controlled. Her rosacea has flared up so she asks for refills on Metronidazole cream. She sees Dr. Posey Pronto to manage her hypothyroidism. Her anxiety is stable and she sleeps well with [place]. Her vertigo flares up from time to time, and she manages this with meclizine.    Review of Systems  Constitutional: Negative.   HENT: Negative.   Eyes: Negative.   Respiratory: Negative.   Cardiovascular: Negative.   Gastrointestinal: Negative.   Genitourinary: Negative for decreased urine volume, difficulty urinating, dyspareunia, dysuria, enuresis, flank pain, frequency, hematuria, pelvic pain and urgency.  Musculoskeletal: Positive for myalgias.  Skin: Negative.   Neurological: Negative.   Psychiatric/Behavioral: Negative.        Objective:   Physical Exam Constitutional:      General: She is not in acute distress.    Appearance: She is well-developed.     Comments: She wears a lumbar support brace   HENT:     Head: Normocephalic and atraumatic.     Right Ear: External ear normal.     Left Ear: External ear normal.     Nose: Nose normal.     Mouth/Throat:     Pharynx: No oropharyngeal exudate.  Eyes:     General: No scleral icterus.    Conjunctiva/sclera: Conjunctivae normal.     Pupils: Pupils are equal, round, and reactive to light.  Neck:     Thyroid: No thyromegaly.     Vascular: No JVD.  Cardiovascular:     Rate and Rhythm: Normal rate and regular rhythm.     Heart sounds: Normal heart sounds. No murmur heard.  No friction rub. No gallop.   Pulmonary:     Effort: Pulmonary effort is normal. No respiratory distress.     Breath sounds: Normal breath sounds. No wheezing or rales.  Chest:      Chest wall: No tenderness.  Abdominal:     General: Bowel sounds are normal. There is no distension.     Palpations: Abdomen is soft. There is no mass.     Tenderness: There is no abdominal tenderness. There is no guarding or rebound.  Musculoskeletal:        General: No tenderness. Normal range of motion.     Cervical back: Normal range of motion and neck supple.  Lymphadenopathy:     Cervical: No cervical adenopathy.  Skin:    General: Skin is warm and dry.     Findings: No erythema or rash.  Neurological:     Mental Status: She is alert and oriented to person, place, and time.     Cranial Nerves: No cranial nerve deficit.     Motor: No abnormal muscle tone.     Coordination: Coordination normal.     Deep Tendon Reflexes: Reflexes are normal and symmetric. Reflexes normal.  Psychiatric:        Behavior: Behavior normal.        Thought Content: Thought content normal.        Judgment: Judgment normal.           Assessment & Plan:  Her chronic issues as above are stable. We will have another PMV on  08-20-19. She is fasting so we will check lipids, etc today. She is past due for a bone density test and she says she will wet this up soon. Alysia Penna, MD

## 2019-08-01 ENCOUNTER — Telehealth: Payer: Self-pay | Admitting: *Deleted

## 2019-08-01 NOTE — Telephone Encounter (Signed)
ATC, held for 10 minutes and no one ever answered the phone. Will try back.

## 2019-08-01 NOTE — Telephone Encounter (Signed)
Lanae Boast from [facility] called nurse triage to in regards to verifying prescription for patient. Lanae Boast can be reached at [phone] ext [phone]

## 2019-08-02 ENCOUNTER — Telehealth: Payer: Self-pay | Admitting: Family Medicine

## 2019-08-02 NOTE — Telephone Encounter (Signed)
Linda Kent with [facility] is calling in to see if pts Rx Maxalt can be changed to regular maxalt 10 MG.  If no one answers you can leave a detail msg on the voicemail.

## 2019-08-03 NOTE — Telephone Encounter (Signed)
Yes please make this change

## 2019-08-03 NOTE — Telephone Encounter (Signed)
Left a detailed message on verified voice mail. Letting the pharmacy know that it was ok to change this medication. Nothing further needed.

## 2019-08-03 NOTE — Telephone Encounter (Signed)
ATC, was placed on hold for 15 minutes and no one ever came back to the phone. Will try again.

## 2019-08-06 NOTE — Telephone Encounter (Signed)
Fax was received from the pharmacy. All information was provided and faxed back. Nothing further needed.

## 2019-08-07 ENCOUNTER — Telehealth: Payer: Self-pay | Admitting: Family Medicine

## 2019-08-07 DIAGNOSIS — M8589 Other specified disorders of bone density and structure, multiple sites: Secondary | ICD-10-CM | POA: Diagnosis not present

## 2019-08-07 DIAGNOSIS — M810 Age-related osteoporosis without current pathological fracture: Secondary | ICD-10-CM | POA: Diagnosis not present

## 2019-08-07 DIAGNOSIS — Z1231 Encounter for screening mammogram for malignant neoplasm of breast: Secondary | ICD-10-CM | POA: Diagnosis not present

## 2019-08-07 NOTE — Telephone Encounter (Signed)
Call back number for [facility] is incorrect. Will reach out to the patient to see what is needed.

## 2019-08-07 NOTE — Telephone Encounter (Signed)
Please get more information on what [facility] needs

## 2019-08-07 NOTE — Telephone Encounter (Signed)
Ulice Dash from [facility] call a need a new order for the unsound of the pain.

## 2019-08-08 NOTE — Telephone Encounter (Signed)
Received a fax for orders from [facility]. Order form was completed and faxed back. Nothing further needed.

## 2019-08-13 ENCOUNTER — Telehealth: Payer: Self-pay | Admitting: Family Medicine

## 2019-08-13 NOTE — Telephone Encounter (Signed)
Spoke with patient spouse he stated to call her back today after 12noon

## 2019-08-14 ENCOUNTER — Telehealth: Payer: Self-pay | Admitting: Family Medicine

## 2019-08-15 NOTE — Telephone Encounter (Signed)
error 

## 2019-08-17 ENCOUNTER — Ambulatory Visit: Payer: Medicare Other | Admitting: Family Medicine

## 2019-08-20 ENCOUNTER — Ambulatory Visit: Payer: Medicare Other

## 2019-08-20 ENCOUNTER — Ambulatory Visit: Payer: Medicare Other | Admitting: Family Medicine

## 2019-09-14 ENCOUNTER — Other Ambulatory Visit: Payer: Self-pay

## 2019-09-17 ENCOUNTER — Encounter: Payer: Self-pay | Admitting: Family Medicine

## 2019-09-17 ENCOUNTER — Ambulatory Visit (INDEPENDENT_AMBULATORY_CARE_PROVIDER_SITE_OTHER): Payer: Medicare Other | Admitting: Family Medicine

## 2019-09-17 ENCOUNTER — Other Ambulatory Visit: Payer: Self-pay

## 2019-09-17 VITALS — BP 134/60 | HR 126 | Temp 98.7°F | Wt 148.2 lb

## 2019-09-17 DIAGNOSIS — F1190 Opioid use, unspecified, uncomplicated: Secondary | ICD-10-CM

## 2019-09-17 DIAGNOSIS — M7918 Myalgia, other site: Secondary | ICD-10-CM

## 2019-09-17 MED ORDER — HYDROCODONE-ACETAMINOPHEN 10-325 MG PO TABS: 1.0000 | ORAL_TABLET | Freq: Four times a day (QID) | ORAL | 0 refills | Status: DC | PRN

## 2019-09-17 MED ORDER — OXYCODONE HCL 20 MG PO TABS: 20.0000 mg | ORAL_TABLET | ORAL | 0 refills | Status: DC | PRN

## 2019-09-17 MED ORDER — METHYLPHENIDATE HCL 20 MG PO TABS: 20.0000 mg | ORAL_TABLET | Freq: Two times a day (BID) | ORAL | 0 refills | Status: DC

## 2019-09-17 MED ORDER — LORAZEPAM 2 MG PO TABS: 2.0000 mg | ORAL_TABLET | Freq: Three times a day (TID) | ORAL | 5 refills | Status: DC

## 2019-09-17 MED ORDER — OXYCODONE HCL 20 MG PO TABS
20.0000 mg | ORAL_TABLET | ORAL | 0 refills | Status: DC | PRN
Start: 2019-10-18 — End: 2019-09-17

## 2019-09-17 MED ORDER — HYDROCODONE-ACETAMINOPHEN 10-325 MG PO TABS: 1.0000 | ORAL_TABLET | Freq: Four times a day (QID) | ORAL | 0 refills | Status: AC | PRN

## 2019-09-17 NOTE — Progress Notes (Signed)
   Subjective:    Patient ID: Linda Kent, female    DOB: 06/19/1945, 74 y.o.   MRN: [id number]  HPI Here for pain management. She is doing well.  Indication for chronic opioid: myofascial pain Medication and dose: Norco 10-325 and Oxycodone 20 mg  # pills per month: 120 and 180 Last UDS date: 05-17-19 Opioid Treatment Agreement signed (Y/N): 04-06-17 Opioid Treatment Agreement last reviewed with patient:  09-17-19 [place] reviewed this encounter (include red flags): Yes   Review of Systems     Objective:   Physical Exam        Assessment & Plan:  Pain management, meds were refilled.  Alysia Penna, MD

## 2019-10-10 ENCOUNTER — Other Ambulatory Visit: Payer: Self-pay

## 2019-10-10 ENCOUNTER — Ambulatory Visit (INDEPENDENT_AMBULATORY_CARE_PROVIDER_SITE_OTHER): Payer: Medicare Other

## 2019-10-10 DIAGNOSIS — Z0000 Encounter for general adult medical examination without abnormal findings: Secondary | ICD-10-CM | POA: Diagnosis not present

## 2019-10-10 NOTE — Patient Instructions (Signed)
[name] , Thank you for taking time to come for your Medicare Wellness Visit. I appreciate your ongoing commitment to your health goals. Please review the following plan we discussed and let me know if I can assist you in the future.   Screening recommendations/referrals: Colonoscopy: No longer required Mammogram: Up to date, next due 08/06/2020 Bone Density: Currently due, orders placed this visit Recommended yearly ophthalmology/optometry visit for glaucoma screening and checkup Recommended yearly dental visit for hygiene and checkup  Vaccinations: Influenza vaccine: Currently due, you may receive in our office or at your local pharmacy  Pneumococcal vaccine: Completed series Tdap vaccine: Currently due, you may contact your insurance to discuss cost or await and injury to receive Shingles vaccine: currently due, please contact your pharmacy to discuss cost and to receive vaccine    Advanced directives: Please bring a copy of your Medical Advanced Directives into the office so that we may scan it into your chart.  Conditions/risks identified: none   Next appointment: none    Preventive Care 65 Years and Older, Female Preventive care refers to lifestyle choices and visits with your health care provider that can promote health and wellness. What does preventive care include?  A yearly physical exam. This is also called an annual well check.  Dental exams once or twice a year.  Routine eye exams. Ask your health care provider how often you should have your eyes checked.  Personal lifestyle choices, including:  Daily care of your teeth and gums.  Regular physical activity.  Eating a healthy diet.  Avoiding tobacco and drug use.  Limiting alcohol use.  Practicing safe sex.  Taking low-dose aspirin every day.  Taking vitamin and mineral supplements as recommended by your health care provider. What happens during an annual well check? The services and screenings done  by your health care provider during your annual well check will depend on your age, overall health, lifestyle risk factors, and family history of disease. Counseling  Your health care provider may ask you questions about your:  Alcohol use.  Tobacco use.  Drug use.  Emotional well-being.  Home and relationship well-being.  Sexual activity.  Eating habits.  History of falls.  Memory and ability to understand (cognition).  Work and work Statistician.  Reproductive health. Screening  You may have the following tests or measurements:  Height, weight, and BMI.  Blood pressure.  Lipid and cholesterol levels. These may be checked every 5 years, or more frequently if you are over 14 years old.  Skin check.  Lung cancer screening. You may have this screening every year starting at age 42 if you have a 30-pack-year history of smoking and currently smoke or have quit within the past 15 years.  Fecal occult blood test (FOBT) of the stool. You may have this test every year starting at age 59.  Flexible sigmoidoscopy or colonoscopy. You may have a sigmoidoscopy every 5 years or a colonoscopy every 10 years starting at age 19.  Hepatitis C blood test.  Hepatitis B blood test.  Sexually transmitted disease (STD) testing.  Diabetes screening. This is done by checking your blood sugar (glucose) after you have not eaten for a while (fasting). You may have this done every 1-3 years.  Bone density scan. This is done to screen for osteoporosis. You may have this done starting at age 35.  Mammogram. This may be done every 1-2 years. Talk to your health care provider about how often you should have regular mammograms.  Talk with your health care provider about your test results, treatment options, and if necessary, the need for more tests. Vaccines  Your health care provider may recommend certain vaccines, such as:  Influenza vaccine. This is recommended every year.  Tetanus,  diphtheria, and acellular pertussis (Tdap, Td) vaccine. You may need a Td booster every 10 years.  Zoster vaccine. You may need this after age 66.  Pneumococcal 13-valent conjugate (PCV13) vaccine. One dose is recommended after age 35.  Pneumococcal polysaccharide (PPSV23) vaccine. One dose is recommended after age 7. Talk to your health care provider about which screenings and vaccines you need and how often you need them. This information is not intended to replace advice given to you by your health care provider. Make sure you discuss any questions you have with your health care provider. Document Released: 01/31/2015 Document Revised: 09/24/2015 Document Reviewed: 11/05/2014 Elsevier Interactive Patient Education  2017 [place] Prevention in the Home Falls can cause injuries. They can happen to people of all ages. There are many things you can do to make your home safe and to help prevent falls. What can I do on the outside of my home?  Regularly fix the edges of walkways and driveways and fix any cracks.  Remove anything that might make you trip as you walk through a door, such as a raised step or threshold.  Trim any bushes or trees on the path to your home.  Use bright outdoor lighting.  Clear any walking paths of anything that might make someone trip, such as rocks or tools.  Regularly check to see if handrails are loose or broken. Make sure that both sides of any steps have handrails.  Any raised decks and porches should have guardrails on the edges.  Have any leaves, snow, or ice cleared regularly.  Use sand or salt on walking paths during winter.  Clean up any spills in your garage right away. This includes oil or grease spills. What can I do in the bathroom?  Use night lights.  Install grab bars by the toilet and in the tub and shower. Do not use towel bars as grab bars.  Use non-skid mats or decals in the tub or shower.  If you need to sit down in  the shower, use a plastic, non-slip stool.  Keep the floor dry. Clean up any water that spills on the floor as soon as it happens.  Remove soap buildup in the tub or shower regularly.  Attach bath mats securely with double-sided non-slip rug tape.  Do not have throw rugs and other things on the floor that can make you trip. What can I do in the bedroom?  Use night lights.  Make sure that you have a light by your bed that is easy to reach.  Do not use any sheets or blankets that are too big for your bed. They should not hang down onto the floor.  Have a firm chair that has side arms. You can use this for support while you get dressed.  Do not have throw rugs and other things on the floor that can make you trip. What can I do in the kitchen?  Clean up any spills right away.  Avoid walking on wet floors.  Keep items that you use a lot in easy-to-reach places.  If you need to reach something above you, use a strong step stool that has a grab bar.  Keep electrical cords out of the way.  Do not use floor polish or wax that makes floors slippery. If you must use wax, use non-skid floor wax.  Do not have throw rugs and other things on the floor that can make you trip. What can I do with my stairs?  Do not leave any items on the stairs.  Make sure that there are handrails on both sides of the stairs and use them. Fix handrails that are broken or loose. Make sure that handrails are as long as the stairways.  Check any carpeting to make sure that it is firmly attached to the stairs. Fix any carpet that is loose or worn.  Avoid having throw rugs at the top or bottom of the stairs. If you do have throw rugs, attach them to the floor with carpet tape.  Make sure that you have a light switch at the top of the stairs and the bottom of the stairs. If you do not have them, ask someone to add them for you. What else can I do to help prevent falls?  Wear shoes that:  Do not have high  heels.  Have rubber bottoms.  Are comfortable and fit you well.  Are closed at the toe. Do not wear sandals.  If you use a stepladder:  Make sure that it is fully opened. Do not climb a closed stepladder.  Make sure that both sides of the stepladder are locked into place.  Ask someone to hold it for you, if possible.  Clearly mark and make sure that you can see:  Any grab bars or handrails.  First and last steps.  Where the edge of each step is.  Use tools that help you move around (mobility aids) if they are needed. These include:  Canes.  Walkers.  Scooters.  Crutches.  Turn on the lights when you go into a dark area. Replace any light bulbs as soon as they burn out.  Set up your furniture so you have a clear path. Avoid moving your furniture around.  If any of your floors are uneven, fix them.  If there are any pets around you, be aware of where they are.  Review your medicines with your doctor. Some medicines can make you feel dizzy. This can increase your chance of falling. Ask your doctor what other things that you can do to help prevent falls. This information is not intended to replace advice given to you by your health care provider. Make sure you discuss any questions you have with your health care provider. Document Released: 10/31/2008 Document Revised: 06/12/2015 Document Reviewed: 02/08/2014 Elsevier Interactive Patient Education  2017 [employer].

## 2019-10-10 NOTE — Progress Notes (Addendum)
Subjective:   Linda Kent is a 74 y.o. female who presents for Medicare Annual (Subsequent) preventive examination.  I connected with Linda Kent  today by telephone and verified that I am speaking with the correct person using two identifiers. Location patient: home Location provider: work Persons participating in the virtual visit: patient, provider.   I discussed the limitations, risks, security and privacy concerns of performing an evaluation and management service by telephone and the availability of in person appointments. I also discussed with the patient that there may be a patient responsible charge related to this service. The patient expressed understanding and verbally consented to this telephonic visit.    Interactive audio and video telecommunications were attempted between this provider and patient, however failed, due to patient having technical difficulties OR patient did not have access to video capability.  We continued and completed visit with audio only.     Review of Systems    N/A Cardiac Risk Factors include: advanced age (>7men, >17 women)     Objective:    Today's Vitals   10/10/19 1329  PainSc: 1    There is no height or weight on file to calculate BMI.  Advanced Directives 10/10/2019  Does Patient Have a Medical Advance Directive? Yes  Type of Paramedic of [place];Living will  Does patient want to make changes to medical advance directive? No - Patient declined  Copy of [place] in Chart? No - copy requested    Current Medications (verified) Outpatient Encounter Medications as of 10/10/2019  Medication Sig  . Ascorbic Acid (VITAMIN C) 1000 MG tablet Take 1,000 mg by mouth 2 (two) times daily.   Marland Kitchen aspirin 81 MG EC tablet Take 81 mg by mouth daily.    . Calcium 1500 MG tablet Take 1,500 mg by mouth daily. With Vitamin D  . denosumab (PROLIA) 60 MG/ML SOLN injection Inject 60 mg into the skin every  6 (six) months. Administer in upper arm, thigh, or abdomen  . halobetasol (ULTRAVATE) 0.05 % cream Apply topically 2 (two) times daily.  Derrill Memo ON 11/17/2019] HYDROcodone-acetaminophen (NORCO) 10-325 MG tablet Take 1 tablet by mouth every 6 (six) hours as needed for moderate pain.  Marland Kitchen levothyroxine (SYNTHROID, LEVOTHROID) 100 MCG tablet Take 100 mcg by mouth daily. Brand name only   . LORazepam (ATIVAN) 2 MG tablet Take 1 tablet (2 mg total) by mouth 3 (three) times daily.  . meclizine (ANTIVERT) 25 MG tablet Take 1 tablet (25 mg total) by mouth every 4 (four) hours as needed for dizziness.  . Melatonin 3 MG CAPS Take 9 mg by mouth at bedtime.   Derrill Memo ON 11/17/2019] methylphenidate (RITALIN) 20 MG tablet Take 1 tablet (20 mg total) by mouth 2 (two) times daily.  . metroNIDAZOLE (METROCREAM) 0.75 % cream Apply 1 application topically daily.  . Multiple Vitamin (MULTIVITAMIN) tablet Take 1 tablet by mouth daily. Centrum 50 +  . NON FORMULARY   . NONFORMULARY OR COMPOUNDED ITEM APPLY A DIME SIZED AMOUNT TO AFFECTED AREA 4 TIMES DAILY AS NEEDED.  Derrill Memo ON 11/17/2019] Oxycodone HCl 20 MG TABS Take 1 tablet (20 mg total) by mouth every 4 (four) hours as needed (pain).  Marland Kitchen perphenazine 4 MG tablet Take 4 mg by mouth every 4 (four) hours as needed. For migraines  . Probiotic Product (ALIGN PO) Take 1 capsule by mouth daily.   . rizatriptan (MAXALT-MLT) 10 MG disintegrating tablet Take 1 tablet (10 mg  total) by mouth as needed. May repeat in 2 hours if needed. Maximum 4 per week dr. Domingo Cocking  . topiramate (TOPAMAX) 50 MG tablet Take 3 tablets (150 mg total) by mouth daily.  . zaleplon (SONATA) 10 MG capsule Take 1 capsule (10 mg total) by mouth at bedtime as needed. for sleep  . [DISCONTINUED] fluconazole (DIFLUCAN) 150 MG tablet Take 1 tablet (150 mg total) by mouth daily.   No facility-administered encounter medications on file as of 10/10/2019.    Allergies (verified) Ezetimibe, Atorvastatin  calcium [atorvastatin], Crestor [rosuvastatin calcium], and Cephalexin   History: Past Medical History:  Diagnosis Date  . Acne rosacea   . ADD (attention deficit disorder)   . Back pain   . Chronic insomnia   . Colon polyps   . Depression   . Hyperlipidemia   . Hypothyroidism    sees Dr. Elmarie Shiley   . IBS (irritable bowel syndrome)   . Migraines    sees Dr. Orie Rout   . Myofascial pain syndrome    sees Dr. Elta Guadeloupe Philips   . Osteoporosis    sees Dr. Elmarie Shiley   . Post menopausal syndrome   . Recurrent cystitis    after intercourse, controlled with macrobid   . Routine gynecological examination    sees [facility]    Past Surgical History:  Procedure Laterality Date  . ABDOMINAL HYSTERECTOMY     [facility] for prolapse  . APPENDECTOMY    . COLONOSCOPY  2017   per Dr. Amedeo Plenty, adenomatous polyps, repeat in 5 yrs   . OVARIAN CYST REMOVAL     excised on left   . TONSILLECTOMY     Family History  Problem Relation Age of Onset  . Other Brother        low serotonin levels   . Other Sister        low serotonin levels  . Colon polyps Sister   . Cancer Other 70       puncle decreased from colon cancer    Social History   Socioeconomic History  . Marital status: Married    Spouse name: Not on file  . Number of children: Not on file  . Years of education: Not on file  . Highest education level: Not on file  Occupational History  . Not on file  Tobacco Use  . Smoking status: Never Smoker  . Smokeless tobacco: Never Used  Substance and Sexual Activity  . Alcohol use: No    Alcohol/week: 0.0 standard drinks  . Drug use: No  . Sexual activity: Yes  Other Topics Concern  . Not on file  Social History Narrative  . Not on file   Social Determinants of Health   Financial Resource Strain: Low Risk   . Difficulty of Paying Living Expenses: Not hard at all  Food Insecurity: No Food Insecurity  . Worried About Charity fundraiser in the Last Year: Never true    . Ran Out of Food in the Last Year: Never true  Transportation Needs: No Transportation Needs  . Lack of Transportation (Medical): No  . Lack of Transportation (Non-Medical): No  Physical Activity: Inactive  . Days of Exercise per Week: 0 days  . Minutes of Exercise per Session: 0 min  Stress: No Stress Concern Present  . Feeling of Stress : Not at all  Social Connections: Socially Isolated  . Frequency of Communication with Friends and Family: Once a week  . Frequency of Social Gatherings  with Friends and Family: Once a week  . Attends Religious Services: Never  . Active Member of Clubs or Organizations: No  . Attends Archivist Meetings: Never  . Marital Status: Married    Tobacco Counseling Counseling given: Not Answered   Clinical Intake:  Pre-visit preparation completed: Yes  Pain : 0-10 Pain Score: 1  Pain Type: Chronic pain Pain Location: Back Pain Descriptors / Indicators: Other (Comment) (Piercing) Pain Onset: More than a month ago Pain Frequency: Constant Pain Relieving Factors: Heating pad, resting, oxycodone  Pain Relieving Factors: Heating pad, resting, oxycodone  Nutritional Risks: Nausea/ vomitting/ diarrhea (Nausea) Diabetes: No  How often do you need to have someone help you when you read instructions, pamphlets, or other written materials from your doctor or pharmacy?: 1 - Never What is the last grade level you completed in school?: High School  Diabetic?No  Interpreter Needed?: No  Information entered by :: [place] of Daily Living In your present state of health, do you have any difficulty performing the following activities: 10/10/2019  Hearing? N  Vision? Y  Comment Has issues with vision being a little fuzzy  Difficulty concentrating or making decisions? N  Walking or climbing stairs? Y  Comment Has chronic back pain and is unable to climb stairs  Dressing or bathing? Y  Comment Husband gives patient sponge  baths  Doing errands, shopping? Y  Comment Patient does not Physiological scientist and eating ? N  Using the Toilet? N  In the past six months, have you accidently leaked urine? Y  Comment Has occassional bladder leakage wears pads when she needs to  Do you have problems with loss of bowel control? N  Managing your Medications? N  Managing your Finances? N  Housekeeping or managing your Housekeeping? N  Some recent data might be hidden    Patient Care Team: Laurey Morale, MD as PCP - General (Family Medicine)  Indicate any recent Medical Services you may have received from other than [facility] providers in the past year (date may be approximate).     Assessment:   This is a routine wellness examination for Linda Kent.  Hearing/Vision screen  Hearing Screening   125Hz  250Hz  500Hz  1000Hz  2000Hz  3000Hz  4000Hz  6000Hz  8000Hz   Right ear:           Left ear:           Vision Screening Comments: Patient states that she does not get eyes checked annually    Dietary issues and exercise activities discussed: Current Exercise Habits: The patient does not participate in regular exercise at present, Exercise limited by: orthopedic condition(s)  Goals    . Patient Stated     I want to live day by day the very best I can       Depression Screen PHQ 2/9 Scores 10/10/2019 06/11/2016  PHQ - 2 Score 1 0  PHQ- 9 Score 1 -    Fall Risk Fall Risk  10/10/2019 12/13/2018 06/11/2016 12/26/2015  Falls in the past year? 1 1 No Yes  Comment - Emmi Telephone Survey: data to providers prior to load - Emmi Telephone Survey: data to providers prior to load  Number falls in past yr: 0 1 - 1  Comment - Emmi Telephone Survey Actual Response = 3 - Emmi Telephone Survey Actual Response = 1  Injury with Fall? 0 1 - No  Risk for fall due to : History of fall(s);Impaired balance/gait - - -  Follow up Falls evaluation completed;Falls prevention discussed - - -    Any stairs in or around the home? No  If so, are  there any without handrails? No  Home free of loose throw rugs in walkways, pet beds, electrical cords, etc? Yes  Adequate lighting in your home to reduce risk of falls? Yes   ASSISTIVE DEVICES UTILIZED TO PREVENT FALLS:  Life alert? No  Use of a cane, walker or w/c? No  Grab bars in the bathroom? No  Shower chair or bench in shower? No  Elevated toilet seat or a handicapped toilet? No    Cognitive Function:        Immunizations Immunization History  Administered Date(s) Administered  . Influenza Split 11/05/2010, 10/04/2012  . Influenza Whole 10/24/2007, 10/29/2008  . Influenza, High Dose Seasonal PF 10/22/2013, 10/14/2017, 10/12/2018  . Influenza-Unspecified 10/02/2014, 10/15/2015  . PFIZER SARS-COV-2 Vaccination 02/06/2019, 02/27/2019  . Pneumococcal Conjugate-13 06/11/2015  . Pneumococcal Polysaccharide-23 10/24/2007  . Td 09/26/2007    TDAP status: Due, Education has been provided regarding the importance of this vaccine. Advised may receive this vaccine at local pharmacy or Health Dept. Aware to provide a copy of the vaccination record if obtained from local pharmacy or Health Dept. Verbalized acceptance and understanding. Flu Vaccine status: Up to date Pneumococcal vaccine status: Up to date Covid-19 vaccine status: Completed vaccines  Qualifies for Shingles Vaccine? Yes   Zostavax completed No   Shingrix Completed?: No.    Education has been provided regarding the importance of this vaccine. Patient has been advised to call insurance company to determine out of pocket expense if they have not yet received this vaccine. Advised may also receive vaccine at local pharmacy or Health Dept. Verbalized acceptance and understanding.  Screening Tests Health Maintenance  Topic Date Due  . Hepatitis C Screening  Never done  . PNA vac Low Risk Adult (2 of 2 - PPSV23) 06/10/2016  . TETANUS/TDAP  09/25/2017  . INFLUENZA VACCINE  08/19/2019  . DEXA SCAN  Completed  . COVID-19  Vaccine  Completed    Health Maintenance  Health Maintenance Due  Topic Date Due  . Hepatitis C Screening  Never done  . PNA vac Low Risk Adult (2 of 2 - PPSV23) 06/10/2016  . TETANUS/TDAP  09/25/2017  . INFLUENZA VACCINE  08/19/2019    Colorectal cancer screening: No longer required.  Mammogram status: Completed 08/07/2019. Repeat every year Bone Density status: Ordered 10/10/2019. Pt provided with contact info and advised to call to schedule appt.  Lung Cancer Screening: (Low Dose CT Chest recommended if Age 26-80 years, 30 pack-year currently smoking OR have quit w/in 15years.) does not qualify.   Lung Cancer Screening Referral: N/A  Additional Screening:  Hepatitis C Screening: does qualify;   Vision Screening: Recommended annual ophthalmology exams for early detection of glaucoma and other disorders of the eye. Is the patient up to date with their annual eye exam?  No  Who is the provider or what is the name of the office in which the patient attends annual eye exams? Patient states that she will contact [facility]  If pt is not established with a provider, would they like to be referred to a provider to establish care? No .   Dental Screening: Recommended annual dental exams for proper oral hygiene  Community Resource Referral / Chronic Care Management: CRR required this visit?  No   CCM required this visit?  No      Plan:  I have personally reviewed and noted the following in the patient's chart:   . Medical and social history . Use of alcohol, tobacco or illicit drugs  . Current medications and supplements . Functional ability and status . Nutritional status . Physical activity . Advanced directives . List of other physicians . Hospitalizations, surgeries, and ER visits in previous 12 months . Vitals . Screenings to include cognitive, depression, and falls . Referrals and appointments  In addition, I have reviewed and discussed with patient  certain preventive protocols, quality metrics, and best practice recommendations. A written personalized care plan for preventive services as well as general preventive health recommendations were provided to patient.     Ofilia Neas, LPN   9/50/9326   Nurse Notes: None  I have reviewed this note and agree with its contents . Alysia Penna, MD

## 2019-10-18 ENCOUNTER — Other Ambulatory Visit: Payer: Self-pay

## 2019-10-18 MED ORDER — NONFORMULARY OR COMPOUNDED ITEM
6 refills | Status: DC
Start: 2019-10-18 — End: 2020-08-13

## 2019-10-22 DIAGNOSIS — Z23 Encounter for immunization: Secondary | ICD-10-CM | POA: Diagnosis not present

## 2019-11-26 ENCOUNTER — Telehealth: Payer: Self-pay | Admitting: Family Medicine

## 2019-11-26 NOTE — Telephone Encounter (Signed)
Please advise and send

## 2019-11-26 NOTE — Telephone Encounter (Signed)
Pt is calling in stating that she called [facility] on [address] on 11/17/2019 and her Rx oxycodone 20 MG is on back order and the manufacture is no longer making the medication.  Pt stated that the pharmacy told her that we need to send in a new Rx for oxycodone 10 MG or 15 MG or another pain medication.  Pt prefer the oxycodone if Dr. Sarajane Jews thinks it is best for her.

## 2019-11-27 MED ORDER — OXYCODONE HCL 10 MG PO TABS: 20.0000 mg | ORAL_TABLET | ORAL | 0 refills | Status: DC | PRN

## 2019-11-27 NOTE — Telephone Encounter (Signed)
I sent in for the 10 mg pills to take 2 at a time

## 2019-12-05 ENCOUNTER — Telehealth: Payer: Self-pay

## 2019-12-05 NOTE — Telephone Encounter (Signed)
Spoke to pharmacist to given Dx code and medication tried before they will fill the Oxycodone that that sent to them on 11/27/19.  They will refax a form with information that is needed on it and also need to get last drug screen. Dm/cma

## 2019-12-06 NOTE — Telephone Encounter (Signed)
Oxycodone HCl 10 MG [place], [place] - [address], [address] Phone:  [phone]  Fax:  [phone]      Thurmond Butts from [facility] called to get a treatment plan and a tox screen sent .  They faxed a form requesting this information   ORDER #: [id number]  FAX: [phone]

## 2019-12-11 NOTE — Telephone Encounter (Signed)
A form was faxed to the office & Will be filled out & faxed back with the information needed.

## 2019-12-18 ENCOUNTER — Encounter: Payer: Self-pay | Admitting: Family Medicine

## 2019-12-18 ENCOUNTER — Telehealth (INDEPENDENT_AMBULATORY_CARE_PROVIDER_SITE_OTHER): Payer: Medicare Other | Admitting: Family Medicine

## 2019-12-18 ENCOUNTER — Other Ambulatory Visit: Payer: Self-pay

## 2019-12-18 DIAGNOSIS — M7918 Myalgia, other site: Secondary | ICD-10-CM | POA: Diagnosis not present

## 2019-12-18 DIAGNOSIS — F1190 Opioid use, unspecified, uncomplicated: Secondary | ICD-10-CM

## 2019-12-18 MED ORDER — HYDROCODONE-ACETAMINOPHEN 10-325 MG PO TABS: 1.0000 | ORAL_TABLET | Freq: Four times a day (QID) | ORAL | 0 refills | Status: DC | PRN

## 2019-12-18 MED ORDER — METHYLPHENIDATE HCL 20 MG PO TABS: 20.0000 mg | ORAL_TABLET | Freq: Two times a day (BID) | ORAL | 0 refills | Status: DC

## 2019-12-18 MED ORDER — OXYCODONE HCL 20 MG PO TABS: 1.0000 | ORAL_TABLET | ORAL | 0 refills | Status: AC | PRN

## 2019-12-18 MED ORDER — OXYCODONE HCL 20 MG PO TABS: 1.0000 | ORAL_TABLET | ORAL | 0 refills | Status: DC | PRN

## 2019-12-18 MED ORDER — HYDROCODONE-ACETAMINOPHEN 10-325 MG PO TABS: 1.0000 | ORAL_TABLET | Freq: Four times a day (QID) | ORAL | 0 refills | Status: AC | PRN

## 2019-12-18 NOTE — Progress Notes (Signed)
   Subjective:    Patient ID: Linda Kent, female    DOB: 17-Feb-1945, 74 y.o.   MRN: [id number]  HPI Virtual Visit via Telephone Note  I connected with the patient on 12/18/19 at  1:00 PM EST by telephone and verified that I am speaking with the correct person using two identifiers.   I discussed the limitations, risks, security and privacy concerns of performing an evaluation and management service by telephone and the availability of in person appointments. I also discussed with the patient that there may be a patient responsible charge related to this service. The patient expressed understanding and agreed to proceed.  Location patient: home Location provider: work or home office Participants present for the call: patient, provider Patient did not have a visit in the prior 7 days to address this/these issue(s).   History of Present Illness: Here for pain management. She has been struggling with some extra pain the past week, likely from the cold weather.  Indication for chronic opioid: myofascial pain Medication and dose: Noroc 10-325 and Oxycodone 20 mg # pills per month: 120 and 180 Last UDS date: 05-17-19 Opioid Treatment Agreement signed (Y/N): 04-06-17 Opioid Treatment Agreement last reviewed with patient:  12-18-19 [place] reviewed this encounter (include red flags): Yes    Observations/Objective: Patient sounds cheerful and well on the phone. I do not appreciate any SOB. Speech and thought processing are grossly intact. Patient reported vitals:  Assessment and Plan: Pain management, meds were refilled.  Alysia Penna, MD   Follow Up Instructions:     [phone] 5-10 [phone] 11-20 9443 21-30 I did not refer this patient for an OV in the next 24 hours for this/these issue(s).  I discussed the assessment and treatment plan with the patient. The patient was provided an opportunity to ask questions and all were answered. The patient agreed with the plan and demonstrated an  understanding of the instructions.   The patient was advised to call back or seek an in-person evaluation if the symptoms worsen or if the condition fails to improve as anticipated.  I provided 17 minutes of non-face-to-face time during this encounter.   Alysia Penna, MD    Review of Systems     Objective:   Physical Exam        Assessment & Plan:

## 2019-12-24 DIAGNOSIS — M810 Age-related osteoporosis without current pathological fracture: Secondary | ICD-10-CM | POA: Diagnosis not present

## 2019-12-27 DIAGNOSIS — Z23 Encounter for immunization: Secondary | ICD-10-CM | POA: Diagnosis not present

## 2019-12-31 DIAGNOSIS — E039 Hypothyroidism, unspecified: Secondary | ICD-10-CM | POA: Diagnosis not present

## 2019-12-31 DIAGNOSIS — E559 Vitamin D deficiency, unspecified: Secondary | ICD-10-CM | POA: Diagnosis not present

## 2019-12-31 DIAGNOSIS — M810 Age-related osteoporosis without current pathological fracture: Secondary | ICD-10-CM | POA: Diagnosis not present

## 2020-01-16 ENCOUNTER — Telehealth: Payer: Self-pay | Admitting: Family Medicine

## 2020-01-16 ENCOUNTER — Other Ambulatory Visit: Payer: Self-pay | Admitting: *Deleted

## 2020-01-16 MED ORDER — ZALEPLON 10 MG PO CAPS
10.0000 mg | ORAL_CAPSULE | Freq: Every evening | ORAL | 1 refills | Status: DC | PRN
Start: 2020-01-16 — End: 2020-02-14

## 2020-01-16 MED ORDER — ZALEPLON 10 MG PO CAPS
10.0000 mg | ORAL_CAPSULE | Freq: Every evening | ORAL | 1 refills | Status: DC | PRN
Start: 2020-01-16 — End: 2020-01-16

## 2020-01-16 NOTE — Telephone Encounter (Signed)
Pt is calling in needing a refill on zaleplon (SONATA) 10 MG   Pharm:  [facility]

## 2020-01-16 NOTE — Telephone Encounter (Signed)
Refilled Rx Sonata--sent to [facility]

## 2020-02-13 ENCOUNTER — Telehealth: Payer: Self-pay | Admitting: Family Medicine

## 2020-02-13 NOTE — Telephone Encounter (Signed)
Pt is calling in stating that she needs a refill of Rx zaleplon (SONATA) capsules 10 MG she only has a few on hand and would like to see if it can be called in today please.  Pharm: [facility] is needing 10-15 day for delivery to pt.

## 2020-02-14 MED ORDER — ZALEPLON 10 MG PO CAPS
10.0000 mg | ORAL_CAPSULE | Freq: Every evening | ORAL | 1 refills | Status: DC | PRN
Start: 2020-02-14 — End: 2020-08-13

## 2020-02-14 NOTE — Telephone Encounter (Signed)
Done

## 2020-02-29 ENCOUNTER — Telehealth: Payer: Medicare Other | Admitting: Family Medicine

## 2020-03-03 ENCOUNTER — Telehealth: Payer: Medicare Other | Admitting: Family Medicine

## 2020-03-03 ENCOUNTER — Encounter: Payer: Self-pay | Admitting: Family Medicine

## 2020-03-03 VITALS — Ht 64.5 in | Wt 150.0 lb

## 2020-03-03 NOTE — Progress Notes (Signed)
   Subjective:    Patient ID: Linda Kent, female    DOB: 1945/08/18, 75 y.o.   MRN: [id number]  HPI Virtual Visit via Telephone Note  I connected with the patient on 03/03/20 at  1:00 PM EST by telephone and verified that I am speaking with the correct person using two identifiers.   I discussed the limitations, risks, security and privacy concerns of performing an evaluation and management service by telephone and the availability of in person appointments. I also discussed with the patient that there may be a patient responsible charge related to this service. The patient expressed understanding and agreed to proceed.  Location patient: home Location provider: work or home office Participants present for the call: patient, provider Patient did not have a visit in the prior 7 days to address this/these issue(s).   History of Present Illness: She had called to refill her pain medications, but in checking her chart I see she has another refill available to her.   Observations/Objective: Patient sounds cheerful and well on the phone. I do not appreciate any SOB. Speech and thought processing are grossly intact. Patient reported vitals:  Assessment and Plan: She will get her last refills and then we will talk again in one month. Alysia Penna, MD   Follow Up Instructions:     [phone] 5-10 [phone] 11-20 9443 21-30 I did not refer this patient for an OV in the next 24 hours for this/these issue(s).  I discussed the assessment and treatment plan with the patient. The patient was provided an opportunity to ask questions and all were answered. The patient agreed with the plan and demonstrated an understanding of the instructions.   The patient was advised to call back or seek an in-person evaluation if the symptoms worsen or if the condition fails to improve as anticipated.  I provided 10 minutes of non-face-to-face time during this encounter.   Alysia Penna, MD    Review of  Systems     Objective:   Physical Exam        Assessment & Plan:

## 2020-03-31 ENCOUNTER — Telehealth (INDEPENDENT_AMBULATORY_CARE_PROVIDER_SITE_OTHER): Payer: Medicare Other | Admitting: Family Medicine

## 2020-03-31 ENCOUNTER — Encounter: Payer: Self-pay | Admitting: Family Medicine

## 2020-03-31 VITALS — Wt 150.0 lb

## 2020-03-31 DIAGNOSIS — M7918 Myalgia, other site: Secondary | ICD-10-CM

## 2020-03-31 DIAGNOSIS — F1190 Opioid use, unspecified, uncomplicated: Secondary | ICD-10-CM | POA: Diagnosis not present

## 2020-03-31 MED ORDER — METHYLPHENIDATE HCL 20 MG PO TABS: 20.0000 mg | ORAL_TABLET | Freq: Two times a day (BID) | ORAL | 0 refills | Status: DC

## 2020-03-31 MED ORDER — OXYCODONE HCL 20 MG PO TABS: 1.0000 | ORAL_TABLET | ORAL | 0 refills | Status: DC | PRN

## 2020-03-31 MED ORDER — METHYLPHENIDATE HCL 20 MG PO TABS
20.0000 mg | ORAL_TABLET | Freq: Two times a day (BID) | ORAL | 0 refills | Status: DC
Start: 2020-05-01 — End: 2020-03-31

## 2020-03-31 MED ORDER — OXYCODONE HCL 20 MG PO TABS
1.0000 | ORAL_TABLET | ORAL | 0 refills | Status: AC | PRN
Start: 2020-05-31 — End: 2020-06-30

## 2020-03-31 MED ORDER — HYDROCODONE-ACETAMINOPHEN 10-325 MG PO TABS: 1.0000 | ORAL_TABLET | Freq: Four times a day (QID) | ORAL | 0 refills | Status: DC | PRN

## 2020-03-31 MED ORDER — HYDROCODONE-ACETAMINOPHEN 10-325 MG PO TABS: 1.0000 | ORAL_TABLET | Freq: Four times a day (QID) | ORAL | 0 refills | Status: AC | PRN

## 2020-03-31 NOTE — Progress Notes (Signed)
   Subjective:    Patient ID: Linda Kent, female    DOB: 1945-03-16, 75 y.o.   MRN: [id number]  HPI Virtual Visit via Telephone Note  I connected with the patient on 03/31/20 at  1:00 PM EDT by telephone and verified that I am speaking with the correct person using two identifiers.   I discussed the limitations, risks, security and privacy concerns of performing an evaluation and management service by telephone and the availability of in person appointments. I also discussed with the patient that there may be a patient responsible charge related to this service. The patient expressed understanding and agreed to proceed.  Location patient: home Location provider: work or home office Participants present for the call: patient, provider Patient did not have a visit in the prior 7 days to address this/these issue(s).   History of Present Illness: Here for pain management, she is doing well.  Indication for chronic opioid: myofascial pain  Medication and dose: Norco 10-325 and Oxycodone 20 mg # pills per month: 120 and 180 Last UDS date: 05-17-19 Opioid Treatment Agreement signed (Y/N): 04-06-17 Opioid Treatment Agreement last reviewed with patient:  03-31-20 [place] reviewed this encounter (include red flags):  yes      Observations/Objective: Patient sounds cheerful and well on the phone. I do not appreciate any SOB. Speech and thought processing are grossly intact. Patient reported vitals:  Assessment and Plan: Pain management, meds were refilled.  Alysia Penna, MD   Follow Up Instructions:     [phone] 5-10 [phone] 11-20 [phone] 21-30 I did not refer this patient for an OV in the next 24 hours for this/these issue(s).  I discussed the assessment and treatment plan with the patient. The patient was provided an opportunity to ask questions and all were answered. The patient agreed with the plan and demonstrated an understanding of the instructions.   The patient was advised to  call back or seek an in-person evaluation if the symptoms worsen or if the condition fails to improve as anticipated.  I provided 14 minutes of non-face-to-face time during this encounter.   Alysia Penna, MD    Review of Systems     Objective:   Physical Exam        Assessment & Plan:

## 2020-07-02 ENCOUNTER — Telehealth (INDEPENDENT_AMBULATORY_CARE_PROVIDER_SITE_OTHER): Payer: Medicare Other | Admitting: Family Medicine

## 2020-07-02 ENCOUNTER — Other Ambulatory Visit: Payer: Self-pay

## 2020-07-02 ENCOUNTER — Encounter: Payer: Self-pay | Admitting: Family Medicine

## 2020-07-02 DIAGNOSIS — M7918 Myalgia, other site: Secondary | ICD-10-CM

## 2020-07-02 DIAGNOSIS — F1190 Opioid use, unspecified, uncomplicated: Secondary | ICD-10-CM

## 2020-07-02 MED ORDER — OXYCODONE HCL 20 MG PO TABS: 1.0000 | ORAL_TABLET | ORAL | 0 refills | Status: DC | PRN

## 2020-07-02 MED ORDER — METHYLPHENIDATE HCL 20 MG PO TABS: 20.0000 mg | ORAL_TABLET | Freq: Two times a day (BID) | ORAL | 0 refills | Status: DC

## 2020-07-02 MED ORDER — HYDROCODONE-ACETAMINOPHEN 10-325 MG PO TABS: 1.0000 | ORAL_TABLET | Freq: Four times a day (QID) | ORAL | 0 refills | Status: DC | PRN

## 2020-07-02 MED ORDER — OXYCODONE HCL 20 MG PO TABS: 1.0000 | ORAL_TABLET | ORAL | 0 refills | Status: AC | PRN

## 2020-07-02 MED ORDER — HYDROCODONE-ACETAMINOPHEN 10-325 MG PO TABS: 1.0000 | ORAL_TABLET | Freq: Four times a day (QID) | ORAL | 0 refills | Status: AC | PRN

## 2020-07-02 MED ORDER — LORAZEPAM 2 MG PO TABS: 2.0000 mg | ORAL_TABLET | Freq: Three times a day (TID) | ORAL | 5 refills | Status: DC

## 2020-07-02 NOTE — Progress Notes (Signed)
   Subjective:    Patient ID: Linda Kent, female    DOB: February 03, 1945, 75 y.o.   MRN: [id number]  HPI Virtual Visit via Telephone Note  I connected with the patient on 07/02/20 at  1:00 PM EDT by telephone and verified that I am speaking with the correct person using two identifiers.   I discussed the limitations, risks, security and privacy concerns of performing an evaluation and management service by telephone and the availability of in person appointments. I also discussed with the patient that there may be a patient responsible charge related to this service. The patient expressed understanding and agreed to proceed.  Location patient: home Location provider: work or home office Participants present for the call: patient, provider Patient did not have a visit in the prior 7 days to address this/these issue(s).   History of Present Illness: Here for pain management, she is doing well.    Observations/Objective: Patient sounds cheerful and well on the phone. I do not appreciate any SOB. Speech and thought processing are grossly intact. Patient reported vitals:  Assessment and Plan: Pain management. Indication for chronic opioid: myofascial pain Medication and dose: Norco 10-325 and Oxycodone 20 mg # pills per month: 120 and 180 Last UDS date: 05-17-19 Opioid Treatment Agreement signed (Y/N): 04-06-17 Opioid Treatment Agreement last reviewed with patient:  07-02-20 [facility] reviewed this encounter (include red flags): Yes Meds were refilled.  Alysia Penna, MD   Follow Up Instructions:     [phone] 5-10 [phone] 11-20 [phone] 21-30 I did not refer this patient for an OV in the next 24 hours for this/these issue(s).  I discussed the assessment and treatment plan with the patient. The patient was provided an opportunity to ask questions and all were answered. The patient agreed with the plan and demonstrated an understanding of the instructions.   The patient was advised to call  back or seek an in-person evaluation if the symptoms worsen or if the condition fails to improve as anticipated.  I provided 18 minutes of non-face-to-face time during this encounter.   Alysia Penna, MD     Review of Systems     Objective:   Physical Exam        Assessment & Plan:

## 2020-07-08 DIAGNOSIS — M810 Age-related osteoporosis without current pathological fracture: Secondary | ICD-10-CM | POA: Diagnosis not present

## 2020-07-29 ENCOUNTER — Encounter: Payer: Medicare Other | Admitting: Family Medicine

## 2020-08-12 ENCOUNTER — Other Ambulatory Visit: Payer: Self-pay

## 2020-08-13 ENCOUNTER — Encounter: Payer: Self-pay | Admitting: Family Medicine

## 2020-08-13 ENCOUNTER — Ambulatory Visit (INDEPENDENT_AMBULATORY_CARE_PROVIDER_SITE_OTHER): Payer: Medicare Other | Admitting: Family Medicine

## 2020-08-13 VITALS — BP 114/72 | HR 108 | Temp 97.8°F | Ht 64.5 in | Wt 149.6 lb

## 2020-08-13 DIAGNOSIS — M810 Age-related osteoporosis without current pathological fracture: Secondary | ICD-10-CM

## 2020-08-13 DIAGNOSIS — F411 Generalized anxiety disorder: Secondary | ICD-10-CM

## 2020-08-13 DIAGNOSIS — G4700 Insomnia, unspecified: Secondary | ICD-10-CM | POA: Diagnosis not present

## 2020-08-13 DIAGNOSIS — M7918 Myalgia, other site: Secondary | ICD-10-CM | POA: Diagnosis not present

## 2020-08-13 DIAGNOSIS — F909 Attention-deficit hyperactivity disorder, unspecified type: Secondary | ICD-10-CM

## 2020-08-13 DIAGNOSIS — Z0000 Encounter for general adult medical examination without abnormal findings: Secondary | ICD-10-CM

## 2020-08-13 DIAGNOSIS — E039 Hypothyroidism, unspecified: Secondary | ICD-10-CM | POA: Diagnosis not present

## 2020-08-13 DIAGNOSIS — R739 Hyperglycemia, unspecified: Secondary | ICD-10-CM

## 2020-08-13 DIAGNOSIS — K581 Irritable bowel syndrome with constipation: Secondary | ICD-10-CM

## 2020-08-13 DIAGNOSIS — E559 Vitamin D deficiency, unspecified: Secondary | ICD-10-CM

## 2020-08-13 DIAGNOSIS — E782 Mixed hyperlipidemia: Secondary | ICD-10-CM

## 2020-08-13 LAB — CBC WITH DIFFERENTIAL/PLATELET
Basophils Absolute: 0 10*3/uL (ref 0.0–0.1)
Basophils Relative: 0.4 % (ref 0.0–3.0)
Eosinophils Absolute: 0.3 10*3/uL (ref 0.0–0.7)
Eosinophils Relative: 3.4 % (ref 0.0–5.0)
HCT: 42.6 % (ref 36.0–46.0)
Hemoglobin: 14.1 g/dL (ref 12.0–15.0)
Lymphocytes Relative: 40.1 % (ref 12.0–46.0)
Lymphs Abs: 3.2 10*3/uL (ref 0.7–4.0)
MCHC: 33 g/dL (ref 30.0–36.0)
MCV: 98.2 fl (ref 78.0–100.0)
Monocytes Absolute: 0.6 10*3/uL (ref 0.1–1.0)
Monocytes Relative: 7 % (ref 3.0–12.0)
Neutro Abs: 4 10*3/uL (ref 1.4–7.7)
Neutrophils Relative %: 49.1 % (ref 43.0–77.0)
Platelets: 304 10*3/uL (ref 150.0–400.0)
RBC: 4.34 Mil/uL (ref 3.87–5.11)
RDW: 12.8 % (ref 11.5–15.5)
WBC: 8.1 10*3/uL (ref 4.0–10.5)

## 2020-08-13 LAB — LIPID PANEL
Cholesterol: 241 mg/dL — ABNORMAL HIGH (ref 0–200)
HDL: 52.1 mg/dL (ref >39.00)
NonHDL: 188.65
Total CHOL/HDL Ratio: 5
Triglycerides: 250 mg/dL — ABNORMAL HIGH (ref 0.0–149.0)
VLDL: 50 mg/dL — ABNORMAL HIGH (ref 0.0–40.0)

## 2020-08-13 LAB — BASIC METABOLIC PANEL
BUN: 11 mg/dL (ref 6–23)
CO2: 27 mEq/L (ref 19–32)
Calcium: 8.9 mg/dL (ref 8.4–10.5)
Chloride: 102 mEq/L (ref 96–112)
Creatinine, Ser: 0.81 mg/dL (ref 0.40–1.20)
GFR: 70.25 mL/min (ref >60.00)
Glucose, Bld: 94 mg/dL (ref 70–99)
Potassium: 4 mEq/L (ref 3.5–5.1)
Sodium: 136 mEq/L (ref 135–145)

## 2020-08-13 LAB — T3, FREE: T3, Free: 3.5 pg/mL (ref 2.3–4.2)

## 2020-08-13 LAB — HEPATIC FUNCTION PANEL
ALT: 18 U/L (ref 0–35)
AST: 22 U/L (ref 0–37)
Albumin: 3.8 g/dL (ref 3.5–5.2)
Alkaline Phosphatase: 92 U/L (ref 39–117)
Bilirubin, Direct: 0.1 mg/dL (ref 0.0–0.3)
Total Bilirubin: 0.4 mg/dL (ref 0.2–1.2)
Total Protein: 6.5 g/dL (ref 6.0–8.3)

## 2020-08-13 LAB — VITAMIN D 25 HYDROXY (VIT D DEFICIENCY, FRACTURES): VITD: 73.6 ng/mL (ref 30.00–100.00)

## 2020-08-13 LAB — HEMOGLOBIN A1C: Hgb A1c MFr Bld: 4.8 % (ref 4.6–6.5)

## 2020-08-13 LAB — LDL CHOLESTEROL, DIRECT: Direct LDL: 153 mg/dL

## 2020-08-13 LAB — TSH: TSH: 0.78 u[IU]/mL (ref 0.35–5.50)

## 2020-08-13 LAB — T4, FREE: Free T4: 0.94 ng/dL (ref 0.60–1.60)

## 2020-08-13 MED ORDER — METRONIDAZOLE 0.75 % EX CREA: 1.0000 "application " | TOPICAL_CREAM | Freq: Every day | CUTANEOUS | 3 refills | Status: DC

## 2020-08-13 MED ORDER — PERPHENAZINE 4 MG PO TABS: 4.0000 mg | ORAL_TABLET | ORAL | 0 refills | Status: DC | PRN

## 2020-08-13 MED ORDER — LORAZEPAM 2 MG PO TABS: 2.0000 mg | ORAL_TABLET | Freq: Three times a day (TID) | ORAL | 5 refills | Status: DC

## 2020-08-13 MED ORDER — MECLIZINE HCL 25 MG PO TABS: 25.0000 mg | ORAL_TABLET | ORAL | 5 refills | Status: DC | PRN

## 2020-08-13 MED ORDER — RIZATRIPTAN BENZOATE 10 MG PO TBDP: 10.0000 mg | ORAL_TABLET | ORAL | 5 refills | Status: DC | PRN

## 2020-08-13 MED ORDER — HALOBETASOL PROPIONATE 0.05 % EX CREA: TOPICAL_CREAM | Freq: Two times a day (BID) | CUTANEOUS | 5 refills | Status: DC

## 2020-08-13 MED ORDER — ZALEPLON 10 MG PO CAPS: 10.0000 mg | ORAL_CAPSULE | Freq: Every evening | ORAL | 1 refills | Status: DC | PRN

## 2020-08-13 MED ORDER — NONFORMULARY OR COMPOUNDED ITEM: 6 refills | Status: DC

## 2020-08-13 NOTE — Addendum Note (Signed)
Addended by: Elmer Picker on: 08/13/2020 09:48 AM   Modules accepted: Orders

## 2020-08-13 NOTE — Addendum Note (Signed)
Addended by: Elmer Picker on: 08/13/2020 09:54 AM   Modules accepted: Orders

## 2020-08-13 NOTE — Progress Notes (Signed)
Subjective:    Patient ID: Linda Kent, female    DOB: November 01, 1945, 75 y.o.   MRN: [id number]  HPI Here to follow up on issues. She is doing well in general. Her migraines have been well controlled. Her myofascial pain is controlled. She was due for a colonoscopy this year at [facility], but her previous provider has retired. She asks if she can see [facility] instead. She gets very little exercise but she eats a healthy diet.    Review of Systems  Constitutional: Negative.   HENT: Negative.    Eyes: Negative.   Respiratory: Negative.    Cardiovascular: Negative.   Gastrointestinal: Negative.   Genitourinary:  Negative for decreased urine volume, difficulty urinating, dyspareunia, dysuria, enuresis, flank pain, frequency, hematuria, pelvic pain and urgency.  Musculoskeletal:  Positive for myalgias.  Skin: Negative.   Neurological:  Positive for headaches.  Psychiatric/Behavioral: Negative.        Objective:   Physical Exam Constitutional:      General: She is not in acute distress.    Appearance: Normal appearance. She is well-developed.  HENT:     Head: Normocephalic and atraumatic.     Right Ear: External ear normal.     Left Ear: External ear normal.     Nose: Nose normal.     Mouth/Throat:     Pharynx: No oropharyngeal exudate.  Eyes:     General: No scleral icterus.    Conjunctiva/sclera: Conjunctivae normal.     Pupils: Pupils are equal, round, and reactive to light.  Neck:     Thyroid: No thyromegaly.     Vascular: No JVD.  Cardiovascular:     Rate and Rhythm: Normal rate and regular rhythm.     Heart sounds: Normal heart sounds. No murmur heard.   No friction rub. No gallop.  Pulmonary:     Effort: Pulmonary effort is normal. No respiratory distress.     Breath sounds: Normal breath sounds. No wheezing or rales.  Chest:     Chest wall: No tenderness.  Abdominal:     General: Bowel sounds are normal. There is no distension.     Palpations: Abdomen is soft.  There is no mass.     Tenderness: There is no abdominal tenderness. There is no guarding or rebound.  Musculoskeletal:        General: No tenderness. Normal range of motion.     Cervical back: Normal range of motion and neck supple.  Lymphadenopathy:     Cervical: No cervical adenopathy.  Skin:    General: Skin is warm and dry.     Findings: No erythema or rash.  Neurological:     Mental Status: She is alert and oriented to person, place, and time.     Cranial Nerves: No cranial nerve deficit.     Motor: No abnormal muscle tone.     Coordination: Coordination normal.     Deep Tendon Reflexes: Reflexes are normal and symmetric. Reflexes normal.  Psychiatric:        Behavior: Behavior normal.        Thought Content: Thought content normal.        Judgment: Judgment normal.          Assessment & Plan:  Her myofascial pain and migraines are well controlled. Her depression and ADHD are stable. We will get fasting labs to check lipids, etc. We will refer her to [facility] for the colonoscopy. We spent 35 minutes reviewing records  and discussing these issues.  Alysia Penna, MD

## 2020-08-14 ENCOUNTER — Telehealth: Payer: Self-pay | Admitting: Family Medicine

## 2020-08-14 NOTE — Telephone Encounter (Signed)
Pt is calling back and is aware that the provider and CMA has left for today and will not be in tomorrow.  But pt would like to have a call back after 12:00 P.M. tomorrow due to her not getting much sleep at night due to the chronic pain that she is having.  Pt decline an appointment.

## 2020-08-14 NOTE — Telephone Encounter (Signed)
Patient returned [facility] call

## 2020-08-15 NOTE — Telephone Encounter (Signed)
Spoke with pt stated that she wanted to let Dr Sarajane Jews know that she took the Rx for the compound cream to the pharmacy and she will be picking up the cream today afternoon.

## 2020-08-17 LAB — DRUG MONITOR, PANEL 1, W/CONF, URINE
Alphahydroxyalprazolam: NEGATIVE ng/mL (ref <25)
Alphahydroxymidazolam: NEGATIVE ng/mL (ref <50)
Alphahydroxytriazolam: NEGATIVE ng/mL (ref <50)
Aminoclonazepam: NEGATIVE ng/mL (ref <25)
Amphetamines: NEGATIVE ng/mL (ref <500)
Barbiturates: NEGATIVE ng/mL (ref <300)
Benzodiazepines: POSITIVE ng/mL — AB (ref <100)
Cocaine Metabolite: NEGATIVE ng/mL (ref <150)
Codeine: NEGATIVE ng/mL (ref <50)
Creatinine: 54 mg/dL (ref >=20.0)
Hydrocodone: NEGATIVE ng/mL (ref <50)
Hydromorphone: NEGATIVE ng/mL (ref <50)
Hydroxyethylflurazepam: NEGATIVE ng/mL (ref <50)
Lorazepam: 1425 ng/mL — ABNORMAL HIGH (ref <50)
Marijuana Metabolite: NEGATIVE ng/mL (ref <20)
Marijuana Metabolite: NEGATIVE ng/mL (ref <5)
Methadone Metabolite: NEGATIVE ng/mL (ref <100)
Morphine: NEGATIVE ng/mL (ref <50)
Nordiazepam: NEGATIVE ng/mL (ref <50)
Norhydrocodone: 123 ng/mL — ABNORMAL HIGH (ref <50)
Noroxycodone: >10000 ng/mL — ABNORMAL HIGH (ref <50)
Opiates: POSITIVE ng/mL — AB (ref <100)
Oxazepam: NEGATIVE ng/mL (ref <50)
Oxidant: NEGATIVE ug/mL (ref <200)
Oxycodone: 2705 ng/mL — ABNORMAL HIGH (ref <50)
Oxycodone: POSITIVE ng/mL — AB (ref <100)
Oxymorphone: 3710 ng/mL — ABNORMAL HIGH (ref <50)
Phencyclidine: NEGATIVE ng/mL (ref <25)
Temazepam: NEGATIVE ng/mL (ref <50)
pH: 6.8 (ref 4.5–9.0)

## 2020-08-17 LAB — DM TEMPLATE

## 2020-08-25 ENCOUNTER — Telehealth: Payer: Self-pay

## 2020-08-25 NOTE — Telephone Encounter (Signed)
Patient returned call for results, pt aware and verbalized understanding per patient request pt would like results mailed to her home.

## 2020-08-25 NOTE — Telephone Encounter (Signed)
Spoke with pt reviewed lab results with pt verbalized understanding, copy of lab results mailed to pt address

## 2020-08-26 DIAGNOSIS — Z23 Encounter for immunization: Secondary | ICD-10-CM | POA: Diagnosis not present

## 2020-09-15 ENCOUNTER — Telehealth: Payer: Self-pay | Admitting: Family Medicine

## 2020-09-15 ENCOUNTER — Telehealth: Payer: Self-pay

## 2020-09-15 NOTE — Telephone Encounter (Signed)
Left message for patient to call back and schedule Medicare Annual Wellness Visit (AWV) either virtually or in office. Left  my Linda Kent number [phone]   Last AWV 10/10/19 please schedule at anytime with [facility] Nurse Health Advisor 1 or 2   This should be a 45 minute visit.

## 2020-09-15 NOTE — Telephone Encounter (Signed)
Stop the dissolving tablets and call in Maxalt 10 mg to take as needed for migraines, #10 with 11 rf

## 2020-09-15 NOTE — Telephone Encounter (Signed)
Please advise if okay to change to tablets

## 2020-09-16 ENCOUNTER — Other Ambulatory Visit: Payer: Self-pay

## 2020-09-16 MED ORDER — RIZATRIPTAN BENZOATE 10 MG PO TABS: 10.0000 mg | ORAL_TABLET | ORAL | 11 refills | Status: DC | PRN

## 2020-09-16 NOTE — Telephone Encounter (Signed)
Lvm for patient informing Maxalt '10mg'$  has been sent to [facility] on [address].

## 2020-09-17 ENCOUNTER — Other Ambulatory Visit: Payer: Self-pay | Admitting: Family Medicine

## 2020-10-07 ENCOUNTER — Encounter: Payer: Self-pay | Admitting: Family Medicine

## 2020-10-07 ENCOUNTER — Telehealth (INDEPENDENT_AMBULATORY_CARE_PROVIDER_SITE_OTHER): Payer: Medicare Other | Admitting: Family Medicine

## 2020-10-07 DIAGNOSIS — M7918 Myalgia, other site: Secondary | ICD-10-CM

## 2020-10-07 DIAGNOSIS — F1190 Opioid use, unspecified, uncomplicated: Secondary | ICD-10-CM

## 2020-10-07 MED ORDER — HYDROCODONE-ACETAMINOPHEN 10-325 MG PO TABS: 1.0000 | ORAL_TABLET | Freq: Four times a day (QID) | ORAL | 0 refills | Status: DC | PRN

## 2020-10-07 MED ORDER — METHYLPHENIDATE HCL 10 MG PO TABS: 10.0000 mg | ORAL_TABLET | Freq: Four times a day (QID) | ORAL | 0 refills | Status: DC

## 2020-10-07 MED ORDER — HYDROCODONE-ACETAMINOPHEN 10-325 MG PO TABS: 1.0000 | ORAL_TABLET | Freq: Four times a day (QID) | ORAL | 0 refills | Status: AC | PRN

## 2020-10-07 MED ORDER — OXYCODONE HCL 20 MG PO TABS: 1.0000 | ORAL_TABLET | ORAL | 0 refills | Status: DC | PRN

## 2020-10-07 MED ORDER — OXYCODONE HCL 20 MG PO TABS: 1.0000 | ORAL_TABLET | ORAL | 0 refills | Status: AC | PRN

## 2020-10-07 NOTE — Progress Notes (Signed)
   Subjective:    Patient ID: Linda Kent, female    DOB: April 15, 1945, 75 y.o.   MRN: [id number]  HPI Virtual Visit via Telephone Note  I connected with the patient on 10/07/20 at  1:30 PM EDT by telephone and verified that I am speaking with the correct person using two identifiers.   I discussed the limitations, risks, security and privacy concerns of performing an evaluation and management service by telephone and the availability of in person appointments. I also discussed with the patient that there may be a patient responsible charge related to this service. The patient expressed understanding and agreed to proceed.  Location patient: home Location provider: work or home office Participants present for the call: patient, provider Patient did not have a visit in the prior 7 days to address this/these issue(s).   History of Present Illness: Here for pain management, she is doing well.    Observations/Objective: Patient sounds cheerful and well on the phone. I do not appreciate any SOB. Speech and thought processing are grossly intact. Patient reported vitals:  Assessment and Plan: Pain management. Indication for chronic opioid: myofascial pain Medication and dose: Norco 10-325 and Oxycodone 20 mg # pills per month: 120 and 180 Last UDS date: 08-13-20 Opioid Treatment Agreement signed (Y/N): 04-06-17 Opioid Treatment Agreement last reviewed with patient:  10-07-20 [facility] reviewed this encounter (include red flags): Yes Meds were refilled. Alysia Penna, MD   Follow Up Instructions:     [phone] 5-10 [phone] 11-20 [phone] 21-30 I did not refer this patient for an OV in the next 24 hours for this/these issue(s).  I discussed the assessment and treatment plan with the patient. The patient was provided an opportunity to ask questions and all were answered. The patient agreed with the plan and demonstrated an understanding of the instructions.   The patient was advised to call  back or seek an in-person evaluation if the symptoms worsen or if the condition fails to improve as anticipated.  I provided 19 minutes of non-face-to-face time during this encounter.   Alysia Penna, MD     Review of Systems     Objective:   Physical Exam        Assessment & Plan:

## 2020-10-13 ENCOUNTER — Ambulatory Visit (INDEPENDENT_AMBULATORY_CARE_PROVIDER_SITE_OTHER): Payer: Medicare Other

## 2020-10-13 DIAGNOSIS — Z0000 Encounter for general adult medical examination without abnormal findings: Secondary | ICD-10-CM | POA: Diagnosis not present

## 2020-10-13 NOTE — Patient Instructions (Signed)
Linda Kent , Thank you for taking time to come for your Medicare Wellness Visit. I appreciate your ongoing commitment to your health goals. Please review the following plan we discussed and let me know if I can assist you in the future.   Screening recommendations/referrals: Colonoscopy: no longer required  Mammogram: no longer required  Bone Density: 09/21/2016 Recommended yearly ophthalmology/optometry visit for glaucoma screening and checkup Recommended yearly dental visit for hygiene and checkup  Vaccinations: Influenza vaccine: due fall 2022  Pneumococcal vaccine: completed  Tdap vaccine: due upon injury  Shingles vaccine: will consider     Advanced directives: will provide copies   Conditions/risks identified: none   Next appointment: none    Preventive Care 27 Years and Older, Female Preventive care refers to lifestyle choices and visits with your health care provider that can promote health and wellness. What does preventive care include? A yearly physical exam. This is also called an annual well check. Dental exams once or twice a year. Routine eye exams. Ask your health care provider how often you should have your eyes checked. Personal lifestyle choices, including: Daily care of your teeth and gums. Regular physical activity. Eating a healthy diet. Avoiding tobacco and drug use. Limiting alcohol use. Practicing safe sex. Taking low-dose aspirin every day. Taking vitamin and mineral supplements as recommended by your health care provider. What happens during an annual well check? The services and screenings done by your health care provider during your annual well check will depend on your age, overall health, lifestyle risk factors, and family history of disease. Counseling  Your health care provider may ask you questions about your: Alcohol use. Tobacco use. Drug use. Emotional well-being. Home and relationship well-being. Sexual activity. Eating  habits. History of falls. Memory and ability to understand (cognition). Work and work Statistician. Reproductive health. Screening  You may have the following tests or measurements: Height, weight, and BMI. Blood pressure. Lipid and cholesterol levels. These may be checked every 5 years, or more frequently if you are over 11 years old. Skin check. Lung cancer screening. You may have this screening every year starting at age 80 if you have a 30-pack-year history of smoking and currently smoke or have quit within the past 15 years. Fecal occult blood test (FOBT) of the stool. You may have this test every year starting at age 50. Flexible sigmoidoscopy or colonoscopy. You may have a sigmoidoscopy every 5 years or a colonoscopy every 10 years starting at age 38. Hepatitis C blood test. Hepatitis B blood test. Sexually transmitted disease (STD) testing. Diabetes screening. This is done by checking your blood sugar (glucose) after you have not eaten for a while (fasting). You may have this done every 1-3 years. Bone density scan. This is done to screen for osteoporosis. You may have this done starting at age 69. Mammogram. This may be done every 1-2 years. Talk to your health care provider about how often you should have regular mammograms. Talk with your health care provider about your test results, treatment options, and if necessary, the need for more tests. Vaccines  Your health care provider may recommend certain vaccines, such as: Influenza vaccine. This is recommended every year. Tetanus, diphtheria, and acellular pertussis (Tdap, Td) vaccine. You may need a Td booster every 10 years. Zoster vaccine. You may need this after age 55. Pneumococcal 13-valent conjugate (PCV13) vaccine. One dose is recommended after age 20. Pneumococcal polysaccharide (PPSV23) vaccine. One dose is recommended after age 36. Talk to your  health care provider about which screenings and vaccines you need and how  often you need them. This information is not intended to replace advice given to you by your health care provider. Make sure you discuss any questions you have with your health care provider. Document Released: 01/31/2015 Document Revised: 09/24/2015 Document Reviewed: 11/05/2014 Elsevier Interactive Patient Education  2017 [employer] Prevention in the Home Falls can cause injuries. They can happen to people of all ages. There are many things you can do to make your home safe and to help prevent falls. What can I do on the outside of my home? Regularly fix the edges of walkways and driveways and fix any cracks. Remove anything that might make you trip as you walk through a door, such as a raised step or threshold. Trim any bushes or trees on the path to your home. Use bright outdoor lighting. Clear any walking paths of anything that might make someone trip, such as rocks or tools. Regularly check to see if handrails are loose or broken. Make sure that both sides of any steps have handrails. Any raised decks and porches should have guardrails on the edges. Have any leaves, snow, or ice cleared regularly. Use sand or salt on walking paths during winter. Clean up any spills in your garage right away. This includes oil or grease spills. What can I do in the bathroom? Use night lights. Install grab bars by the toilet and in the tub and shower. Do not use towel bars as grab bars. Use non-skid mats or decals in the tub or shower. If you need to sit down in the shower, use a plastic, non-slip stool. Keep the floor dry. Clean up any water that spills on the floor as soon as it happens. Remove soap buildup in the tub or shower regularly. Attach bath mats securely with double-sided non-slip rug tape. Do not have throw rugs and other things on the floor that can make you trip. What can I do in the bedroom? Use night lights. Make sure that you have a light by your bed that is easy to  reach. Do not use any sheets or blankets that are too big for your bed. They should not hang down onto the floor. Have a firm chair that has side arms. You can use this for support while you get dressed. Do not have throw rugs and other things on the floor that can make you trip. What can I do in the kitchen? Clean up any spills right away. Avoid walking on wet floors. Keep items that you use a lot in easy-to-reach places. If you need to reach something above you, use a strong step stool that has a grab bar. Keep electrical cords out of the way. Do not use floor polish or wax that makes floors slippery. If you must use wax, use non-skid floor wax. Do not have throw rugs and other things on the floor that can make you trip. What can I do with my stairs? Do not leave any items on the stairs. Make sure that there are handrails on both sides of the stairs and use them. Fix handrails that are broken or loose. Make sure that handrails are as long as the stairways. Check any carpeting to make sure that it is firmly attached to the stairs. Fix any carpet that is loose or worn. Avoid having throw rugs at the top or bottom of the stairs. If you do have throw rugs, attach them  to the floor with carpet tape. Make sure that you have a light switch at the top of the stairs and the bottom of the stairs. If you do not have them, ask someone to add them for you. What else can I do to help prevent falls? Wear shoes that: Do not have high heels. Have rubber bottoms. Are comfortable and fit you well. Are closed at the toe. Do not wear sandals. If you use a stepladder: Make sure that it is fully opened. Do not climb a closed stepladder. Make sure that both sides of the stepladder are locked into place. Ask someone to hold it for you, if possible. Clearly mark and make sure that you can see: Any grab bars or handrails. First and last steps. Where the edge of each step is. Use tools that help you move  around (mobility aids) if they are needed. These include: Canes. Walkers. Scooters. Crutches. Turn on the lights when you go into a dark area. Replace any light bulbs as soon as they burn out. Set up your furniture so you have a clear path. Avoid moving your furniture around. If any of your floors are uneven, fix them. If there are any pets around you, be aware of where they are. Review your medicines with your doctor. Some medicines can make you feel dizzy. This can increase your chance of falling. Ask your doctor what other things that you can do to help prevent falls. This information is not intended to replace advice given to you by your health care provider. Make sure you discuss any questions you have with your health care provider. Document Released: 10/31/2008 Document Revised: 06/12/2015 Document Reviewed: 02/08/2014 Elsevier Interactive Patient Education  2017 [employer].

## 2020-10-13 NOTE — Progress Notes (Signed)
Subjective:   Linda Kent is a 75 y.o. female who presents for Medicare Annual (Subsequent) preventive examination.   I connected with Linda Kent today by telephone and verified that I am speaking with the correct person using two identifiers. Location patient: home Location provider: work Persons participating in the virtual visit: patient, provider.   I discussed the limitations, risks, security and privacy concerns of performing an evaluation and management service by telephone and the availability of in person appointments. I also discussed with the patient that there may be a patient responsible charge related to this service. The patient expressed understanding and verbally consented to this telephonic visit.    Interactive audio and video telecommunications were attempted between this provider and patient, however failed, due to patient having technical difficulties OR patient did not have access to video capability.  We continued and completed visit with audio only.    Review of Systems    N/a       Objective:    There were no vitals filed for this visit. There is no height or weight on file to calculate BMI.  Advanced Directives 10/10/2019  Does Patient Have a Medical Advance Directive? Yes  Type of Paramedic of [place];Living will  Does patient want to make changes to medical advance directive? No - Patient declined  Copy of [place] in Chart? No - copy requested    Current Medications (verified) Outpatient Encounter Medications as of 10/13/2020  Medication Sig   Ascorbic Acid (VITAMIN C) 1000 MG tablet Take 1,000 mg by mouth 2 (two) times daily.    aspirin 81 MG EC tablet Take 81 mg by mouth daily.   Calcium 1500 MG tablet Take 1,500 mg by mouth daily. With Vitamin D   denosumab (PROLIA) 60 MG/ML SOLN injection Inject 60 mg into the skin every 6 (six) months. Administer in upper arm, thigh, or abdomen   halobetasol  (ULTRAVATE) 0.05 % cream Apply topically 2 (two) times daily.   [START ON 12/07/2020] HYDROcodone-acetaminophen (NORCO) 10-325 MG tablet Take 1 tablet by mouth every 6 (six) hours as needed.   levothyroxine (SYNTHROID, LEVOTHROID) 100 MCG tablet Take 100 mcg by mouth daily. Brand name only    LORazepam (ATIVAN) 2 MG tablet Take 1 tablet (2 mg total) by mouth 3 (three) times daily.   meclizine (ANTIVERT) 25 MG tablet Take 1 tablet (25 mg total) by mouth every 4 (four) hours as needed for dizziness.   Melatonin 3 MG CAPS Take 9 mg by mouth at bedtime.   [START ON 12/07/2020] methylphenidate (RITALIN) 10 MG tablet Take 1 tablet (10 mg total) by mouth in the morning, at noon, in the evening, and at bedtime.   metroNIDAZOLE (METROCREAM) 0.75 % cream Apply 1 application topically daily.   Multiple Vitamin (MULTIVITAMIN) tablet Take 1 tablet by mouth daily. Centrum 50 +   NON FORMULARY    NONFORMULARY OR COMPOUNDED ITEM APPLY A DIME SIZED AMOUNT TO AFFECTED AREA 4 TIMES DAILY AS NEEDED.   [START ON 12/07/2020] Oxycodone HCl 20 MG TABS Take 1 tablet (20 mg total) by mouth every 4 (four) hours as needed (pain).   perphenazine (TRILAFON) 4 MG tablet Take 1 tablet (4 mg total) by mouth every 4 (four) hours as needed (headaches). For migraines   Probiotic Product (ALIGN PO) Take 1 capsule by mouth daily.    rizatriptan (MAXALT) 10 MG tablet Take 1 tablet (10 mg total) by mouth as needed for migraine.  topiramate (TOPAMAX) 50 MG tablet TAKE 3 TABLETS BY MOUTH EVERY DAY   zaleplon (SONATA) 10 MG capsule Take 1 capsule (10 mg total) by mouth at bedtime as needed. for sleep   No facility-administered encounter medications on file as of 10/13/2020.    Allergies (verified) Ezetimibe, Atorvastatin calcium [atorvastatin], Crestor [rosuvastatin calcium], and Cephalexin   History: Past Medical History:  Diagnosis Date   Acne rosacea    ADD (attention deficit disorder)    Back pain    Chronic insomnia     Colon polyps    Depression    Hyperlipidemia    Hypothyroidism    sees Dr. Elmarie Shiley    IBS (irritable bowel syndrome)    Migraines    sees Dr. Orie Rout    Myofascial pain syndrome    sees Dr. Elta Guadeloupe Philips    Osteoporosis    sees Dr. Elmarie Shiley    Post menopausal syndrome    Recurrent cystitis    after intercourse, controlled with macrobid    Routine gynecological examination    sees [facility]    Past Surgical History:  Procedure Laterality Date   ABDOMINAL HYSTERECTOMY     parital for prolapse   APPENDECTOMY     COLONOSCOPY  2017   per Dr. Amedeo Plenty, adenomatous polyps, repeat in 5 yrs    OVARIAN CYST REMOVAL     excised on left    TONSILLECTOMY     Family History  Problem Relation Age of Onset   Other Brother        low serotonin levels    Other Sister        low serotonin levels   Colon polyps Sister    Cancer Other 109       puncle decreased from colon cancer    Social History   Socioeconomic History   Marital status: Married    Spouse name: Not on file   Number of children: Not on file   Years of education: Not on file   Highest education level: Not on file  Occupational History   Not on file  Tobacco Use   Smoking status: Never   Smokeless tobacco: Never  Substance and Sexual Activity   Alcohol use: No    Alcohol/week: 0.0 standard drinks   Drug use: No   Sexual activity: Yes  Other Topics Concern   Not on file  Social History Narrative   Not on file   Social Determinants of Health   Financial Resource Strain: Low Risk    Difficulty of Paying Living Expenses: Not hard at all  Food Insecurity: No Food Insecurity   Worried About Charity fundraiser in the Last Year: Never true   Ran Out of Food in the Last Year: Never true  Transportation Needs: No Transportation Needs   Lack of Transportation (Medical): No   Lack of Transportation (Non-Medical): No  Physical Activity: Inactive   Days of Exercise per Week: 0 days   Minutes of  Exercise per Session: 0 min  Stress: No Stress Concern Present   Feeling of Stress : Not at all  Social Connections: Socially Isolated   Frequency of Communication with Friends and Family: Once a week   Frequency of Social Gatherings with Friends and Family: Once a week   Attends Religious Services: Never   Marine scientist or Organizations: No   Attends Archivist Meetings: Never   Marital Status: Married    Tobacco Counseling Counseling given:  Not Answered   Clinical Intake:                 Diabetic?no         Activities of Daily Living No flowsheet data found.  Patient Care Team: Laurey Morale, MD as PCP - General (Family Medicine)  Indicate any recent Medical Services you may have received from other than [facility] providers in the past year (date may be approximate).     Assessment:   This is a routine wellness examination for Hermela.  Hearing/Vision screen No results found.  Dietary issues and exercise activities discussed:     Goals Addressed   None    Depression Screen PHQ 2/9 Scores 10/10/2019 06/11/2016  PHQ - 2 Score 1 0  PHQ- 9 Score 1 -    Fall Risk Fall Risk  08/13/2020 10/10/2019 12/13/2018 06/11/2016 12/26/2015  Falls in the past year? 0 1 1 No Yes  Comment - - Emmi Telephone Survey: data to providers prior to load - Emmi Telephone Survey: data to providers prior to load  Number falls in past yr: - 0 1 - 1  Comment - - Emmi Telephone Survey Actual Response = 3 - Emmi Telephone Survey Actual Response = 1  Injury with Fall? - 0 1 - No  Risk for fall due to : - History of fall(s);Impaired balance/gait - - -  Follow up - Falls evaluation completed;Falls prevention discussed - - -    FALL RISK PREVENTION PERTAINING TO T HE HOME:  Any stairs in or around the home? No  If so, are there any without handrails? No  Home free of loose throw rugs in walkways, pet beds, electrical cords, etc? Yes  Adequate lighting in your home  to reduce risk of falls? Yes   ASSISTIVE DEVICES UTILIZED TO PREVENT FALLS:  Life alert? No  Use of a cane, walker or w/c? No  Grab bars in the bathroom? No  Shower chair or bench in shower? No  Elevated toilet seat or a handicapped toilet? No    Cognitive Function:  Normal cognitive status assessed by direct observation by this Nurse Health Advisor. No abnormalities found.        Immunizations Immunization History  Administered Date(s) Administered   Influenza Split 11/05/2010, 10/04/2012   Influenza Whole 10/24/2007, 10/29/2008   Influenza, High Dose Seasonal PF 10/22/2013, 10/14/2017, 10/12/2018   Influenza-Unspecified 10/02/2014, 10/15/2015   PFIZER(Purple Top)SARS-COV-2 Vaccination 02/06/2019, 02/27/2019   Pneumococcal Conjugate-13 06/11/2015   Pneumococcal Polysaccharide-23 10/24/2007   Td 09/26/2007    TDAP status: Due, Education has been provided regarding the importance of this vaccine. Advised may receive this vaccine at local pharmacy or Health Dept. Aware to provide a copy of the vaccination record if obtained from local pharmacy or Health Dept. Verbalized acceptance and understanding.  Flu Vaccine status: Up to date  Pneumococcal vaccine status: Up to date  Covid-19 vaccine status: Completed vaccines  Qualifies for Shingles Vaccine? Yes   Zostavax completed No   Shingrix Completed?: No.    Education has been provided regarding the importance of this vaccine. Patient has been advised to call insurance company to determine out of pocket expense if they have not yet received this vaccine. Advised may also receive vaccine at local pharmacy or Health Dept. Verbalized acceptance and understanding.  Screening Tests Health Maintenance  Topic Date Due   Hepatitis C Screening  Never done   TETANUS/TDAP  09/25/2017   INFLUENZA VACCINE  08/18/2020   Zoster Vaccines-  Shingrix (1 of 2) 08/13/2021 (Originally 09/02/1993)   COVID-19 Vaccine (4 - Booster for [place] series)  12/26/2020   DEXA SCAN  Completed   HPV VACCINES  Aged Out    Health Maintenance  Health Maintenance Due  Topic Date Due   Hepatitis C Screening  Never done   TETANUS/TDAP  09/25/2017   INFLUENZA VACCINE  08/18/2020    Colorectal cancer screening: No longer required.   Mammogram status: No longer required due to age.  Bone Density status: Completed 09/21/2016. Results reflect: Bone density results: OSTEOPOROSIS. Repeat every 2 years.  Lung Cancer Screening: (Low Dose CT Chest recommended if Age 105-80 years, 30 pack-year currently smoking OR have quit w/in 15years.) does not qualify.   Lung Cancer Screening Referral: n/a  Additional Screening:  Hepatitis C Screening: does qualify;   Vision Screening: Recommended annual ophthalmology exams for early detection of glaucoma and other disorders of the eye. Is the patient up to date with their annual eye exam?  Yes  Who is the provider or what is the name of the office in which the patient attends annual eye exams? [facility]  If pt is not established with a provider, would they like to be referred to a provider to establish care? No .   Dental Screening: Recommended annual dental exams for proper oral hygiene  Community Resource Referral / Chronic Care Management: CRR required this visit?  No   CCM required this visit?  No      Plan:     I have personally reviewed and noted the following in the patient's chart:   Medical and social history Use of alcohol, tobacco or illicit drugs  Current medications and supplements including opioid prescriptions.  Functional ability and status Nutritional status Physical activity Advanced directives List of other physicians Hospitalizations, surgeries, and ER visits in previous 12 months Vitals Screenings to include cognitive, depression, and falls Referrals and appointments  In addition, I have reviewed and discussed with patient certain preventive protocols, quality metrics, and  best practice recommendations. A written personalized care plan for preventive services as well as general preventive health recommendations were provided to patient.     Randel Pigg, LPN   [id number]/64/4034   Nurse Notes: none

## 2020-10-14 ENCOUNTER — Telehealth: Payer: Self-pay | Admitting: Family Medicine

## 2020-10-14 NOTE — Telephone Encounter (Addendum)
Called [facility], patient currently has two prescriptions on file for Methylphenidate10mg   to be taken twice in morning, and afternoon.   Pharmacy to fill medication and will call patient when ready

## 2020-10-14 NOTE — Telephone Encounter (Signed)
PT called to request a refill of their methylphenidate (RITALIN) 20 MG tablet to be called in. She would like to get 60 pills as she takes a pill twice a day. Please advise and refill at [id number] [place], [place] [address].

## 2020-10-24 DIAGNOSIS — Z23 Encounter for immunization: Secondary | ICD-10-CM | POA: Diagnosis not present

## 2020-10-27 ENCOUNTER — Telehealth: Payer: Self-pay

## 2020-10-27 NOTE — Telephone Encounter (Signed)
Patient called requesting Rx refill  methylphenidate (RITALIN) 10 MG tablet  Patient stated that se spoke with pharmacy and was told Prior Auth was needed for more refills.Pt will be out of meds this week.

## 2020-10-27 NOTE — Telephone Encounter (Signed)
PA has been submitted.

## 2020-10-27 NOTE — Telephone Encounter (Signed)
Prior authorization started  (Key: [id number]) methylphenidate (RITALIN) 10 MG tablet

## 2020-10-27 NOTE — Telephone Encounter (Signed)
Rx Bin: [id number] PCN: [id number]  Group: [id number] ID: [id number]

## 2020-10-28 NOTE — Telephone Encounter (Signed)
Lvm for  patient that medication was Approved on October 10 Request Reference Number: [id number]. METHYLPHENID TAB 10MG  is approved through 01/17/2022. Your patient may now fill this prescription and it will be covered.

## 2020-12-17 DIAGNOSIS — E039 Hypothyroidism, unspecified: Secondary | ICD-10-CM | POA: Diagnosis not present

## 2020-12-17 DIAGNOSIS — M810 Age-related osteoporosis without current pathological fracture: Secondary | ICD-10-CM | POA: Diagnosis not present

## 2020-12-17 DIAGNOSIS — E559 Vitamin D deficiency, unspecified: Secondary | ICD-10-CM | POA: Diagnosis not present

## 2020-12-20 ENCOUNTER — Emergency Department (HOSPITAL_BASED_OUTPATIENT_CLINIC_OR_DEPARTMENT_OTHER): Payer: Medicare Other

## 2020-12-20 ENCOUNTER — Emergency Department (HOSPITAL_BASED_OUTPATIENT_CLINIC_OR_DEPARTMENT_OTHER)
Admission: EM | Admit: 2020-12-20 | Discharge: 2020-12-20 | Disposition: A | Discharge status: Home / Self Care | Payer: Medicare Other | Attending: Emergency Medicine | Admitting: Emergency Medicine

## 2020-12-20 ENCOUNTER — Encounter (HOSPITAL_BASED_OUTPATIENT_CLINIC_OR_DEPARTMENT_OTHER): Payer: Self-pay

## 2020-12-20 ENCOUNTER — Other Ambulatory Visit: Payer: Self-pay

## 2020-12-20 DIAGNOSIS — R600 Localized edema: Secondary | ICD-10-CM | POA: Diagnosis not present

## 2020-12-20 DIAGNOSIS — E039 Hypothyroidism, unspecified: Secondary | ICD-10-CM | POA: Insufficient documentation

## 2020-12-20 DIAGNOSIS — S59912A Unspecified injury of left forearm, initial encounter: Secondary | ICD-10-CM | POA: Diagnosis present

## 2020-12-20 DIAGNOSIS — S52122A Displaced fracture of head of left radius, initial encounter for closed fracture: Secondary | ICD-10-CM | POA: Insufficient documentation

## 2020-12-20 DIAGNOSIS — S52501A Unspecified fracture of the lower end of right radius, initial encounter for closed fracture: Secondary | ICD-10-CM

## 2020-12-20 DIAGNOSIS — S52602A Unspecified fracture of lower end of left ulna, initial encounter for closed fracture: Secondary | ICD-10-CM | POA: Insufficient documentation

## 2020-12-20 DIAGNOSIS — W010XXA Fall on same level from slipping, tripping and stumbling without subsequent striking against object, initial encounter: Secondary | ICD-10-CM | POA: Diagnosis not present

## 2020-12-20 DIAGNOSIS — Z79899 Other long term (current) drug therapy: Secondary | ICD-10-CM | POA: Diagnosis not present

## 2020-12-20 DIAGNOSIS — Z7982 Long term (current) use of aspirin: Secondary | ICD-10-CM | POA: Insufficient documentation

## 2020-12-20 DIAGNOSIS — M7989 Other specified soft tissue disorders: Secondary | ICD-10-CM | POA: Diagnosis not present

## 2020-12-20 MED ORDER — OXYCODONE-ACETAMINOPHEN 5-325 MG PO TABS
2.0000 | ORAL_TABLET | Freq: Once | ORAL | Status: AC
  Administered 2020-12-20: 2 via ORAL
  Filled 2020-12-20: qty 2

## 2020-12-20 NOTE — Discharge Instructions (Signed)
You have a wrist fracture.  Please keep the splint on until you see orthopedic doctor.  Apply ice to help with swelling  Dr. Biagio Borg office will contact you on Monday or Tuesday for appointment later in the week  You should continue taking your oxycodone as prescribed by your doctor.  If you require more and run out early, you will need to talk to your doctor about this  Return to ER if you have severe pain, fingers turning blue

## 2020-12-20 NOTE — ED Provider Notes (Signed)
[facility] EMERGENCY DEPT Provider Note   CSN: [id number] Arrival date & time: 12/20/20  1628     History Chief Complaint  Patient presents with   Lytle Michaels    Linda Kent is a 75 y.o. female hx of HL, who presented with fall.  Patient states that she took her sock off and she went into the bathroom and slipped and fell onto her outstretched hand.  Patient states that she noticed obvious deformity in the left wrist area.  Denies any head injury or other injuries.  Patient states that she is on chronic pain medicine.  She has a pain contract with her doctor.  The history is provided by the patient.      Past Medical History:  Diagnosis Date   Acne rosacea    ADD (attention deficit disorder)    Back pain    Chronic insomnia    Colon polyps    Depression    Hyperlipidemia    Hypothyroidism    sees Dr. Elmarie Shiley    IBS (irritable bowel syndrome)    Migraines    sees Dr. Orie Rout    Myofascial pain syndrome    sees Dr. Elta Guadeloupe Philips    Osteoporosis    sees Dr. Elmarie Shiley    Post menopausal syndrome    Recurrent cystitis    after intercourse, controlled with macrobid    Routine gynecological examination    sees [facility]     Patient Active Problem List   Diagnosis Date Noted   Statin intolerance 08/30/2017   [place] cardiac risk 10-20% in next 10 years 08/30/2017   ADHD 06/14/2017   Myofascial pain syndrome 07/17/2012   MIXED HYPERLIPIDEMIA 11/25/2009   LEUKOPENIA, MILD 11/25/2009   IRRITABLE BOWEL SYNDROME 11/25/2009   Hypothyroidism 10/29/2008   Anxiety state 06/20/2008   ARTHRITIS 04/16/2008   Migraine headache 09/26/2007   ALLERGIC RHINITIS 09/26/2007   ROSACEA 09/26/2007   Osteoporosis 09/26/2007   INSOMNIA, CHRONIC 09/22/2006   POSTMENOPAUSAL STATUS 09/22/2006   DIARRHEA, ACUTE, CHRONIC 09/22/2006   Depression 09/22/2006   Headache 09/15/2006    Past Surgical History:  Procedure Laterality Date   ABDOMINAL HYSTERECTOMY      parital for prolapse   APPENDECTOMY     COLONOSCOPY  2017   per Dr. Amedeo Plenty, adenomatous polyps, repeat in 5 yrs    OVARIAN CYST REMOVAL     excised on left    TONSILLECTOMY       OB History   No obstetric history on file.     Family History  Problem Relation Age of Onset   Other Brother        low serotonin levels    Other Sister        low serotonin levels   Colon polyps Sister    Cancer Other 62       puncle decreased from colon cancer     Social History   Tobacco Use   Smoking status: Never   Smokeless tobacco: Never  Substance Use Topics   Alcohol use: No    Alcohol/week: 0.0 standard drinks   Drug use: No    Home Medications Prior to Admission medications   Medication Sig Start Date End Date Taking? Authorizing Provider  Ascorbic Acid (VITAMIN C) 1000 MG tablet Take 1,000 mg by mouth 2 (two) times daily.     [provider]  aspirin 81 MG EC tablet Take 81 mg by mouth daily.    [provider]  Calcium 1500 MG tablet Take 1,500 mg by mouth daily. With Vitamin D    [provider]  denosumab (PROLIA) 60 MG/ML SOLN injection Inject 60 mg into the skin every 6 (six) months. Administer in upper arm, thigh, or abdomen    [provider]  halobetasol (ULTRAVATE) 0.05 % cream Apply topically 2 (two) times daily. 08/13/20   Laurey Morale, MD  HYDROcodone-acetaminophen (NORCO) 10-325 MG tablet Take 1 tablet by mouth every 6 (six) hours as needed. 12/07/20 01/06/21  Laurey Morale, MD  levothyroxine (SYNTHROID, LEVOTHROID) 100 MCG tablet Take 100 mcg by mouth daily. Brand name only     [provider]  LORazepam (ATIVAN) 2 MG tablet Take 1 tablet (2 mg total) by mouth 3 (three) times daily. 08/13/20   Laurey Morale, MD  meclizine (ANTIVERT) 25 MG tablet Take 1 tablet (25 mg total) by mouth every 4 (four) hours as needed for dizziness. 08/13/20   Laurey Morale, MD  Melatonin 3 MG CAPS Take 9 mg by mouth at bedtime.    [provider]  methylphenidate (RITALIN) 10 MG tablet Take 1 tablet (10 mg total) by mouth in the morning, at noon, in the evening, and at bedtime. 12/07/20 01/06/21  Laurey Morale, MD  metroNIDAZOLE (METROCREAM) 0.75 % cream Apply 1 application topically daily. 08/13/20   Laurey Morale, MD  Multiple Vitamin (MULTIVITAMIN) tablet Take 1 tablet by mouth daily. Centrum 50 +    [provider]  NON FORMULARY     [provider]  NONFORMULARY OR COMPOUNDED ITEM APPLY A DIME SIZED AMOUNT TO AFFECTED AREA 4 TIMES DAILY AS NEEDED. 08/13/20   Laurey Morale, MD  Oxycodone HCl 20 MG TABS Take 1 tablet (20 mg total) by mouth every 4 (four) hours as needed (pain). 12/07/20 01/06/21  Laurey Morale, MD  perphenazine (TRILAFON) 4 MG tablet Take 1 tablet (4 mg total) by mouth every 4 (four) hours as needed (headaches). For migraines 08/13/20   Laurey Morale, MD  Probiotic Product (ALIGN PO) Take 1 capsule by mouth daily.     [provider]  rizatriptan (MAXALT) 10 MG tablet Take 1 tablet (10 mg total) by mouth as needed for migraine. 09/16/20   Laurey Morale, MD  topiramate (TOPAMAX) 50 MG tablet TAKE 3 TABLETS BY MOUTH EVERY DAY 09/17/20   Laurey Morale, MD  zaleplon (SONATA) 10 MG capsule Take 1 capsule (10 mg total) by mouth at bedtime as needed. for sleep 08/13/20   Laurey Morale, MD    Allergies    Ezetimibe, Atorvastatin calcium [atorvastatin], Crestor [rosuvastatin calcium], and Cephalexin  Review of Systems   Review of Systems  Musculoskeletal:        L hand pain   All other systems reviewed and are negative.  Physical Exam Updated Vital Signs BP 134/69   Pulse (!) 128   Temp 97.6 F (36.4 C)   Resp 16   Ht 5' 4.5" (1.638 m)   Wt 67.9 kg   SpO2 99%   BMI 25.30 kg/m   Physical Exam Vitals and nursing note reviewed.  Constitutional:      Appearance: Normal appearance.  HENT:     Head: Normocephalic and atraumatic.     Nose: Nose normal.      Mouth/Throat:     Mouth: Mucous membranes are moist.  Eyes:     Extraocular Movements: Extraocular movements intact.     Pupils: Pupils  are equal, round, and reactive to light.  Cardiovascular:     Rate and Rhythm: Normal rate and regular rhythm.     Pulses: Normal pulses.     Heart sounds: Normal heart sounds.  Pulmonary:     Effort: Pulmonary effort is normal.     Breath sounds: Normal breath sounds.  Abdominal:     General: Abdomen is flat.     Palpations: Abdomen is soft.  Musculoskeletal:     Cervical back: Normal range of motion and neck supple.     Comments: Left wrist with some swelling and ecchymosis.  She is able to range the wrist.  No obvious finger deformities  Skin:    General: Skin is warm.     Capillary Refill: Capillary refill takes less than 2 seconds.  Neurological:     General: No focal deficit present.     Mental Status: She is alert and oriented to person, place, and time.  Psychiatric:        Mood and Affect: Mood normal.        Behavior: Behavior normal.    ED Results / Procedures / Treatments   Labs (all labs ordered are listed, but only abnormal results are displayed) Labs Reviewed - No data to display  EKG None  Radiology DG Wrist Complete Left  Result Date: 12/20/2020 CLINICAL DATA:  Injury.  Moderate swelling. EXAM: LEFT WRIST - COMPLETE 3+ VIEW COMPARISON:  None. FINDINGS: There is a transverse moderately displaced impacted fracture of the distal left radius with possible intra-articular extension to the radiocarpal joint. There is an associated transverse minimally displaced fracture of the left ulnar styloid process. Associated soft tissue swelling. The carpal rows are normally aligned. IMPRESSION: 1. Transverse moderately displaced impacted fracture of the distal left radius with possible intra-articular extension to the radiocarpal joint. 2. Minimally displaced transverse fracture of the left ulnar styloid process. Electronically Signed   By:  Fidela Salisbury M.D.   On: 12/20/2020 17:37   DG Hand Complete Left  Result Date: 12/20/2020 CLINICAL DATA:  Fall; Moderate Swelling EXAM: LEFT HAND - COMPLETE 3+ VIEW COMPARISON:  None. FINDINGS: Diffusely decreased bone density. There is no evidence of fracture or dislocation. There is no evidence of arthropathy or other focal bone abnormality. Subcutaneus soft tissue edema of the wrist Distal ulnar and radial fracture noted. Please see separately dictated x-ray left wrist 12/20/2020. IMPRESSION: 1. No acute displaced fracture or dislocation of the bones of the left hand. 2. Distal ulnar and radial fracture noted. Please see separately dictated x-ray left wrist 12/20/2020. Electronically Signed   By: Iven Finn M.D.   On: 12/20/2020 17:41    Procedures Procedures   Medications Ordered in ED Medications  oxyCODONE-acetaminophen (PERCOCET/ROXICET) 5-325 MG per tablet 2 tablet (has no administration in time range)    ED Course  I have reviewed the triage vital signs and the nursing notes.  Pertinent labs & imaging results that were available during my care of the patient were reviewed by me and considered in my medical decision making (see chart for details).    MDM Rules/Calculators/A&P                           AYDIN CAVALIERI is a 75 y.o. female here presenting with fall onto outstretched hand.  Patient has obvious left wrist deformity.  Patient is neurovascular intact.  X-ray showed distal radius and ulna fractures.  Patient was given Percocet  in the ED.  Patient states that she is on pain medicine at baseline and has oxycodone at home.  I discussed case with Dr. Grandville Silos from hand surgery.  He states that his office will call patient on Monday for appointment later that week.  Will place in sugar-tong splint   Final Clinical Impression(s) / ED Diagnoses Final diagnoses:  None    Rx / DC Orders ED Discharge Orders     None        Drenda Freeze, MD 12/20/20  1904

## 2020-12-20 NOTE — ED Triage Notes (Signed)
Patient here POV from Home for Fall.  Patient states she took her Sock off because it was too tight and so when she went into the BR she slipped.  Patient complaining of Pain to Left Hand and Wrist. Soreness to Left Side of Body. No Head Injury  NAD Noted during Triage. A&Ox4. GCS 15. BIB Wheelchair. No History of Blood Thinning Medications.

## 2020-12-25 DIAGNOSIS — S52572A Other intraarticular fracture of lower end of left radius, initial encounter for closed fracture: Secondary | ICD-10-CM | POA: Diagnosis not present

## 2021-01-01 DIAGNOSIS — S52572A Other intraarticular fracture of lower end of left radius, initial encounter for closed fracture: Secondary | ICD-10-CM | POA: Diagnosis not present

## 2021-01-07 DIAGNOSIS — S52572D Other intraarticular fracture of lower end of left radius, subsequent encounter for closed fracture with routine healing: Secondary | ICD-10-CM | POA: Diagnosis not present

## 2021-01-08 DIAGNOSIS — M810 Age-related osteoporosis without current pathological fracture: Secondary | ICD-10-CM | POA: Diagnosis not present

## 2021-01-14 ENCOUNTER — Telehealth (INDEPENDENT_AMBULATORY_CARE_PROVIDER_SITE_OTHER): Payer: Medicare Other | Admitting: Family Medicine

## 2021-01-14 ENCOUNTER — Encounter: Payer: Self-pay | Admitting: Family Medicine

## 2021-01-14 DIAGNOSIS — M7918 Myalgia, other site: Secondary | ICD-10-CM | POA: Diagnosis not present

## 2021-01-14 DIAGNOSIS — F1190 Opioid use, unspecified, uncomplicated: Secondary | ICD-10-CM

## 2021-01-14 MED ORDER — RIZATRIPTAN BENZOATE 10 MG PO TABS
10.0000 mg | ORAL_TABLET | ORAL | 11 refills | Status: DC | PRN
Start: 2021-01-14 — End: 2022-02-01

## 2021-01-14 MED ORDER — METHYLPHENIDATE HCL 10 MG PO TABS: 10.0000 mg | ORAL_TABLET | Freq: Four times a day (QID) | ORAL | 0 refills | Status: DC

## 2021-01-14 MED ORDER — OXYCODONE HCL 20 MG PO TABS: 1.0000 | ORAL_TABLET | ORAL | 0 refills | Status: DC | PRN

## 2021-01-14 MED ORDER — HYDROCODONE-ACETAMINOPHEN 10-325 MG PO TABS: 1.0000 | ORAL_TABLET | Freq: Four times a day (QID) | ORAL | 0 refills | Status: DC | PRN

## 2021-01-14 MED ORDER — LORAZEPAM 2 MG PO TABS: 2.0000 mg | ORAL_TABLET | Freq: Three times a day (TID) | ORAL | 5 refills | Status: DC

## 2021-01-14 MED ORDER — ZALEPLON 10 MG PO CAPS: 10.0000 mg | ORAL_CAPSULE | Freq: Every evening | ORAL | 1 refills | Status: DC | PRN

## 2021-01-14 NOTE — Progress Notes (Signed)
° °  Subjective:    Patient ID: Linda Kent, female    DOB: August 19, 1945, 75 y.o.   MRN: [id number]  HPI Virtual Visit via Telephone Note  I connected with the patient on 01/14/21 at  1:00 PM EST by telephone and verified that I am speaking with the correct person using two identifiers.   I discussed the limitations, risks, security and privacy concerns of performing an evaluation and management service by telephone and the availability of in person appointments. I also discussed with the patient that there may be a patient responsible charge related to this service. The patient expressed understanding and agreed to proceed.  Location patient: home Location provider: work or home office Participants present for the call: patient, provider Patient did not have a visit in the prior 7 days to address this/these issue(s).   History of Present Illness: Here for pain management, she is doing well.    Observations/Objective: Patient sounds cheerful and well on the phone. I do not appreciate any SOB. Speech and thought processing are grossly intact. Patient reported vitals:  Assessment and Plan: Pain management. Indication for chronic opioid: myofascial pain Medication and dose: Norco 10-325 and Oxycodone 20 mg # pills per month: 120 and 180 Last UDS date: 08-13-20 Opioid Treatment Agreement signed (Y/N): 04-06-17 Opioid Treatment Agreement last reviewed with patient:  01-14-21 [facility] reviewed this encounter (include red flags): Yes Meds were refilled.  Alysia Penna, MD   Follow Up Instructions:     [phone] 5-10 [phone] 11-20 [id number] 21-30 I did not refer this patient for an OV in the next 24 hours for this/these issue(s).  I discussed the assessment and treatment plan with the patient. The patient was provided an opportunity to ask questions and all were answered. The patient agreed with the plan and demonstrated an understanding of the instructions.   The patient was advised to call  back or seek an in-person evaluation if the symptoms worsen or if the condition fails to improve as anticipated.  I provided 17 minutes of non-face-to-face time during this encounter.   Alysia Penna, MD     Review of Systems     Objective:   Physical Exam        Assessment & Plan:

## 2021-01-30 ENCOUNTER — Encounter: Payer: Self-pay | Admitting: Family Medicine

## 2021-02-03 DIAGNOSIS — S52572D Other intraarticular fracture of lower end of left radius, subsequent encounter for closed fracture with routine healing: Secondary | ICD-10-CM | POA: Diagnosis not present

## 2021-03-04 ENCOUNTER — Encounter: Payer: Self-pay | Admitting: Gastroenterology

## 2021-03-05 ENCOUNTER — Telehealth: Payer: Self-pay | Admitting: Family Medicine

## 2021-03-05 NOTE — Telephone Encounter (Signed)
Patient stated that she have IVS and she have had very little problems with it. Patient stated that the past 3 months she's been having diarrhea problems. She stated that today was the worst. She wants to know if Dr.Fry could get her in somewhere else sooner because GI couldn't get her in until 03/09.  Patient could be contacted at [phone].  Please advise.

## 2021-03-05 NOTE — Telephone Encounter (Signed)
Spoke with patient she is experiencing severe diarrhea problems, on today she has had 4 or 5 loose stools, and she is currently having to wear an undergarment, not to mess up   Patient is requesting prescription to be sent to pharmacy to help with this situation.   Please advise

## 2021-03-06 MED ORDER — DIPHENOXYLATE-ATROPINE 2.5-0.025 MG PO TABS: 2.0000 | ORAL_TABLET | Freq: Four times a day (QID) | ORAL | 0 refills | Status: DC | PRN

## 2021-03-06 NOTE — Telephone Encounter (Signed)
I doubt she would get in with another office any sooner than March 9, so keep that appt. In the meantime I called in some Lomotil which will help control the diarrhea

## 2021-03-09 NOTE — Telephone Encounter (Signed)
Called patient, spoke with husband Glendell Docker.   Husband said patient diarrhea had eased up somewhat and she is feeling a little better.  She started the Lomotil on yesterday.    Glendell Docker said a huge Thank You to Dr. Sarajane Jews.

## 2021-03-09 NOTE — Telephone Encounter (Signed)
Sounds good

## 2021-03-16 ENCOUNTER — Ambulatory Visit: Payer: Medicare Other | Admitting: Family Medicine

## 2021-03-26 ENCOUNTER — Other Ambulatory Visit (INDEPENDENT_AMBULATORY_CARE_PROVIDER_SITE_OTHER): Payer: Medicare Other

## 2021-03-26 ENCOUNTER — Encounter: Payer: Self-pay | Admitting: Gastroenterology

## 2021-03-26 ENCOUNTER — Ambulatory Visit (INDEPENDENT_AMBULATORY_CARE_PROVIDER_SITE_OTHER): Payer: Medicare Other | Admitting: Gastroenterology

## 2021-03-26 VITALS — BP 92/68 | HR 52 | Ht 64.0 in | Wt 135.4 lb

## 2021-03-26 DIAGNOSIS — K580 Irritable bowel syndrome with diarrhea: Secondary | ICD-10-CM

## 2021-03-26 DIAGNOSIS — Z1212 Encounter for screening for malignant neoplasm of rectum: Secondary | ICD-10-CM

## 2021-03-26 DIAGNOSIS — Z1211 Encounter for screening for malignant neoplasm of colon: Secondary | ICD-10-CM | POA: Diagnosis not present

## 2021-03-26 DIAGNOSIS — R197 Diarrhea, unspecified: Secondary | ICD-10-CM

## 2021-03-26 LAB — CBC WITH DIFFERENTIAL/PLATELET
Basophils Absolute: 0.1 10*3/uL (ref 0.0–0.1)
Basophils Relative: 1.1 % (ref 0.0–3.0)
Eosinophils Absolute: 0.1 10*3/uL (ref 0.0–0.7)
Eosinophils Relative: 1.3 % (ref 0.0–5.0)
HCT: 44.4 % (ref 36.0–46.0)
Hemoglobin: 14.6 g/dL (ref 12.0–15.0)
Lymphocytes Relative: 26 % (ref 12.0–46.0)
Lymphs Abs: 1.7 10*3/uL (ref 0.7–4.0)
MCHC: 32.8 g/dL (ref 30.0–36.0)
MCV: 97.1 fl (ref 78.0–100.0)
Monocytes Absolute: 0.4 10*3/uL (ref 0.1–1.0)
Monocytes Relative: 6.7 % (ref 3.0–12.0)
Neutro Abs: 4.2 10*3/uL (ref 1.4–7.7)
Neutrophils Relative %: 64.9 % (ref 43.0–77.0)
Platelets: 265 10*3/uL (ref 150.0–400.0)
RBC: 4.57 Mil/uL (ref 3.87–5.11)
RDW: 13.2 % (ref 11.5–15.5)
WBC: 6.4 10*3/uL (ref 4.0–10.5)

## 2021-03-26 LAB — COMPREHENSIVE METABOLIC PANEL
ALT: 10 U/L (ref 0–35)
AST: 20 U/L (ref 0–37)
Albumin: 3.8 g/dL (ref 3.5–5.2)
Alkaline Phosphatase: 86 U/L (ref 39–117)
BUN: 9 mg/dL (ref 6–23)
CO2: 26 mEq/L (ref 19–32)
Calcium: 9.5 mg/dL (ref 8.4–10.5)
Chloride: 104 mEq/L (ref 96–112)
Creatinine, Ser: 0.85 mg/dL (ref 0.40–1.20)
GFR: 66.02 mL/min (ref >60.00)
Glucose, Bld: 113 mg/dL — ABNORMAL HIGH (ref 70–99)
Potassium: 4.8 mEq/L (ref 3.5–5.1)
Sodium: 137 mEq/L (ref 135–145)
Total Bilirubin: 0.4 mg/dL (ref 0.2–1.2)
Total Protein: 6.7 g/dL (ref 6.0–8.3)

## 2021-03-26 LAB — IBC PANEL
Iron: 77 ug/dL (ref 42–145)
Saturation Ratios: 26.8 % (ref 20.0–50.0)
TIBC: 287 ug/dL (ref 250.0–450.0)
Transferrin: 205 mg/dL — ABNORMAL LOW (ref 212.0–360.0)

## 2021-03-26 LAB — C-REACTIVE PROTEIN: CRP: <1 mg/dL (ref 0.5–20.0)

## 2021-03-26 LAB — FERRITIN: Ferritin: 109 ng/mL (ref 10.0–291.0)

## 2021-03-26 LAB — TSH: TSH: 0.15 u[IU]/mL — ABNORMAL LOW (ref 0.35–5.50)

## 2021-03-26 MED ORDER — CLENPIQ 10-3.5-12 MG-GM -GM/160ML PO SOLN: 320.0000 mL | Freq: Once | ORAL | 0 refills | Status: AC

## 2021-03-26 NOTE — Patient Instructions (Signed)
If you are age 78 or older, your body mass index should be between 23-30. Your Body mass index is 23.25 kg/m?Marland Kitchen If this is out of the aforementioned range listed, please consider follow up with your Primary Care Provider. ? ?If you are age 20 or younger, your body mass index should be between 19-25. Your Body mass index is 23.25 kg/m?Marland Kitchen If this is out of the aformentioned range listed, please consider follow up with your Primary Care Provider.  ? ?You have been scheduled for a colonoscopy. Please follow written instructions given to you at your visit today.  ?Please pick up your prep supplies at the pharmacy within the next 1-3 days. ?If you use inhalers (even only as needed), please bring them with you on the day of your procedure. ? ?Your provider has requested that you go to the basement level for lab work before leaving today. Press "B" on the elevator. The lab is located at the first door on the left as you exit the elevator. ? ?The [facility] providers would like to encourage you to use [facility] to communicate with providers for non-urgent requests or questions.  Due to long hold times on the telephone, sending your provider a message by [facility] may be a faster and more efficient way to get a response.  Please allow 48 business hours for a response.  Please remember that this is for non-urgent requests.  ? ?Due to recent changes in healthcare laws, you may see the results of your imaging and laboratory studies on MyChart before your provider has had a chance to review them.  We understand that in some cases there may be results that are confusing or concerning to you. Not all laboratory results come back in the same time frame and the provider may be waiting for multiple results in order to interpret others.  Please give [place] 48 hours in order for your provider to thoroughly review all the results before contacting the office for clarification of your results.   ? ?It was a pleasure to see you today! ? ?Thank you  for trusting me with your gastrointestinal care!   ? ?Scott E.Candis Schatz, MD  ? ?

## 2021-03-26 NOTE — Progress Notes (Signed)
HPI : Linda Kent is pleasant 76 year old female with a history of depression, ADHD, IBS-M and chronic pain/myofascial pain syndrome on chronic narcotics who is referred to [place] by Dr. Alysia Penna for further evaluation of diarrhea.  The patient states that she has been having significant diarrhea for the last 2-3 months.  Although she has had problems with intermittent constipation for a long time, she has not had diarrhea like this for over 20 years.  She thinks her symptoms may have started after she took some over-the-counter diet pills.  She took 2 or 3 of these pills which is supposed to reduce her appetite.  She bought them from [place], but does not remember what was in them or what the name of the product was.  She has had persistently watery stools now for at least 2 months.  She has had no solid stool.  She typically has 2-3 bowel movements per day.  Almost always she has a bowel movement upon awakening.  She has significant urgency as well as urge incontinence.  No nocturnal bowel movements and no nocturnal incontinence. She has mild crampy abdominal pain, but denies nausea or vomiting.  No blood in the stool.  She will have days where she does not have a bowel movement, but she denies any solid stools or straining.  No tenesmus.  No fevers or chills.  She reports decreased p.o. intake because eating makes her diarrhea worse.  As result, she has lost about 12 pounds over the past few months.  She was prescribed Lomotil which she said did not help at all.  She states that she took 2 tablets 4 times a day and did not improve her stool consistency or reduce frequency. She denies any recent antibiotic usage.  No recent travel.  No sick contacts.  She lives with her husband, who does not have any similar symptoms.  Her last colonoscopy was in 2008 and was incomplete secondary to a very tortuous colon.  2 tubular adenomas were removed.  Random colon biopsies were negative for microscopic colitis.  The  colonoscope was advanced to the hepatic flexure before the procedure was aborted.  She apparently underwent a barium enema following that.  She has not had a repeat attempt at colonoscopy since then.  Past Medical History:  Diagnosis Date   Acne rosacea    ADD (attention deficit disorder)    Back pain    Chronic insomnia    Colon polyps    Depression    Hyperlipidemia    Hypothyroidism    sees Dr. Elmarie Shiley    IBS (irritable bowel syndrome)    Migraines    sees Dr. Orie Rout    Myofascial pain syndrome    sees Dr. Elta Guadeloupe Philips    Osteoporosis    sees Dr. Elmarie Shiley    Post menopausal syndrome    Recurrent cystitis    after intercourse, controlled with macrobid    Routine gynecological examination    sees [facility]      Past Surgical History:  Procedure Laterality Date   ABDOMINAL HYSTERECTOMY     [facility] for prolapse   APPENDECTOMY     COLONOSCOPY  2017   per Dr. Amedeo Plenty, adenomatous polyps, repeat in 5 yrs    OVARIAN CYST REMOVAL     excised on left    TONSILLECTOMY     Family History  Problem Relation Age of Onset   Other Brother  low serotonin levels    Other Sister        low serotonin levels   Colon polyps Sister    Cancer Other 78       puncle decreased from colon cancer    Social History   Tobacco Use   Smoking status: Never   Smokeless tobacco: Never  Substance Use Topics   Alcohol use: No    Alcohol/week: 0.0 standard drinks   Drug use: No   Current Outpatient Medications  Medication Sig Dispense Refill   Ascorbic Acid (VITAMIN C) 1000 MG tablet Take 1,000 mg by mouth 2 (two) times daily.      aspirin 81 MG EC tablet Take 81 mg by mouth daily.     Calcium 1500 MG tablet Take 1,500 mg by mouth daily. With Vitamin D     denosumab (PROLIA) 60 MG/ML SOLN injection Inject 60 mg into the skin every 6 (six) months. Administer in upper arm, thigh, or abdomen     diphenoxylate-atropine (LOMOTIL) 2.5-0.025 MG tablet Take 2 tablets by  mouth 4 (four) times daily as needed for diarrhea or loose stools. 120 tablet 0   halobetasol (ULTRAVATE) 0.05 % cream Apply topically 2 (two) times daily. 50 g 5   HYDROcodone-acetaminophen (NORCO) 10-325 MG tablet Take 1 tablet by mouth every 6 (six) hours as needed for moderate pain. 120 tablet 0   levothyroxine (SYNTHROID, LEVOTHROID) 100 MCG tablet Take 100 mcg by mouth daily. Brand name only      LORazepam (ATIVAN) 2 MG tablet Take 1 tablet (2 mg total) by mouth 3 (three) times daily. 90 tablet 5   meclizine (ANTIVERT) 25 MG tablet Take 1 tablet (25 mg total) by mouth every 4 (four) hours as needed for dizziness. 30 tablet 5   Melatonin 3 MG CAPS Take 9 mg by mouth at bedtime.     methylphenidate (RITALIN) 10 MG tablet Take 1 tablet (10 mg total) by mouth in the morning, at noon, in the evening, and at bedtime. 120 tablet 0   metroNIDAZOLE (METROCREAM) 0.75 % cream Apply 1 application topically daily. 180 g 3   Multiple Vitamin (MULTIVITAMIN) tablet Take 1 tablet by mouth daily. Centrum 50 +     NON FORMULARY      NONFORMULARY OR COMPOUNDED ITEM APPLY A DIME SIZED AMOUNT TO AFFECTED AREA 4 TIMES DAILY AS NEEDED. 1 each 6   Oxycodone HCl 20 MG TABS Take 1 tablet (20 mg total) by mouth every 4 (four) hours as needed (pain). 180 tablet 0   perphenazine (TRILAFON) 4 MG tablet Take 1 tablet (4 mg total) by mouth every 4 (four) hours as needed (headaches). For migraines 100 tablet 0   Probiotic Product (ALIGN PO) Take 1 capsule by mouth daily.      rizatriptan (MAXALT) 10 MG tablet Take 1 tablet (10 mg total) by mouth as needed for migraine. 10 tablet 11   topiramate (TOPAMAX) 50 MG tablet TAKE 3 TABLETS BY MOUTH EVERY DAY 270 tablet 3   zaleplon (SONATA) 10 MG capsule Take 1 capsule (10 mg total) by mouth at bedtime as needed. for sleep 180 capsule 1   No current facility-administered medications for this visit.   Allergies  Allergen Reactions   Ezetimibe Other (See Comments)    Difficulty  breathing   Atorvastatin Calcium [Atorvastatin]     Caused chest pain, labored breathing, fatigue    Crestor [Rosuvastatin Calcium] Other (See Comments)    Chest pain, labored breathing  Cephalexin Rash     Review of Systems: All systems reviewed and negative except where noted in HPI.    No results found.  Physical Exam: Pulse (!) 52    Ht '5\' 4"'$  (1.626 m)    Wt 135 lb 7 oz (61.4 kg)    SpO2 92%    BMI 23.25 kg/m  Constitutional: Pleasant,well-developed, Caucasian female in no acute distress.  Seated in wheelchair HEENT: Normocephalic and atraumatic. Conjunctivae are normal. No scleral icterus. Cardiovascular: Normal rate, regular rhythm.  Pulmonary/chest: Effort normal and breath sounds normal. No wheezing, rales or rhonchi. Abdominal: Soft, nondistended, nontender. Bowel sounds active throughout. There are no masses palpable. No hepatomegaly. Extremities: no edema Neurological: Alert and oriented to person place and time. Skin: Skin is warm and dry. No rashes noted. Psychiatric: Normal mood and affect. Behavior is normal.  CBC    Component Value Date/Time   WBC 8.1 08/13/2020 0948   RBC 4.34 08/13/2020 0948   HGB 14.1 08/13/2020 0948   HCT 42.6 08/13/2020 0948   PLT 304.0 08/13/2020 0948   MCV 98.2 08/13/2020 0948   MCH 32.6 08/13/2011 1527   MCHC 33.0 08/13/2020 0948   RDW 12.8 08/13/2020 0948   LYMPHSABS 3.2 08/13/2020 0948   MONOABS 0.6 08/13/2020 0948   EOSABS 0.3 08/13/2020 0948   BASOSABS 0.0 08/13/2020 0948    CMP     Component Value Date/Time   NA 136 08/13/2020 0948   K 4.0 08/13/2020 0948   CL 102 08/13/2020 0948   CO2 27 08/13/2020 0948   GLUCOSE 94 08/13/2020 0948   BUN 11 08/13/2020 0948   CREATININE 0.81 08/13/2020 0948   CALCIUM 8.9 08/13/2020 0948   PROT 6.5 08/13/2020 0948   ALBUMIN 3.8 08/13/2020 0948   AST 22 08/13/2020 0948   ALT 18 08/13/2020 0948   ALKPHOS 92 08/13/2020 0948   BILITOT 0.4 08/13/2020 0948   GFRNONAA 85 (L)  08/13/2011 1527   GFRAA >90 08/13/2011 1527     ASSESSMENT AND PLAN: 76 year old female with history of IBS, with 2 to 3 months of persistent watery diarrhea, with no solid stool, with associated decreased p.o. intake and 12 pound unintentional weight loss.  She has urgency with incontinence but no bloody stool or tenesmus.  Symptoms not improved with Lomotil. Diarrhea does not seem consistent with typical IBS given the unintentional weight loss and lack of improvement with Lomotil.  Strong suspicion for microscopic colitis, even though patient had negative biopsies in the past.  C. difficile also a consideration although she does not have any risk factors for it. We will check CBC, CMP, CRP, calprotectin, GI PCR pathogen stool panel, TSH, TTG/IgA.  We will screen for B12 and iron deficiency. We will also schedule patient for colonoscopy, both for colon cancer screening as well as to potentially exclude microscopic colitis (assuming pathogen panel negative).   Diarrhea -CBC, CMP, CRP -Calprotectin, GI PCR stool panel -TSH, TTG/IgA,  -B12/folate, Iron -Colonoscopy to exclude microscopic colitis - If C. difficile/pathogen panel negative, we will try treating with rifaximin  Colon cancer screening/polyp surveillance - Colonoscopy as above  The details, risks (including bleeding, perforation, infection, missed lesions, medication reactions and possible hospitalization or surgery if complications occur), benefits, and alternatives to colonoscopy with possible biopsy and possible polypectomy were discussed with the patient and she consents to proceed.   Kyair Ditommaso E. Candis Schatz, MD [facility]  CC:  Laurey Morale, MD

## 2021-03-27 LAB — TISSUE TRANSGLUTAMINASE, IGA: (tTG) Ab, IgA: <1 U/mL

## 2021-03-27 LAB — IGA: Immunoglobulin A: 205 mg/dL (ref 70–320)

## 2021-03-28 ENCOUNTER — Encounter: Payer: Self-pay | Admitting: Gastroenterology

## 2021-04-07 ENCOUNTER — Other Ambulatory Visit: Payer: Self-pay

## 2021-04-07 ENCOUNTER — Encounter: Payer: Self-pay | Admitting: Gastroenterology

## 2021-04-07 ENCOUNTER — Other Ambulatory Visit: Payer: Medicare Other

## 2021-04-07 ENCOUNTER — Ambulatory Visit (AMBULATORY_SURGERY_CENTER): Payer: Medicare Other | Admitting: Gastroenterology

## 2021-04-07 VITALS — BP 122/65 | HR 74 | Temp 97.8°F | Resp 16 | Ht 64.0 in | Wt 135.0 lb

## 2021-04-07 DIAGNOSIS — R197 Diarrhea, unspecified: Secondary | ICD-10-CM

## 2021-04-07 DIAGNOSIS — Z1211 Encounter for screening for malignant neoplasm of colon: Secondary | ICD-10-CM | POA: Diagnosis not present

## 2021-04-07 DIAGNOSIS — D125 Benign neoplasm of sigmoid colon: Secondary | ICD-10-CM

## 2021-04-07 DIAGNOSIS — Z1212 Encounter for screening for malignant neoplasm of rectum: Secondary | ICD-10-CM | POA: Diagnosis not present

## 2021-04-07 HISTORY — PX: COLONOSCOPY: SHX174

## 2021-04-07 MED ORDER — SODIUM CHLORIDE 0.9 % IV SOLN: 500.0000 mL | Freq: Once | INTRAVENOUS | Status: DC

## 2021-04-07 NOTE — Progress Notes (Signed)
Sedate, gd SR, tolerated procedure well, VSS, report to RN 

## 2021-04-07 NOTE — Progress Notes (Signed)
History and Physical Interval Note: ? ?04/07/2021 ?3:58 PM ? ?Linda Kent  has presented today for endoscopic procedure(s), with the diagnosis of  ?Encounter Diagnosis  ?Name Primary?  ? Diarrhea, unspecified type Yes  ?Marland Kitchen  The various methods of evaluation and treatment have been discussed with the patient and/or family. After consideration of risks, benefits and other options for treatment, the patient has consented to  the endoscopic procedure(s). ? ? The patient's history has been reviewed, patient examined, no change in status, stable for endoscopic procedure(s).  I have reviewed the patient's chart and labs.  Questions were answered to the patient's satisfaction.   ? ? ?Khamille Beynon E. Candis Schatz, MD ?[facility] ? ?

## 2021-04-07 NOTE — Patient Instructions (Signed)
Please read handouts provided. ?Continue present medications. ?Await pathology results. ?High Fiber Diet and Low Fat Diet. ? ? ?YOU HAD AN ENDOSCOPIC PROCEDURE TODAY AT [facility]:   Refer to the procedure report that was given to you for any specific questions about what was found during the examination.  If the procedure report does not answer your questions, please call your gastroenterologist to clarify.  If you requested that your care partner not be given the details of your procedure findings, then the procedure report has been included in a sealed envelope for you to review at your convenience later. ? ?YOU SHOULD EXPECT: Some feelings of bloating in the abdomen. Passage of more gas than usual.  Walking can help get rid of the air that was put into your GI tract during the procedure and reduce the bloating. If you had a lower endoscopy (such as a colonoscopy or flexible sigmoidoscopy) you may notice spotting of blood in your stool or on the toilet paper. If you underwent a bowel prep for your procedure, you may not have a normal bowel movement for a few days. ? ?Please Note:  You might notice some irritation and congestion in your nose or some drainage.  This is from the oxygen used during your procedure.  There is no need for concern and it should clear up in a day or so. ? ?SYMPTOMS TO REPORT IMMEDIATELY: ? ?Following lower endoscopy (colonoscopy or flexible sigmoidoscopy): ? Excessive amounts of blood in the stool ? Significant tenderness or worsening of abdominal pains ? Swelling of the abdomen that is new, acute ? Fever of 100?F or higher ? ? ?For urgent or emergent issues, a gastroenterologist can be reached at any hour by calling [phone]. ?Do not use MyChart messaging for urgent concerns.  ? ? ?DIET:  We do recommend a small meal at first, but then you may proceed to your regular diet.  Drink plenty of fluids but you should avoid alcoholic beverages for 24 hours. ? ?ACTIVITY:   You should plan to take it easy for the rest of today and you should NOT DRIVE or use heavy machinery until tomorrow (because of the sedation medicines used during the test).   ? ?FOLLOW UP: ?Our staff will call the number listed on your records 48-72 hours following your procedure to check on you and address any questions or concerns that you may have regarding the information given to you following your procedure. If we do not reach you, we will leave a message.  We will attempt to reach you two times.  During this call, we will ask if you have developed any symptoms of COVID 19. If you develop any symptoms (ie: fever, flu-like symptoms, shortness of breath, cough etc.) before then, please call [phone].  If you test positive for Covid 19 in the 2 weeks post procedure, please call and report this information to [place].   ? ?If any biopsies were taken you will be contacted by phone or by letter within the next 1-3 weeks.  Please call us at [phone] if you have not heard about the biopsies in 3 weeks.  ? ? ?SIGNATURES/CONFIDENTIALITY: ?You and/or your care partner have signed paperwork which will be entered into your electronic medical record.  These signatures attest to the fact that that the information above on your After Visit Summary has been reviewed and is understood.  Full responsibility of the confidentiality of this discharge information lies with you and/or your care-partner.  ?

## 2021-04-07 NOTE — Progress Notes (Signed)
Called to room to assist during endoscopic procedure.  Patient ID and intended procedure confirmed with present staff. Received instructions for my participation in the procedure from the performing physician.  

## 2021-04-07 NOTE — Op Note (Signed)
[place] ?Patient Name: Linda Kent ?Procedure Date: 04/07/2021 3:50 PM ?MRN: [id number] ?Endoscopist: Leonardo Makris E. Candis Schatz , MD ?Age: 76 ?Referring MD:  ?Date of Birth: 04/23/1945 ?Gender: Female ?Account #: [id number] ?Procedure:                Colonoscopy ?Indications:              Clinically significant diarrhea of unexplained  ?                          origin. Previous colonoscopy in 2008 incomplete due  ?                          to tortuosity ?Medicines:                Monitored Anesthesia Care ?Procedure:                Pre-Anesthesia Assessment: ?                          - Prior to the procedure, a History and Physical  ?                          was performed, and patient medications and  ?                          allergies were reviewed. The patient's tolerance of  ?                          previous anesthesia was also reviewed. The risks  ?                          and benefits of the procedure and the sedation  ?                          options and risks were discussed with the patient.  ?                          All questions were answered, and informed consent  ?                          was obtained. Prior Anticoagulants: The patient has  ?                          taken no previous anticoagulant or antiplatelet  ?                          agents. ASA Grade Assessment: II - A patient with  ?                          mild systemic disease. After reviewing the risks  ?                          and benefits, the patient was deemed in  ?  satisfactory condition to undergo the procedure. ?                          After obtaining informed consent, the colonoscope  ?                          was passed under direct vision. Throughout the  ?                          procedure, the patient's blood pressure, pulse, and  ?                          oxygen saturations were monitored continuously. The  ?                          Colonoscope was introduced through the anus  and  ?                          advanced to the the terminal ileum, with  ?                          identification of the appendiceal orifice and IC  ?                          valve. The colonoscopy was extremely difficult due  ?                          to significant looping and a tortuous colon.  ?                          Successful completion of the procedure was aided by  ?                          changing the patient to a supine position, changing  ?                          the patient to a prone position, using manual  ?                          pressure and withdrawing and reinserting the scope.  ?                          The patient tolerated the procedure well. The  ?                          quality of the bowel preparation was good. The  ?                          terminal ileum, ileocecal valve, appendiceal  ?                          orifice, and rectum were photographed. ?Scope In: 4:14:06 PM ?Scope Out: 4:53:19 PM ?Scope Withdrawal Time: 0 hours 9 minutes 12 seconds  ?  Total Procedure Duration: 0 hours 39 minutes 13 seconds  ?Findings:                 The perianal and digital rectal examinations were  ?                          normal. Pertinent negatives include normal  ?                          sphincter tone and no palpable rectal lesions. ?                          A 4 mm polyp was found in the sigmoid colon. The  ?                          polyp was flat. The polyp was removed with a cold  ?                          snare. Resection and retrieval were complete.  ?                          Estimated blood loss was minimal. ?                          Normal mucosa was found in the entire colon.  ?                          Biopsies for histology were taken with a cold  ?                          forceps from the ascending colon, transverse colon,  ?                          descending colon and sigmoid colon for evaluation  ?                          of microscopic colitis. Estimated blood  loss was  ?                          minimal. ?                          The terminal ileum appeared normal. ?                          The retroflexed view of the distal rectum and anal  ?                          verge was normal and showed no anal or rectal  ?                          abnormalities. ?Complications:            No immediate complications. ?Estimated Blood Loss:     Estimated blood loss was minimal. ?Impression:               -  One 4 mm polyp in the sigmoid colon, removed with  ?                          a cold snare. Resected and retrieved. ?                          - Normal mucosa in the entire examined colon.  ?                          Biopsied. ?                          - The examined portion of the ileum was normal. ?                          - The distal rectum and anal verge are normal on  ?                          retroflexion view. ?Recommendation:           - Patient has a contact number available for  ?                          emergencies. The signs and symptoms of potential  ?                          delayed complications were discussed with the  ?                          patient. Return to normal activities tomorrow.  ?                          Written discharge instructions were provided to the  ?                          patient. ?                          - Continue present medications. ?                          - Await pathology results. ?                          - High fiber diet and low fat diet. ?Lana Flaim E. Candis Schatz, MD ?04/07/2021 4:59:39 PM ?This report has been signed electronically. ?

## 2021-04-07 NOTE — Progress Notes (Signed)
VS completed by CW. ? ? ?Medical history reviewed and updated. ? ?

## 2021-04-09 ENCOUNTER — Telehealth: Payer: Self-pay

## 2021-04-09 ENCOUNTER — Telehealth: Payer: Self-pay | Admitting: *Deleted

## 2021-04-09 LAB — GI PROFILE, STOOL, PCR

## 2021-04-09 NOTE — Telephone Encounter (Signed)
?  Follow up Call- ? ? ?  04/07/2021  ?  3:18 PM  ?Call back number  ?Post procedure Call Back phone  # [phone]  ?Permission to leave phone message Yes  ?  ? ?Patient questions: ? ?Do you have a fever, pain , or abdominal swelling? No. ?Pain Score  0 * ? ?Have you tolerated food without any problems? Yes.   ? ?Have you been able to return to your normal activities? Yes.   ? ?Do you have any questions about your discharge instructions: ?Diet   No. ?Medications  No. ?Follow up visit  No. ? ?Do you have questions or concerns about your Care? No. ? ?Actions: ?* If pain score is 4 or above: ?No action needed, pain <4. ? ? ?

## 2021-04-09 NOTE — Telephone Encounter (Signed)
Attempted f/u call. No answer, VM not setup. 

## 2021-04-11 LAB — CALPROTECTIN, FECAL: Calprotectin, Fecal: <16 ug/g (ref 0–120)

## 2021-04-13 ENCOUNTER — Encounter: Payer: Self-pay | Admitting: Gastroenterology

## 2021-04-13 NOTE — Progress Notes (Signed)
Linda Kent, ?All of your tests have come back unremarkable.  Testing was negative for celiac disease, C diff colitis and parasitic infections.  Inflammatory markers are normal. ?Your TSH level was slightly low (0.15, normal 0.35-5.5), which may indicate your levothyroxine dose needs to be adjusted.  I would reach out to Dr. Posey Pronto to see if he feels any changes are needed based on this result.  ? ?Biopsies of your colon were negative for microscopic colitis as a cause of diarrhea.  ? ?The polyp which I removed during your recent procedure was proven to be completely benign but is considered a "pre-cancerous" polyp that MAY have grown into cancer if it had not been removed.  Studies shows that at least 20% of women over age 30 and 30% of men over age 43 have pre-cancerous polyps.   ?Because of your age, and the very low risk findings on your colonoscopy, I recommend against any further colon cancer screening. ? ?I recommend you try rifaximin for your diarrhea-predominant IBS.  Take this medication three times a day for 14 days.  I am hopeful this will help your diarrhea. ? ?Linda Kent,  ?Can you please relay the following information to Linda Kent and place a script for Xifaxan 550 mg po tid x 14 days #42 rf0? ? ?

## 2021-04-14 ENCOUNTER — Other Ambulatory Visit: Payer: Self-pay

## 2021-04-14 MED ORDER — RIFAXIMIN 550 MG PO TABS
550.0000 mg | ORAL_TABLET | Freq: Three times a day (TID) | ORAL | 0 refills | Status: AC
Start: 2021-04-14 — End: 2021-04-28

## 2021-04-15 ENCOUNTER — Encounter: Payer: Self-pay | Admitting: Family Medicine

## 2021-04-15 ENCOUNTER — Telehealth (INDEPENDENT_AMBULATORY_CARE_PROVIDER_SITE_OTHER): Payer: Medicare Other | Admitting: Family Medicine

## 2021-04-15 DIAGNOSIS — F1190 Opioid use, unspecified, uncomplicated: Secondary | ICD-10-CM | POA: Diagnosis not present

## 2021-04-15 DIAGNOSIS — M7918 Myalgia, other site: Secondary | ICD-10-CM | POA: Diagnosis not present

## 2021-04-15 MED ORDER — HYDROCODONE-ACETAMINOPHEN 10-325 MG PO TABS: 1.0000 | ORAL_TABLET | Freq: Four times a day (QID) | ORAL | 0 refills | Status: DC | PRN

## 2021-04-15 MED ORDER — HALOBETASOL PROPIONATE 0.05 % EX CREA: TOPICAL_CREAM | Freq: Two times a day (BID) | CUTANEOUS | 5 refills | Status: DC

## 2021-04-15 MED ORDER — METHYLPHENIDATE HCL 10 MG PO TABS
10.0000 mg | ORAL_TABLET | Freq: Four times a day (QID) | ORAL | 0 refills | Status: DC
Start: 2021-05-16 — End: 2021-04-15

## 2021-04-15 MED ORDER — OXYCODONE HCL 20 MG PO TABS: 1.0000 | ORAL_TABLET | ORAL | 0 refills | Status: DC | PRN

## 2021-04-15 MED ORDER — METHYLPHENIDATE HCL 10 MG PO TABS: 10.0000 mg | ORAL_TABLET | Freq: Four times a day (QID) | ORAL | 0 refills | Status: DC

## 2021-04-15 MED ORDER — HYDROCODONE-ACETAMINOPHEN 10-325 MG PO TABS: 1.0000 | ORAL_TABLET | Freq: Four times a day (QID) | ORAL | 0 refills | Status: AC | PRN

## 2021-04-15 MED ORDER — METHYLPHENIDATE HCL 10 MG PO TABS
10.0000 mg | ORAL_TABLET | Freq: Four times a day (QID) | ORAL | 0 refills | Status: DC
Start: 2021-06-15 — End: 2021-07-20

## 2021-04-15 MED ORDER — OXYCODONE HCL 20 MG PO TABS: 1.0000 | ORAL_TABLET | ORAL | 0 refills | Status: AC | PRN

## 2021-04-15 NOTE — Progress Notes (Signed)
? ?  Subjective:  ? ? Patient ID: Linda Kent, female    DOB: 01-02-1946, 76 y.o.   MRN: [id number] ? ?HPI ?Virtual Visit via Telephone Note ? ?I connected with the patient on 04/15/21 at  1:45 PM EDT by telephone and verified that I am speaking with the correct person using two identifiers. ?  ?I discussed the limitations, risks, security and privacy concerns of performing an evaluation and management service by telephone and the availability of in person appointments. I also discussed with the patient that there may be a patient responsible charge related to this service. The patient expressed understanding and agreed to proceed. ? ?Location patient: home ?Location provider: work or home office ?Participants present for the call: patient, provider ?Patient did not have a visit in the prior 7 days to address this/these issue(s). ? ? ?History of Present Illness: ?Here for pain management, she is doing well.  ?  ?Observations/Objective: ?Patient sounds cheerful and well on the phone. ?I do not appreciate any SOB. ?Speech and thought processing are grossly intact. ?Patient reported vitals: ? ?Assessment and Plan: ?Pain management. ?Indication for chronic opioid: myofascial pain ?Medication and dose: Norco 10-325 and Oxycodone 20 mg ?# pills per month: 120 and 180 ?Last UDS date: 08-13-20 ?Opioid Treatment Agreement signed (Y/N): 04-06-17 ?Opioid Treatment Agreement last reviewed with patient:  04-15-21 ?[facility] reviewed this encounter (include red flags): Yes ?Meds were refilled. ?Alysia Penna, MD ? ? ?Follow Up Instructions: ? ? ? ? ?68341 5-10 ?99442 11-20 ?9443 21-30 ?I did not refer this patient for an OV in the next 24 hours for this/these issue(s). ? ?I discussed the assessment and treatment plan with the patient. The patient was provided an opportunity to ask questions and all were answered. The patient agreed with the plan and demonstrated an understanding of the instructions. ?  ?The patient was advised to call  back or seek an in-person evaluation if the symptoms worsen or if the condition fails to improve as anticipated. ? ?I provided 18 minutes of non-face-to-face time during this encounter. ? ? ?Alysia Penna, MD   ? ? ?Review of Systems ? ?   ?Objective:  ? Physical Exam ? ? ? ? ?   ?Assessment & Plan:  ? ? ?

## 2021-04-21 DIAGNOSIS — S52572D Other intraarticular fracture of lower end of left radius, subsequent encounter for closed fracture with routine healing: Secondary | ICD-10-CM | POA: Diagnosis not present

## 2021-04-29 DIAGNOSIS — E039 Hypothyroidism, unspecified: Secondary | ICD-10-CM | POA: Diagnosis not present

## 2021-05-06 ENCOUNTER — Telehealth: Payer: Self-pay | Admitting: Gastroenterology

## 2021-05-06 NOTE — Telephone Encounter (Signed)
Inbound call from patient stating that Dr. Candis Schatz had wrote a proscription for  Sifaxan 550 mg for 2 weeks. Insurance saying that it has been approved and pharmacy has not received medication. Patient is seeking advice as to where it was sent.  ? ?[facility] [address] [facility] [place].  ? ?Please advise.  ?

## 2021-05-07 NOTE — Telephone Encounter (Signed)
Spoke with [facility] and they stated patients Copay was $500 . I called patient and let her know that she could come pick up samples. Patient stated that her husband would come by and pick up samples. ?

## 2021-05-07 NOTE — Telephone Encounter (Signed)
Called patient and let her know that medication was sent to [facility] and that they would contact her to set up shipment. Patient stated that they possibly called yesterday and her husband told the caller they were not interested. I let patient know that would call [facility] and call her back with an update. ? ?

## 2021-05-07 NOTE — Telephone Encounter (Signed)
Patient called and stated that she would like to speak with you about her sample medication. Please advise.  ?

## 2021-05-08 NOTE — Telephone Encounter (Signed)
Called patient and spoke with her husband letting them know to take Xifixan 3 times daily for 14 days. When the course is finished patient is done with medication. ?

## 2021-05-26 ENCOUNTER — Telehealth: Payer: Self-pay | Admitting: Family Medicine

## 2021-05-26 NOTE — Telephone Encounter (Signed)
Had colonoscopy recently, there ws bleeding, dr Candis Schatz gave her '500mg'$  antibiotics. Told provider that everytime she takes antibiotics she gets a UTI. Patient just finished those antibiotics and today she feels she has a UTI. Has had them before, has urinary urgency and burning. Requesting you call something in for her UTI. Does not feel she needs an OV.    ?[id number] [place] [facility] [address] Phone:  [phone] ?Fax:  [phone] ?  ? ?

## 2021-05-26 NOTE — Telephone Encounter (Signed)
Pharmacy updated.   Please advise 

## 2021-05-27 ENCOUNTER — Other Ambulatory Visit: Payer: Self-pay

## 2021-05-27 MED ORDER — CIPROFLOXACIN HCL 500 MG PO TABS: ORAL_TABLET | ORAL | 0 refills | Status: DC

## 2021-05-27 NOTE — Telephone Encounter (Signed)
Call in Cipro 500 mg BID for 7 days  

## 2021-05-27 NOTE — Telephone Encounter (Signed)
Called patient to make aware [facility] [address].    ?

## 2021-07-17 DIAGNOSIS — M810 Age-related osteoporosis without current pathological fracture: Secondary | ICD-10-CM | POA: Diagnosis not present

## 2021-07-17 DIAGNOSIS — E039 Hypothyroidism, unspecified: Secondary | ICD-10-CM | POA: Diagnosis not present

## 2021-07-20 ENCOUNTER — Telehealth (INDEPENDENT_AMBULATORY_CARE_PROVIDER_SITE_OTHER): Payer: Medicare Other | Admitting: Family Medicine

## 2021-07-20 ENCOUNTER — Encounter: Payer: Self-pay | Admitting: Family Medicine

## 2021-07-20 DIAGNOSIS — F1190 Opioid use, unspecified, uncomplicated: Secondary | ICD-10-CM

## 2021-07-20 DIAGNOSIS — M7918 Myalgia, other site: Secondary | ICD-10-CM | POA: Diagnosis not present

## 2021-07-20 MED ORDER — METHYLPHENIDATE HCL 10 MG PO TABS: 10.0000 mg | ORAL_TABLET | Freq: Four times a day (QID) | ORAL | 0 refills | Status: DC

## 2021-07-20 MED ORDER — OXYCODONE HCL 20 MG PO TABS: 20.0000 mg | ORAL_TABLET | ORAL | 0 refills | Status: DC | PRN

## 2021-07-20 MED ORDER — HYDROCODONE-ACETAMINOPHEN 10-325 MG PO TABS: 1.0000 | ORAL_TABLET | Freq: Four times a day (QID) | ORAL | 0 refills | Status: AC | PRN

## 2021-07-20 MED ORDER — HYDROCODONE-ACETAMINOPHEN 10-325 MG PO TABS: 1.0000 | ORAL_TABLET | Freq: Four times a day (QID) | ORAL | 0 refills | Status: DC | PRN

## 2021-07-20 MED ORDER — OXYCODONE HCL 20 MG PO TABS: 20.0000 mg | ORAL_TABLET | ORAL | 0 refills | Status: AC | PRN

## 2021-07-20 NOTE — Progress Notes (Signed)
   Subjective:    Patient ID: LLUVIA GWYNNE, female    DOB: 1945-07-14, 76 y.o.   MRN: [id number]  HPI Virtual Visit via Telephone Note  I connected with the patient on 07/20/21 at  1:30 PM EDT by telephone and verified that I am speaking with the correct person using two identifiers.   I discussed the limitations, risks, security and privacy concerns of performing an evaluation and management service by telephone and the availability of in person appointments. I also discussed with the patient that there may be a patient responsible charge related to this service. The patient expressed understanding and agreed to proceed.  Location patient: home Location provider: work or home office Participants present for the call: patient, provider Patient did not have a visit in the prior 7 days to address this/these issue(s).   History of Present Illness: Here for pain management. She is doing fairly well.    Observations/Objective: Patient sounds cheerful and well on the phone. I do not appreciate any SOB. Speech and thought processing are grossly intact. Patient reported vitals:  Assessment and Plan: Pain management. Indication for chronic opioid: myofascial pain Medication and dose: Norco 10-325 and Oxycodone 20 mg # pills per month: 120 and 180 Last UDS date: 08-13-20 Opioid Treatment Agreement signed (Y/N): 04-06-17 Opioid Treatment Agreement last reviewed with patient:  07-20-21 [place] reviewed this encounter (include red flags): Yes Meds were refilled.  Alysia Penna, MD   Follow Up Instructions:     [phone] 5-10 [phone] 11-20 9443 21-30 I did not refer this patient for an OV in the next 24 hours for this/these issue(s).  I discussed the assessment and treatment plan with the patient. The patient was provided an opportunity to ask questions and all were answered. The patient agreed with the plan and demonstrated an understanding of the instructions.   The patient was advised to  call back or seek an in-person evaluation if the symptoms worsen or if the condition fails to improve as anticipated.  I provided 15 minutes of non-face-to-face time during this encounter.   Alysia Penna, MD     Review of Systems     Objective:   Physical Exam        Assessment & Plan:

## 2021-08-04 DIAGNOSIS — H2513 Age-related nuclear cataract, bilateral: Secondary | ICD-10-CM | POA: Diagnosis not present

## 2021-08-04 DIAGNOSIS — H40033 Anatomical narrow angle, bilateral: Secondary | ICD-10-CM | POA: Diagnosis not present

## 2021-08-10 ENCOUNTER — Encounter: Payer: Medicare Other | Admitting: Family Medicine

## 2021-08-10 ENCOUNTER — Telehealth: Payer: Self-pay | Admitting: Family Medicine

## 2021-08-10 NOTE — Telephone Encounter (Signed)
Pt is calling to rsc her cpe and she also had a fall last week and hurt her back . Pt decline to speak with nurse . Pt said her back is better now. Pt has rsc her cpe to 08-18-2021

## 2021-08-11 ENCOUNTER — Encounter: Payer: Medicare Other | Admitting: Family Medicine

## 2021-08-18 ENCOUNTER — Ambulatory Visit (INDEPENDENT_AMBULATORY_CARE_PROVIDER_SITE_OTHER): Payer: Medicare Other | Admitting: Family Medicine

## 2021-08-18 ENCOUNTER — Encounter: Payer: Self-pay | Admitting: Family Medicine

## 2021-08-18 VITALS — BP 102/70 | HR 99 | Temp 97.8°F | Wt 132.0 lb

## 2021-08-18 DIAGNOSIS — F909 Attention-deficit hyperactivity disorder, unspecified type: Secondary | ICD-10-CM

## 2021-08-18 DIAGNOSIS — F32A Depression, unspecified: Secondary | ICD-10-CM | POA: Diagnosis not present

## 2021-08-18 DIAGNOSIS — M7918 Myalgia, other site: Secondary | ICD-10-CM

## 2021-08-18 DIAGNOSIS — E782 Mixed hyperlipidemia: Secondary | ICD-10-CM | POA: Diagnosis not present

## 2021-08-18 DIAGNOSIS — M810 Age-related osteoporosis without current pathological fracture: Secondary | ICD-10-CM

## 2021-08-18 DIAGNOSIS — F411 Generalized anxiety disorder: Secondary | ICD-10-CM | POA: Diagnosis not present

## 2021-08-18 DIAGNOSIS — E039 Hypothyroidism, unspecified: Secondary | ICD-10-CM

## 2021-08-18 DIAGNOSIS — F1190 Opioid use, unspecified, uncomplicated: Secondary | ICD-10-CM

## 2021-08-18 DIAGNOSIS — R739 Hyperglycemia, unspecified: Secondary | ICD-10-CM | POA: Diagnosis not present

## 2021-08-18 DIAGNOSIS — K581 Irritable bowel syndrome with constipation: Secondary | ICD-10-CM

## 2021-08-18 DIAGNOSIS — G4700 Insomnia, unspecified: Secondary | ICD-10-CM

## 2021-08-18 DIAGNOSIS — G43909 Migraine, unspecified, not intractable, without status migrainosus: Secondary | ICD-10-CM

## 2021-08-18 LAB — CBC WITH DIFFERENTIAL/PLATELET
Basophils Absolute: 0 10*3/uL (ref 0.0–0.1)
Basophils Relative: 0.5 % (ref 0.0–3.0)
Eosinophils Absolute: 0.1 10*3/uL (ref 0.0–0.7)
Eosinophils Relative: 2.1 % (ref 0.0–5.0)
HCT: 42.5 % (ref 36.0–46.0)
Hemoglobin: 14 g/dL (ref 12.0–15.0)
Lymphocytes Relative: 28.5 % (ref 12.0–46.0)
Lymphs Abs: 1.7 10*3/uL (ref 0.7–4.0)
MCHC: 33 g/dL (ref 30.0–36.0)
MCV: 99.6 fl (ref 78.0–100.0)
Monocytes Absolute: 0.3 10*3/uL (ref 0.1–1.0)
Monocytes Relative: 5.2 % (ref 3.0–12.0)
Neutro Abs: 3.7 10*3/uL (ref 1.4–7.7)
Neutrophils Relative %: 63.7 % (ref 43.0–77.0)
Platelets: 284 10*3/uL (ref 150.0–400.0)
RBC: 4.27 Mil/uL (ref 3.87–5.11)
RDW: 13.5 % (ref 11.5–15.5)
WBC: 5.8 10*3/uL (ref 4.0–10.5)

## 2021-08-18 LAB — BASIC METABOLIC PANEL
BUN: 18 mg/dL (ref 6–23)
CO2: 27 mEq/L (ref 19–32)
Calcium: 9.4 mg/dL (ref 8.4–10.5)
Chloride: 104 mEq/L (ref 96–112)
Creatinine, Ser: 0.81 mg/dL (ref 0.40–1.20)
GFR: 69.75 mL/min (ref >60.00)
Glucose, Bld: 96 mg/dL (ref 70–99)
Potassium: 4.2 mEq/L (ref 3.5–5.1)
Sodium: 138 mEq/L (ref 135–145)

## 2021-08-18 LAB — LIPID PANEL
Cholesterol: 226 mg/dL — ABNORMAL HIGH (ref 0–200)
HDL: 55 mg/dL (ref >39.00)
LDL Cholesterol: 132 mg/dL — ABNORMAL HIGH (ref 0–99)
NonHDL: 171.45
Total CHOL/HDL Ratio: 4
Triglycerides: 195 mg/dL — ABNORMAL HIGH (ref 0.0–149.0)
VLDL: 39 mg/dL (ref 0.0–40.0)

## 2021-08-18 LAB — TSH: TSH: 1.22 u[IU]/mL (ref 0.35–5.50)

## 2021-08-18 LAB — HEMOGLOBIN A1C: Hgb A1c MFr Bld: 4.7 % (ref 4.6–6.5)

## 2021-08-18 LAB — HEPATIC FUNCTION PANEL
ALT: 13 U/L (ref 0–35)
AST: 21 U/L (ref 0–37)
Albumin: 4 g/dL (ref 3.5–5.2)
Alkaline Phosphatase: 78 U/L (ref 39–117)
Bilirubin, Direct: 0.1 mg/dL (ref 0.0–0.3)
Total Bilirubin: 0.4 mg/dL (ref 0.2–1.2)
Total Protein: 7 g/dL (ref 6.0–8.3)

## 2021-08-18 LAB — T3, FREE: T3, Free: 2.9 pg/mL (ref 2.3–4.2)

## 2021-08-18 LAB — T4, FREE: Free T4: 0.93 ng/dL (ref 0.60–1.60)

## 2021-08-18 MED ORDER — ZALEPLON 10 MG PO CAPS: 10.0000 mg | ORAL_CAPSULE | Freq: Every evening | ORAL | 1 refills | Status: DC | PRN

## 2021-08-18 MED ORDER — NONFORMULARY OR COMPOUNDED ITEM: 6 refills | Status: DC

## 2021-08-18 MED ORDER — HALOBETASOL PROPIONATE 0.05 % EX CREA: TOPICAL_CREAM | Freq: Two times a day (BID) | CUTANEOUS | 5 refills | Status: DC

## 2021-08-18 MED ORDER — METRONIDAZOLE 0.75 % EX CREA: 1.0000 | TOPICAL_CREAM | Freq: Every day | CUTANEOUS | 3 refills | Status: DC

## 2021-08-18 MED ORDER — MECLIZINE HCL 25 MG PO TABS: 25.0000 mg | ORAL_TABLET | ORAL | 5 refills | Status: DC | PRN

## 2021-08-18 NOTE — Progress Notes (Signed)
Subjective:    Patient ID: Linda Kent, female    DOB: 08/16/1945, 76 y.o.   MRN: [id number]  HPI Here to follow up on issues. In general she had been doing well except she says her legs are getting weaker. She has trouble walking long distances and she needs to sit down to rest frequently. Her migraines are stable. Her myofascial pain syndrome is stable, and she is in a pain management program with [place] for that. She sees Dr. Posey Pronto for her hypothyroidism, and he has lowered her dose several times in the past year from 100 mcg to 88 mcg to 75 mcg daily. Her depression and anxiety are stable.    Review of Systems  Constitutional: Negative.   HENT: Negative.    Eyes: Negative.   Respiratory: Negative.    Cardiovascular: Negative.   Gastrointestinal: Negative.   Genitourinary:  Negative for decreased urine volume, difficulty urinating, dyspareunia, dysuria, enuresis, flank pain, frequency, hematuria, pelvic pain and urgency.  Musculoskeletal:  Positive for myalgias.  Skin: Negative.   Neurological:  Positive for weakness. Negative for headaches.  Psychiatric/Behavioral: Negative.         Objective:   Physical Exam Constitutional:      General: She is not in acute distress.    Appearance: She is well-developed.     Comments: In a wheelchair   HENT:     Head: Normocephalic and atraumatic.     Right Ear: External ear normal.     Left Ear: External ear normal.     Nose: Nose normal.     Mouth/Throat:     Pharynx: No oropharyngeal exudate.  Eyes:     General: No scleral icterus.    Conjunctiva/sclera: Conjunctivae normal.     Pupils: Pupils are equal, round, and reactive to light.  Neck:     Thyroid: No thyromegaly.     Vascular: No JVD.  Cardiovascular:     Rate and Rhythm: Normal rate and regular rhythm.     Heart sounds: Normal heart sounds. No murmur heard.    No friction rub. No gallop.  Pulmonary:     Effort: Pulmonary effort is normal. No respiratory distress.      Breath sounds: Normal breath sounds. No wheezing or rales.  Chest:     Chest wall: No tenderness.  Abdominal:     General: Bowel sounds are normal. There is no distension.     Palpations: Abdomen is soft. There is no mass.     Tenderness: There is no abdominal tenderness. There is no guarding or rebound.  Musculoskeletal:        General: No tenderness. Normal range of motion.     Cervical back: Normal range of motion and neck supple.  Lymphadenopathy:     Cervical: No cervical adenopathy.  Skin:    General: Skin is warm and dry.     Findings: No erythema or rash.  Neurological:     Mental Status: She is alert and oriented to person, place, and time.     Cranial Nerves: No cranial nerve deficit.     Motor: No abnormal muscle tone.     Coordination: Coordination normal.     Deep Tendon Reflexes: Reflexes are normal and symmetric. Reflexes normal.  Psychiatric:        Behavior: Behavior normal.        Thought Content: Thought content normal.        Judgment: Judgment normal.  Assessment & Plan:  Her depression and anxiety and migraines and myofascial pain are stable. We will get fasting labs today to check lipids, A1c, etc. Her ADHD is stable. We spent a total of ( 32  ) minutes reviewing records and discussing these issues.  Alysia Penna, MD

## 2021-08-21 ENCOUNTER — Ambulatory Visit: Payer: Medicare Other | Admitting: Family Medicine

## 2021-08-21 ENCOUNTER — Encounter: Payer: Self-pay | Admitting: Family Medicine

## 2021-08-21 DIAGNOSIS — M7918 Myalgia, other site: Secondary | ICD-10-CM

## 2021-08-21 LAB — DRUG MONITOR, PANEL 1, W/CONF, URINE
Alphahydroxyalprazolam: NEGATIVE ng/mL (ref <25)
Alphahydroxymidazolam: NEGATIVE ng/mL (ref <50)
Alphahydroxytriazolam: NEGATIVE ng/mL (ref <50)
Aminoclonazepam: NEGATIVE ng/mL (ref <25)
Amphetamines: NEGATIVE ng/mL (ref <500)
Barbiturates: NEGATIVE ng/mL (ref <300)
Benzodiazepines: POSITIVE ng/mL — AB (ref <100)
Cocaine Metabolite: NEGATIVE ng/mL (ref <150)
Codeine: NEGATIVE ng/mL (ref <50)
Creatinine: 76.3 mg/dL (ref >=20.0)
Hydrocodone: 218 ng/mL — ABNORMAL HIGH (ref <50)
Hydromorphone: 117 ng/mL — ABNORMAL HIGH (ref <50)
Hydroxyethylflurazepam: NEGATIVE ng/mL (ref <50)
Lorazepam: 2192 ng/mL — ABNORMAL HIGH (ref <50)
Marijuana Metabolite: NEGATIVE ng/mL (ref <20)
Methadone Metabolite: NEGATIVE ng/mL (ref <100)
Morphine: NEGATIVE ng/mL (ref <50)
Nordiazepam: NEGATIVE ng/mL (ref <50)
Norhydrocodone: 1383 ng/mL — ABNORMAL HIGH (ref <50)
Noroxycodone: >10000 ng/mL — ABNORMAL HIGH (ref <50)
Opiates: POSITIVE ng/mL — AB (ref <100)
Oxazepam: NEGATIVE ng/mL (ref <50)
Oxidant: NEGATIVE ug/mL (ref <200)
Oxycodone: 727 ng/mL — ABNORMAL HIGH (ref <50)
Oxycodone: POSITIVE ng/mL — AB (ref <100)
Oxymorphone: 3998 ng/mL — ABNORMAL HIGH (ref <50)
Phencyclidine: NEGATIVE ng/mL (ref <25)
Temazepam: NEGATIVE ng/mL (ref <50)
pH: 7.2 (ref 4.5–9.0)

## 2021-08-21 LAB — DM TEMPLATE

## 2021-08-21 NOTE — Progress Notes (Signed)
   Subjective:    Patient ID: Linda Kent, female    DOB: 21-Apr-1945, 76 y.o.   MRN: [id number]  HPI Virtual Visit via Telephone Note  I connected with the patient on 08/21/21 at 11:15 AM EDT by telephone and verified that I am speaking with the correct person using two identifiers.   I discussed the limitations, risks, security and privacy concerns of performing an evaluation and management service by telephone and the availability of in person appointments. I also discussed with the patient that there may be a patient responsible charge related to this service. The patient expressed understanding and agreed to proceed.  Location patient: home Location provider: work or home office Participants present for the call: patient, provider Patient did not have a visit in the prior 7 days to address this/these issue(s).   History of Present Illness: Pain management.   Observations/Objective: Patient sounds cheerful and well on the phone. I do not appreciate any SOB. Speech and thought processing are grossly intact. Patient reported vitals:  Assessment and Plan: The patient set this up as a PMV today, but after looking at her chart, it was obvious that none is needed yet. Our last PMV was on 07-20-21, and we gave her 3 months of refills. We agreed to talk again around the first of October.  Alysia Penna, MD   Follow Up Instructions:     [phone] 5-10 [phone] 11-20 9443 21-30 I did not refer this patient for an OV in the next 24 hours for this/these issue(s).  I discussed the assessment and treatment plan with the patient. The patient was provided an opportunity to ask questions and all were answered. The patient agreed with the plan and demonstrated an understanding of the instructions.   The patient was advised to call back or seek an in-person evaluation if the symptoms worsen or if the condition fails to improve as anticipated.  I provided 4 minutes of non-face-to-face time during  this encounter.   Alysia Penna, MD     Review of Systems     Objective:   Physical Exam        Assessment & Plan:

## 2021-09-11 ENCOUNTER — Telehealth: Payer: Self-pay | Admitting: Family Medicine

## 2021-09-11 NOTE — Telephone Encounter (Signed)
Pt called to ask if CMA could call her back to go over the results of her CPE on 08/18/21.  [phone]

## 2021-09-14 NOTE — Telephone Encounter (Signed)
Pt aware that PCP is out of office & will review labs upon his return next week. Pt expecting a call from [place].

## 2021-09-14 NOTE — Telephone Encounter (Signed)
Called patient, no VM.   Unable to leave message.   Will contact again

## 2021-09-17 ENCOUNTER — Other Ambulatory Visit: Payer: Self-pay | Admitting: Family Medicine

## 2021-09-17 NOTE — Telephone Encounter (Addendum)
Dr. Barbie Banner patient  Last refill- Trilafon '4mg'$ --08/13/20 Last refill Topamax- 09/17/20- 90 tabs, 3 refills.   Last B/P-102/70 on 08/18/21  Last OV CPE- 08/18/21.   Can this patient receive a refill?

## 2021-09-17 NOTE — Telephone Encounter (Signed)
Pt called to request a refill of the following:  topiramate (TOPAMAX) 50 MG tablet  Pt states she does not have enough pills to last until MD gets back.  LOV:  08/18/21  - CPE  Please advise.  [id number] [place] Phone:  [phone] Fax:  [phone]

## 2021-09-22 NOTE — Telephone Encounter (Signed)
Tell her these results were all normal except for mildly elevated cholesterol. She should watch her diet carefully.

## 2021-09-24 NOTE — Telephone Encounter (Addendum)
Spoke with patient about lab results.   Voiced  understand.       Patient requested refill for  a 1 year supply for the Nonformulary or Compound Item to be sent to [facility].    Pharmacy updated.

## 2021-09-25 ENCOUNTER — Ambulatory Visit: Payer: Medicare Other | Admitting: Family Medicine

## 2021-09-25 MED ORDER — NONFORMULARY OR COMPOUNDED ITEM
6 refills | Status: DC
Start: 2021-09-25 — End: 2021-10-09

## 2021-09-25 NOTE — Telephone Encounter (Signed)
Lvm for patient requested  prescription was sent to [facility].

## 2021-09-25 NOTE — Addendum Note (Signed)
Addended by: Alysia Penna A on: 09/25/2021 07:45 AM   Modules accepted: Orders

## 2021-09-25 NOTE — Telephone Encounter (Signed)
I already sent this on 08-18-21, but I did it again now

## 2021-10-01 ENCOUNTER — Telehealth: Payer: Self-pay | Admitting: Family Medicine

## 2021-10-01 NOTE — Telephone Encounter (Signed)
Left message for patient to call back and schedule Medicare Annual Wellness Visit (AWV) either virtually or in office. Left  my Herbie Drape number [phone]   Last AWV 10/13/20 ; please schedule at anytime with [facility] Nurse Health Advisor 1 or 2

## 2021-10-07 DIAGNOSIS — Z23 Encounter for immunization: Secondary | ICD-10-CM | POA: Diagnosis not present

## 2021-10-09 ENCOUNTER — Other Ambulatory Visit: Payer: Self-pay

## 2021-10-09 ENCOUNTER — Telehealth: Payer: Self-pay | Admitting: Family Medicine

## 2021-10-09 MED ORDER — NONFORMULARY OR COMPOUNDED ITEM: 6 refills | Status: DC

## 2021-10-09 MED ORDER — NONFORMULARY OR COMPOUNDED ITEM: 6 refills | Status: AC

## 2021-10-09 NOTE — Telephone Encounter (Signed)
Pt is calling and need a refill on  NONFORMULARY OR COMPOUNDED ITEM 1 each 6 09/25/2021    Sig: APPLY A DIME SIZED AMOUNT TO AFFECTED AREA 4 TIMES DAILY AS NEEDED.   Class: Print   Notes to Pharmacy: DI3/BAC2/CYC2/GABA6/TETR2 CREAM ( dispense 60 grams )    [place], [place] Phone:  [phone]  Fax:  [phone]

## 2021-10-09 NOTE — Telephone Encounter (Signed)
Prescription was on print never received at pharmacy.   Attempted to resend, prescription never received.   [place] , verbal given to pharmacist.    Patient to check at pharmacy for pickup on Monday or Tues for prescription pick up.    Called patient to inform, she was thankful and will call office if any problems when pick up med.

## 2021-10-15 ENCOUNTER — Telehealth: Payer: Self-pay

## 2021-10-15 DIAGNOSIS — Z0000 Encounter for general adult medical examination without abnormal findings: Secondary | ICD-10-CM

## 2021-10-15 NOTE — Patient Instructions (Addendum)
Linda Kent , Thank you for taking time to come for your Medicare Wellness Visit. I appreciate your ongoing commitment to your health goals. Please review the following plan we discussed and let me know if I can assist you in the future.   These are the goals we discussed:  Goals      Patient Stated     I want to live day by day the very best I can         This is a list of the screening recommended for you and due dates:  Health Maintenance  Topic Date Due   Hepatitis C Screening: USPSTF Recommendation to screen - Ages 37-79 yo.  Never done   Tetanus Vaccine  09/25/2017   COVID-19 Vaccine (5 - Pfizer series) 07/09/2021   Zoster (Shingles) Vaccine (1 of 2) 11/18/2021*   Pneumonia Vaccine (3 - PPSV23 or PCV20) 09/18/2022*   Flu Shot  Completed   DEXA scan (bone density measurement)  Completed   HPV Vaccine  Aged Out   Colon Cancer Screening  Discontinued  *Topic was postponed. The date shown is not the original due date.    Advanced directives:   Conditions/risks identified: None  Next appointment: Follow up in one year for your annual wellness visit     Preventive Care 65 Years and Older, Female Preventive care refers to lifestyle choices and visits with your health care provider that can promote health and wellness. What does preventive care include? A yearly physical exam. This is also called an annual well check. Dental exams once or twice a year. Routine eye exams. Ask your health care provider how often you should have your eyes checked. Personal lifestyle choices, including: Daily care of your teeth and gums. Regular physical activity. Eating a healthy diet. Avoiding tobacco and drug use. Limiting alcohol use. Practicing safe sex. Taking low-dose aspirin every day. Taking vitamin and mineral supplements as recommended by your health care provider. What happens during an annual well check? The services and screenings done by your health care provider during your  annual well check will depend on your age, overall health, lifestyle risk factors, and family history of disease. Counseling  Your health care provider may ask you questions about your: Alcohol use. Tobacco use. Drug use. Emotional well-being. Home and relationship well-being. Sexual activity. Eating habits. History of falls. Memory and ability to understand (cognition). Work and work Statistician. Reproductive health. Screening  You may have the following tests or measurements: Height, weight, and BMI. Blood pressure. Lipid and cholesterol levels. These may be checked every 5 years, or more frequently if you are over 83 years old. Skin check. Lung cancer screening. You may have this screening every year starting at age 65 if you have a 30-pack-year history of smoking and currently smoke or have quit within the past 15 years. Fecal occult blood test (FOBT) of the stool. You may have this test every year starting at age 46. Flexible sigmoidoscopy or colonoscopy. You may have a sigmoidoscopy every 5 years or a colonoscopy every 10 years starting at age 76. Hepatitis C blood test. Hepatitis B blood test. Sexually transmitted disease (STD) testing. Diabetes screening. This is done by checking your blood sugar (glucose) after you have not eaten for a while (fasting). You may have this done every 1-3 years. Bone density scan. This is done to screen for osteoporosis. You may have this done starting at age 28. Mammogram. This may be done every 1-2 years. Talk to your  health care provider about how often you should have regular mammograms. Talk with your health care provider about your test results, treatment options, and if necessary, the need for more tests. Vaccines  Your health care provider may recommend certain vaccines, such as: Influenza vaccine. This is recommended every year. Tetanus, diphtheria, and acellular pertussis (Tdap, Td) vaccine. You may need a Td booster every 10  years. Zoster vaccine. You may need this after age 65. Pneumococcal 13-valent conjugate (PCV13) vaccine. One dose is recommended after age 60. Pneumococcal polysaccharide (PPSV23) vaccine. One dose is recommended after age 10. Talk to your health care provider about which screenings and vaccines you need and how often you need them. This information is not intended to replace advice given to you by your health care provider. Make sure you discuss any questions you have with your health care provider. Document Released: 01/31/2015 Document Revised: 09/24/2015 Document Reviewed: 11/05/2014 Elsevier Interactive Patient Education  2017 [place] Prevention in the Home Falls can cause injuries. They can happen to people of all ages. There are many things you can do to make your home safe and to help prevent falls. What can I do on the outside of my home? Regularly fix the edges of walkways and driveways and fix any cracks. Remove anything that might make you trip as you walk through a door, such as a raised step or threshold. Trim any bushes or trees on the path to your home. Use bright outdoor lighting. Clear any walking paths of anything that might make someone trip, such as rocks or tools. Regularly check to see if handrails are loose or broken. Make sure that both sides of any steps have handrails. Any raised decks and porches should have guardrails on the edges. Have any leaves, snow, or ice cleared regularly. Use sand or salt on walking paths during winter. Clean up any spills in your garage right away. This includes oil or grease spills. What can I do in the bathroom? Use night lights. Install grab bars by the toilet and in the tub and shower. Do not use towel bars as grab bars. Use non-skid mats or decals in the tub or shower. If you need to sit down in the shower, use a plastic, non-slip stool. Keep the floor dry. Clean up any water that spills on the floor as soon as it  happens. Remove soap buildup in the tub or shower regularly. Attach bath mats securely with double-sided non-slip rug tape. Do not have throw rugs and other things on the floor that can make you trip. What can I do in the bedroom? Use night lights. Make sure that you have a light by your bed that is easy to reach. Do not use any sheets or blankets that are too big for your bed. They should not hang down onto the floor. Have a firm chair that has side arms. You can use this for support while you get dressed. Do not have throw rugs and other things on the floor that can make you trip. What can I do in the kitchen? Clean up any spills right away. Avoid walking on wet floors. Keep items that you use a lot in easy-to-reach places. If you need to reach something above you, use a strong step stool that has a grab bar. Keep electrical cords out of the way. Do not use floor polish or wax that makes floors slippery. If you must use wax, use non-skid floor wax. Do not have  throw rugs and other things on the floor that can make you trip. What can I do with my stairs? Do not leave any items on the stairs. Make sure that there are handrails on both sides of the stairs and use them. Fix handrails that are broken or loose. Make sure that handrails are as long as the stairways. Check any carpeting to make sure that it is firmly attached to the stairs. Fix any carpet that is loose or worn. Avoid having throw rugs at the top or bottom of the stairs. If you do have throw rugs, attach them to the floor with carpet tape. Make sure that you have a light switch at the top of the stairs and the bottom of the stairs. If you do not have them, ask someone to add them for you. What else can I do to help prevent falls? Wear shoes that: Do not have high heels. Have rubber bottoms. Are comfortable and fit you well. Are closed at the toe. Do not wear sandals. If you use a stepladder: Make sure that it is fully opened.  Do not climb a closed stepladder. Make sure that both sides of the stepladder are locked into place. Ask someone to hold it for you, if possible. Clearly mark and make sure that you can see: Any grab bars or handrails. First and last steps. Where the edge of each step is. Use tools that help you move around (mobility aids) if they are needed. These include: Canes. Walkers. Scooters. Crutches. Turn on the lights when you go into a dark area. Replace any light bulbs as soon as they burn out. Set up your furniture so you have a clear path. Avoid moving your furniture around. If any of your floors are uneven, fix them. If there are any pets around you, be aware of where they are. Review your medicines with your doctor. Some medicines can make you feel dizzy. This can increase your chance of falling. Ask your doctor what other things that you can do to help prevent falls. This information is not intended to replace advice given to you by your health care provider. Make sure you discuss any questions you have with your health care provider. Document Released: 10/31/2008 Document Revised: 06/12/2015 Document Reviewed: 02/08/2014 Elsevier Interactive Patient Education  2017 [employer].

## 2021-10-15 NOTE — Progress Notes (Signed)
Subjective:   Linda Kent is a 76 y.o. female who presents for Medicare Annual (Subsequent) preventive examination.  Review of Systems    Virtual Visit via Telephone Note  I connected with  Linda Kent on 10/15/21 at  2:00 PM EDT by telephone and verified that I am speaking with the correct person using two identifiers.  Location: Patient:  Provider:  Persons participating in the virtual visit: patient/Nurse Health Advisor   I discussed the limitations, risks, security and privacy concerns of performing an evaluation and management service by telephone and the availability of in person appointments. The patient expressed understanding and agreed to proceed.  Interactive audio and video telecommunications were attempted between this nurse and patient, however failed, due to patient having technical difficulties OR patient did not have access to video capability.  We continued and completed visit with audio only.  Some vital signs may be absent or patient reported.   Criselda Peaches, LPN        Objective:    There were no vitals filed for this visit. There is no height or weight on file to calculate BMI.     12/20/2020    4:43 PM 10/13/2020    2:01 PM 10/10/2019    1:41 PM  Advanced Directives  Does Patient Have a Medical Advance Directive? No Yes Yes  Type of Corporate treasurer of [place];Living will [place];Living will  Does patient want to make changes to medical advance directive?   No - Patient declined  Copy of [place] in Chart?  No - copy requested No - copy requested  Would patient like information on creating a medical advance directive? No - Patient declined      Current Medications (verified) Outpatient Encounter Medications as of 10/15/2021  Medication Sig   Ascorbic Acid (VITAMIN C) 1000 MG tablet Take 1,000 mg by mouth 2 (two) times daily. Pt taking 5,000 mg   aspirin 81 MG EC tablet Take 81 mg  by mouth daily.   Calcium 1500 MG tablet Take 1,500 mg by mouth daily. With Vitamin D   denosumab (PROLIA) 60 MG/ML SOLN injection Inject 60 mg into the skin every 6 (six) months. Administer in upper arm, thigh, or abdomen   diphenhydrAMINE (BENADRYL) 25 mg capsule Take 25 mg by mouth every 6 (six) hours as needed.   halobetasol (ULTRAVATE) 0.05 % cream Apply topically 2 (two) times daily.   HYDROcodone-acetaminophen (NORCO) 10-325 MG tablet Take 1 tablet by mouth every 6 (six) hours as needed for moderate pain.   levothyroxine (SYNTHROID) 75 MCG tablet Take 100 mcg by mouth daily. Brand name only    LORazepam (ATIVAN) 2 MG tablet Take 1 tablet (2 mg total) by mouth 3 (three) times daily.   Magnesium 100 MG TABS Take 1 tablet by mouth daily at 6 (six) AM. Taking 400 mg daily   meclizine (ANTIVERT) 25 MG tablet Take 1 tablet (25 mg total) by mouth every 4 (four) hours as needed for dizziness.   Melatonin 3 MG CAPS Take 9 mg by mouth at bedtime.   methylphenidate (RITALIN) 10 MG tablet Take 1 tablet (10 mg total) by mouth in the morning, at noon, in the evening, and at bedtime.   metroNIDAZOLE (METROCREAM) 0.75 % cream Apply 1 Application topically daily.   Multiple Vitamin (MULTIVITAMIN) tablet Take 1 tablet by mouth daily. Centrum 50 +   NON FORMULARY    NONFORMULARY OR COMPOUNDED ITEM  APPLY A DIME SIZED AMOUNT TO AFFECTED AREA 4 TIMES DAILY AS NEEDED.   Oxycodone HCl 20 MG TABS Take 1 tablet (20 mg total) by mouth every 4 (four) hours as needed (pain).   perphenazine (TRILAFON) 4 MG tablet TAKE 1 TABLET BY MOUTH EVERY 4 HOURS AS NEEDED FOR MIGRAINES   Potassium Gluconate 550 MG TABS Take 1 tablet by mouth daily.   Probiotic Product (ALIGN PO) Take 1 capsule by mouth daily.    rizatriptan (MAXALT) 10 MG tablet Take 1 tablet (10 mg total) by mouth as needed for migraine.   topiramate (TOPAMAX) 50 MG tablet TAKE 3 TABLETS BY MOUTH EVERY DAY   zaleplon (SONATA) 10 MG capsule Take 1 capsule (10 mg  total) by mouth at bedtime as needed. for sleep   No facility-administered encounter medications on file as of 10/15/2021.    Allergies (verified) Ezetimibe, Atorvastatin calcium [atorvastatin], Crestor [rosuvastatin calcium], and Cephalexin   History: Past Medical History:  Diagnosis Date   Acne rosacea    ADD (attention deficit disorder)    Back pain    Chronic insomnia    Colon polyps    Depression    Hyperlipidemia    Hypothyroidism    sees Dr. Elmarie Shiley    IBS (irritable bowel syndrome)    Migraines    sees Dr. Orie Rout    Myofascial pain syndrome    sees Dr. Elta Guadeloupe Philips    Osteoporosis    sees Dr. Elmarie Shiley    Post menopausal syndrome    Recurrent cystitis    after intercourse, controlled with macrobid    Routine gynecological examination    sees [facility]    Past Surgical History:  Procedure Laterality Date   ABDOMINAL HYSTERECTOMY     [facility] for prolapse   APPENDECTOMY     COLONOSCOPY  2017   per Dr. Amedeo Plenty, adenomatous polyps, repeat in 5 yrs    OVARIAN CYST REMOVAL     excised on left    TONSILLECTOMY     UPPER GASTROINTESTINAL ENDOSCOPY     Family History  Problem Relation Age of Onset   Other Sister        low serotonin levels   Colon polyps Sister    Other Brother        low serotonin levels    Colon cancer Paternal Uncle    Colon cancer Other    Cancer Other 64       puncle decreased from colon cancer    Rectal cancer Neg Hx    Stomach cancer Neg Hx    Social History   Socioeconomic History   Marital status: Married    Spouse name: Not on file   Number of children: Not on file   Years of education: Not on file   Highest education level: Not on file  Occupational History   Not on file  Tobacco Use   Smoking status: Never   Smokeless tobacco: Never  Substance and Sexual Activity   Alcohol use: No    Alcohol/week: 0.0 standard drinks of alcohol   Drug use: No   Sexual activity: Yes  Other Topics Concern   Not on  file  Social History Narrative   Not on file   Social Determinants of Health   Financial Resource Strain: Low Risk  (10/13/2020)   Overall Financial Resource Strain (CARDIA)    Difficulty of Paying Living Expenses: Not hard at all  Food Insecurity: No Food Insecurity (10/13/2020)  Hunger Vital Sign    Worried About Running Out of Food in the Last Year: Never true    Ran Out of Food in the Last Year: Never true  Transportation Needs: No Transportation Needs (10/13/2020)   PRAPARE - Hydrologist (Medical): No    Lack of Transportation (Non-Medical): No  Physical Activity: Inactive (10/13/2020)   Exercise Vital Sign    Days of Exercise per Week: 0 days    Minutes of Exercise per Session: 0 min  Stress: No Stress Concern Present (10/13/2020)   [place]    Feeling of Stress : Not at all  Social Connections: Moderately Isolated (10/13/2020)   Social Connection and Isolation Panel [NHANES]    Frequency of Communication with Friends and Family: Three times a week    Frequency of Social Gatherings with Friends and Family: Three times a week    Attends Religious Services: Never    Active Member of Clubs or Organizations: No    Attends Archivist Meetings: Never    Marital Status: Married    Tobacco Counseling Counseling given: Not Answered   Clinical Intake:                 Diabetic?         Activities of Daily Living     No data to display          Patient Care Team: Laurey Morale, MD as PCP - General (Family Medicine)  Indicate any recent Medical Services you may have received from other than [facility] providers in the past year (date may be approximate).     Assessment:   This is a routine wellness examination for Linda Kent.  Hearing/Vision screen No results found.  Dietary issues and exercise activities discussed:     Goals Addressed   None     Depression Screen    08/18/2021    4:48 PM 07/20/2021    1:30 PM 10/13/2020    2:03 PM 10/10/2019    1:47 PM 06/11/2016    9:47 AM  PHQ 2/9 Scores  PHQ - 2 Score 0 0 0 1 0  PHQ- 9 Score 3 0  1     Fall Risk    08/18/2021    4:47 PM 07/20/2021    1:30 PM 10/13/2020    2:03 PM 08/13/2020    9:00 AM 10/10/2019    1:45 PM  [place] in the past year? 1 0 0 0 1  Number falls in past yr: 1 0 0  0  Injury with Fall? 1 0 0  0  Risk for fall due to : History of fall(s) No Fall Risks   History of fall(s);Impaired balance/gait  Follow up Falls evaluation completed Falls evaluation completed Falls evaluation completed  Falls evaluation completed;Falls prevention discussed    FALL RISK PREVENTION PERTAINING TO THE HOME:  Any stairs in or around the home?  If so, are there any without handrails?  Home free of loose throw rugs in walkways, pet beds, electrical cords, etc?  Adequate lighting in your home to reduce risk of falls?   ASSISTIVE DEVICES UTILIZED TO PREVENT FALLS:  Life alert?  Use of a cane, walker or w/c?  Grab bars in the bathroom?  Shower chair or bench in shower?  Elevated toilet seat or a handicapped toilet?   TIMED UP AND GO:  Was the test performed? Marland Kitchen  Length of time to ambulate 10 feet:  sec.     Cognitive Function:        Immunizations Immunization History  Administered Date(s) Administered   Influenza Split 11/05/2010, 10/04/2012   Influenza Whole 10/24/2007, 10/29/2008   Influenza, High Dose Seasonal PF 10/22/2013, 10/14/2017, 10/12/2018   Influenza-Unspecified 10/02/2014, 10/15/2015   PFIZER(Purple Top)SARS-COV-2 Vaccination 02/06/2019, 02/27/2019   Pneumococcal Conjugate-13 06/11/2015   Pneumococcal Polysaccharide-23 10/24/2007   Td 09/26/2007            Qualifies for Shingles Vaccine?   Zostavax completed     Screening Tests Health Maintenance  Topic Date Due   Hepatitis C Screening  Never done   TETANUS/TDAP  09/25/2017    COVID-19 Vaccine (5 - Pfizer series) 07/09/2021   Zoster Vaccines- Shingrix (1 of 2) 11/18/2021 (Originally 09/02/1993)   Pneumonia Vaccine 89+ Years old (3 - PPSV23 or PCV20) 09/18/2022 (Originally 06/10/2016)   INFLUENZA VACCINE  Completed   DEXA SCAN  Completed   HPV VACCINES  Aged Out   COLONOSCOPY (Pts 45-64yr Insurance coverage will need to be confirmed)  Discontinued    Health Maintenance  Health Maintenance Due  Topic Date Due   Hepatitis C Screening  Never done   TETANUS/TDAP  09/25/2017   COVID-19 Vaccine (5 - Pfizer series) 07/09/2021          Lung Cancer Screening: (Low Dose CT Chest recommended if Age 78-80years, 30 pack-year currently smoking OR have quit w/in 15years.)  qualify.   Lung Cancer Screening Referral:   Additional Screening:  Hepatitis C Screening:  qualify; Completed   Vision Screening: Recommended annual ophthalmology exams for early detection of glaucoma and other disorders of the eye. Is the patient up to date with their annual eye exam?   Who is the provider or what is the name of the office in which the patient attends annual eye exams?  If pt is not established with a provider, would they like to be referred to a provider to establish care? .   Dental Screening: Recommended annual dental exams for proper oral hygiene  Community Resource Referral / Chronic Care Management: CRR required this visit?    CCM required this visit?       Plan:     I have personally reviewed and noted the following in the patient's chart:   Medical and social history Use of alcohol, tobacco or illicit drugs  Current medications and supplements including opioid prescriptions.  Functional ability and status Nutritional status Physical activity Advanced directives List of other physicians Hospitalizations, surgeries, and ER visits in previous 12 months Vitals Screenings to include cognitive, depression, and falls Referrals and appointments  In  addition, I have reviewed and discussed with patient certain preventive protocols, quality metrics, and best practice recommendations. A written personalized care plan for preventive services as well as general preventive health recommendations were provided to patient.     BCriselda Peaches LPN   [id number]  Nurse Notes: Patient stated unable to complete visit today will call back and reschedule.     This encounter was created in error - please disregard.

## 2021-10-15 NOTE — Telephone Encounter (Signed)
Contacted patient on preferred number listed in notes for scheduled AWV. Patient stated unable to complete visit today and will call back to reschedule.

## 2021-10-16 ENCOUNTER — Other Ambulatory Visit: Payer: Self-pay | Admitting: Internal Medicine

## 2021-10-20 NOTE — Telephone Encounter (Signed)
Pt LOV was on 08/31/2021 Last refill was done on 09/17/21 by Dr Jerilee Hoh, please advise

## 2021-10-21 ENCOUNTER — Other Ambulatory Visit: Payer: Self-pay | Admitting: Family Medicine

## 2021-10-21 NOTE — Telephone Encounter (Signed)
Last refill-01/14/2021--90 tabs, 5 refills Last OV-08/21/2021  Next OV PMV-10/23/21

## 2021-10-23 ENCOUNTER — Encounter: Payer: Self-pay | Admitting: Family Medicine

## 2021-10-23 ENCOUNTER — Telehealth (INDEPENDENT_AMBULATORY_CARE_PROVIDER_SITE_OTHER): Payer: Medicare Other | Admitting: Family Medicine

## 2021-10-23 DIAGNOSIS — F1190 Opioid use, unspecified, uncomplicated: Secondary | ICD-10-CM | POA: Diagnosis not present

## 2021-10-23 DIAGNOSIS — M7918 Myalgia, other site: Secondary | ICD-10-CM

## 2021-10-23 MED ORDER — HYDROCODONE-ACETAMINOPHEN 10-325 MG PO TABS: 1.0000 | ORAL_TABLET | Freq: Four times a day (QID) | ORAL | 0 refills | Status: DC | PRN

## 2021-10-23 MED ORDER — METHYLPHENIDATE HCL 10 MG PO TABS: 10.0000 mg | ORAL_TABLET | Freq: Four times a day (QID) | ORAL | 0 refills | Status: DC

## 2021-10-23 MED ORDER — OXYCODONE HCL 20 MG PO TABS: 20.0000 mg | ORAL_TABLET | ORAL | 0 refills | Status: DC | PRN

## 2021-10-23 MED ORDER — TOPIRAMATE 50 MG PO TABS: 150.0000 mg | ORAL_TABLET | Freq: Every day | ORAL | 3 refills | Status: DC

## 2021-10-23 NOTE — Progress Notes (Signed)
   Subjective:    Patient ID: Linda Kent, female    DOB: September 11, 1945, 76 y.o.   MRN: [id number]  HPI Virtual Visit via Telephone Note  I connected with the patient on 10/23/21 at  1:00 PM EDT by telephone and verified that I am speaking with the correct person using two identifiers.   I discussed the limitations, risks, security and privacy concerns of performing an evaluation and management service by telephone and the availability of in person appointments. I also discussed with the patient that there may be a patient responsible charge related to this service. The patient expressed understanding and agreed to proceed.  Location patient: home Location provider: work or home office Participants present for the call: patient, provider Patient did not have a visit in the prior 7 days to address this/these issue(s).   History of Present Illness: Here for pain management. She is about the same with good days and bad days.    Observations/Objective: Patient sounds cheerful and well on the phone. I do not appreciate any SOB. Speech and thought processing are grossly intact. Patient reported vitals:  Assessment and Plan: Pain management. Indication for chronic opioid: myfascial pain Medication and dose: Norco 10-325 and Oxycodone 20 mg # pills per month: 120 and 180 Last UDS date: 08-18-21 Opioid Treatment Agreement signed (Y/N): 04-06-17 Opioid Treatment Agreement last reviewed with patient:  10-23-21 [facility] reviewed this encounter (include red flags): Yes Meds were refilled. Alysia Penna, MD   Follow Up Instructions:     [phone] 5-10 [phone] 11-20 [id number] 21-30 I did not refer this patient for an OV in the next 24 hours for this/these issue(s).  I discussed the assessment and treatment plan with the patient. The patient was provided an opportunity to ask questions and all were answered. The patient agreed with the plan and demonstrated an understanding of the instructions.   The  patient was advised to call back or seek an in-person evaluation if the symptoms worsen or if the condition fails to improve as anticipated.  I provided 12 minutes of non-face-to-face time during this encounter.   Alysia Penna, MD     Review of Systems     Objective:   Physical Exam        Assessment & Plan:

## 2021-11-18 DIAGNOSIS — Z23 Encounter for immunization: Secondary | ICD-10-CM | POA: Diagnosis not present

## 2021-11-23 ENCOUNTER — Telehealth: Payer: Self-pay

## 2021-11-23 NOTE — Telephone Encounter (Signed)
Message sent to Dr Sarajane Jews for advise

## 2021-11-24 NOTE — Telephone Encounter (Signed)
Please call her pharmacy back and see if the meds can be filled now

## 2021-11-24 NOTE — Telephone Encounter (Signed)
Spoke with pt pharmacy states that they are not able to fill both Rx due to the MM equivalent in both Rx, The pharmacy tech stated that they did not have any Hydrocodone in stock, Spoke with pt advised per Dr Sarajane Jews to look for a pharmacy that has Rx in stock so Dr Sarajane Jews can sent a new Rx for pt, pt will call back tomorrow with information regarding new pharmacy.

## 2021-11-25 ENCOUNTER — Telehealth: Payer: Self-pay | Admitting: Family Medicine

## 2021-11-25 NOTE — Telephone Encounter (Signed)
Pt is returning Linda Kent call and she is aware Linda Kent is at lunch

## 2021-11-25 NOTE — Telephone Encounter (Signed)
Pt call and stated she need you to give her a call back about her medication

## 2021-11-26 NOTE — Telephone Encounter (Signed)
Pt called to request a refill of the following:  Oxycodone HCl 20 MG TABS   *PLEASE SEND TO: [facility] [place] - [address] AT [facility] Phone: [phone]  Fax: [phone]      methylphenidate (RITALIN) 10 MG tablet &  LORazepam (ATIVAN) 2 MG tablet   *PLEASE SEND TO: [facility] - [place], [place] Phone: [phone]  Fax: [phone]   LOV:  08/18/21

## 2021-11-27 NOTE — Telephone Encounter (Signed)
NO, we already sent in these refills. We cannot keep switching back and forth between pharmacies. I sent them where she asked me to send them during our last OV

## 2021-12-01 NOTE — Telephone Encounter (Signed)
Awaiting pt to call me back with information on what pharmacy has Hydrocodone in stock

## 2021-12-01 NOTE — Telephone Encounter (Signed)
Spoke with pt states that her pharmacy has no Hydrocodone in stock, advised [facility] and [facility] if [facility] supply for Rx and then call our office so we can send Rx to the phramacy

## 2021-12-01 NOTE — Telephone Encounter (Signed)
Called pt left a message for pt to call the office back

## 2021-12-02 MED ORDER — METHYLPHENIDATE HCL 10 MG PO TABS: 10.0000 mg | ORAL_TABLET | Freq: Four times a day (QID) | ORAL | 0 refills | Status: DC

## 2021-12-02 MED ORDER — HYDROCODONE-ACETAMINOPHEN 10-325 MG PO TABS: 1.0000 | ORAL_TABLET | Freq: Four times a day (QID) | ORAL | 0 refills | Status: DC | PRN

## 2021-12-02 NOTE — Telephone Encounter (Signed)
I sent in a 30 day supply of Hydrocodone and Methylphenidate to the [facility]

## 2021-12-02 NOTE — Telephone Encounter (Signed)
Pt call and stated she need methylphenidate (RITALIN) 10 MG tablet refill sent to [facility] and she stated she want the 20 mg and 60 tablet she stated that is all they have also want a call back.pt stated if you could do it today.

## 2021-12-02 NOTE — Telephone Encounter (Signed)
[facility]

## 2021-12-04 NOTE — Telephone Encounter (Signed)
Pt.notified

## 2021-12-29 ENCOUNTER — Telehealth (INDEPENDENT_AMBULATORY_CARE_PROVIDER_SITE_OTHER): Payer: Medicare Other | Admitting: Family Medicine

## 2021-12-29 ENCOUNTER — Encounter: Payer: Self-pay | Admitting: Family Medicine

## 2021-12-29 DIAGNOSIS — F1190 Opioid use, unspecified, uncomplicated: Secondary | ICD-10-CM

## 2021-12-29 DIAGNOSIS — M7918 Myalgia, other site: Secondary | ICD-10-CM | POA: Diagnosis not present

## 2021-12-29 MED ORDER — HYDROCODONE-ACETAMINOPHEN 10-325 MG PO TABS: 1.0000 | ORAL_TABLET | Freq: Four times a day (QID) | ORAL | 0 refills | Status: DC | PRN

## 2021-12-29 MED ORDER — METHYLPHENIDATE HCL 10 MG PO TABS: 10.0000 mg | ORAL_TABLET | Freq: Four times a day (QID) | ORAL | 0 refills | Status: DC

## 2021-12-29 MED ORDER — OXYCODONE HCL 20 MG PO TABS: 20.0000 mg | ORAL_TABLET | ORAL | 0 refills | Status: DC | PRN

## 2021-12-29 MED ORDER — LORAZEPAM 2 MG PO TABS: 2.0000 mg | ORAL_TABLET | Freq: Three times a day (TID) | ORAL | 5 refills | Status: DC

## 2021-12-29 NOTE — Progress Notes (Signed)
   Subjective:    Patient ID: Linda Kent, female    DOB: January 23, 1945, 76 y.o.   MRN: [id number]  HPI Virtual Visit via Telephone Note  I connected with the patient on 12/29/21 at 10:45 AM EST by telephone and verified that I am speaking with the correct person using two identifiers.   I discussed the limitations, risks, security and privacy concerns of performing an evaluation and management service by telephone and the availability of in person appointments. I also discussed with the patient that there may be a patient responsible charge related to this service. The patient expressed understanding and agreed to proceed.  Location patient: home Location provider: work or home office Participants present for the call: patient, provider Patient did not have a visit in the prior 7 days to address this/these issue(s).   History of Present Illness: Here for pain management. She is doing fairly well with her pain, but she asks if we can send the RX for Oxycodone [facility] [address] [place] [facility] [address].    Observations/Objective: Patient sounds cheerful and well on the phone. I do not appreciate any SOB. Speech and thought processing are grossly intact. Patient reported vitals:  Assessment and Plan: Pain management. Indication for chronic opioid: myofascial pain Medication and dose: Norco 10-325 and Oxycodone 20 mg # pills per month: 120 and 180 Last UDS date: 08-18-21 Opioid Treatment Agreement signed (Y/N): 04-06-17 Opioid Treatment Agreement last reviewed with patient:  12-29-21 [facility] reviewed this encounter (include red flags): Yes Meds were refilled as above.  Alysia Penna, MD   Follow Up Instructions:     [phone] 5-10 [phone] 11-20 [id number] 21-30 I did not refer this patient for an OV in the next 24 hours for this/these issue(s).  I discussed the assessment and treatment plan with the patient. The patient was provided an opportunity to ask  questions and all were answered. The patient agreed with the plan and demonstrated an understanding of the instructions.   The patient was advised to call back or seek an in-person evaluation if the symptoms worsen or if the condition fails to improve as anticipated.  I provided  18 minutes of non-face-to-face time during this encounter.   Alysia Penna, MD     Review of Systems     Objective:   Physical Exam        Assessment & Plan:

## 2022-01-04 ENCOUNTER — Telehealth: Payer: Self-pay

## 2022-01-04 MED ORDER — METHYLPHENIDATE HCL 10 MG PO TABS: 10.0000 mg | ORAL_TABLET | Freq: Four times a day (QID) | ORAL | 0 refills | Status: DC

## 2022-01-04 MED ORDER — OXYCODONE HCL 20 MG PO TABS: 20.0000 mg | ORAL_TABLET | ORAL | 0 refills | Status: AC | PRN

## 2022-01-04 MED ORDER — HYDROCODONE-ACETAMINOPHEN 10-325 MG PO TABS: 1.0000 | ORAL_TABLET | Freq: Four times a day (QID) | ORAL | 0 refills | Status: AC | PRN

## 2022-01-04 NOTE — Telephone Encounter (Signed)
Done

## 2022-01-04 NOTE — Addendum Note (Signed)
Addended by: Alysia Penna A on: 01/04/2022 05:16 PM   Modules accepted: Orders

## 2022-01-04 NOTE — Telephone Encounter (Signed)
Pt message sent to Dr Sarajane Jews for advise

## 2022-01-05 NOTE — Telephone Encounter (Signed)
Noted  

## 2022-01-13 ENCOUNTER — Other Ambulatory Visit: Payer: Self-pay | Admitting: Family Medicine

## 2022-01-13 NOTE — Telephone Encounter (Signed)
Patient calling to check on progress of this refill. Pt requests call to confirm

## 2022-01-13 NOTE — Telephone Encounter (Addendum)
Last VV-12/29/21 Last refill-08/18/21-90 tabs, 1 refill  No future OV scheduled.

## 2022-01-14 NOTE — Telephone Encounter (Signed)
Called patient left message with husband prescription was sent.

## 2022-01-19 ENCOUNTER — Telehealth: Payer: Self-pay | Admitting: Family Medicine

## 2022-01-19 MED ORDER — ZALEPLON 10 MG PO CAPS: 10.0000 mg | ORAL_CAPSULE | Freq: Every evening | ORAL | 1 refills | Status: DC | PRN

## 2022-01-19 NOTE — Telephone Encounter (Signed)
Left detailed message advising to pick up Rx from her pharmacy

## 2022-01-19 NOTE — Telephone Encounter (Signed)
Done

## 2022-01-19 NOTE — Telephone Encounter (Signed)
Correct   Patient wanted a call back in a couple weeks

## 2022-01-19 NOTE — Telephone Encounter (Signed)
Spoke with patient to schedule her AWV.  She stated she has [facility]  She needs a refill zaleplon  She stated she is out of her med

## 2022-01-19 NOTE — Telephone Encounter (Signed)
Left message for patient to call back and schedule Medicare Annual Wellness Visit (AWV) either virtually or in office. Left  my Herbie Drape number [phone]   Last AWV  10/13/20 please schedule with Nurse Health Adviser   45 min for awv-i and in office appointments 30 min for awv-s  phone/virtual appointments

## 2022-01-26 DIAGNOSIS — E039 Hypothyroidism, unspecified: Secondary | ICD-10-CM | POA: Diagnosis not present

## 2022-01-26 DIAGNOSIS — M810 Age-related osteoporosis without current pathological fracture: Secondary | ICD-10-CM | POA: Diagnosis not present

## 2022-01-26 DIAGNOSIS — E559 Vitamin D deficiency, unspecified: Secondary | ICD-10-CM | POA: Diagnosis not present

## 2022-01-26 DIAGNOSIS — Z79890 Hormone replacement therapy (postmenopausal): Secondary | ICD-10-CM | POA: Diagnosis not present

## 2022-02-01 ENCOUNTER — Other Ambulatory Visit: Payer: Self-pay | Admitting: Family Medicine

## 2022-02-09 ENCOUNTER — Telehealth (INDEPENDENT_AMBULATORY_CARE_PROVIDER_SITE_OTHER): Payer: Medicare Other | Admitting: Family Medicine

## 2022-02-09 DIAGNOSIS — Z0000 Encounter for general adult medical examination without abnormal findings: Secondary | ICD-10-CM | POA: Diagnosis not present

## 2022-02-09 NOTE — Patient Instructions (Addendum)
I really enjoyed getting to talk with you today! I am available on Tuesdays and Thursdays for virtual visits if you have any questions or concerns, or if I can be of any further assistance.   CHECKLIST FROM ANNUAL WELLNESS VISIT:  -Follow up (please call to schedule if not scheduled after visit):  -Inperson visit with your Primary Doctor office: in 3-4 months -yearly for annual wellness visit with primary care office  Here is a list of your preventive care/health maintenance measures and the plan for each if any are due:  Health Maintenance  Topic Date Due   Hepatitis C Screening  Never done   DTaP/Tdap/Td (2 - Tdap) 09/25/2017   Zoster Vaccines- Shingrix (1 of 2) 05/11/2022 (Originally 09/02/1993)   Pneumonia Vaccine 33+ Years old (3 - PPSV23 or PCV20) 09/18/2022 (Originally 06/10/2020)   Medicare Annual Wellness (AWV)  02/10/2023   INFLUENZA VACCINE  Completed   DEXA SCAN  Completed   COVID-19 Vaccine  Completed   HPV VACCINES  Aged Out   COLONOSCOPY (Pts 45-39yr Insurance coverage will need to be confirmed)  Discontinued    -See a dentist at least yearly  -Get your eyes checked and then per your eye specialist's recommendations  -Other issues addressed today:   -I have included below further information regarding a healthy whole foods based diet, physical activity guidelines for adults, stress management and opportunities for social connections. I hope you find this information useful.     NUTRITION: -eat real food: lots of colorful vegetables (half the plate) and fruits -5-7 servings of vegetables and fruits per day (fresh or steamed is best), exp. 2 servings of vegetables with lunch and dinner and 2 servings of fruit per day.  Berries and greens such as kale and collards are great choices.  -consume on a regular basis: whole grains (make sure first ingredient on label contains the word "whole"), fresh fruits, fish, nuts, seeds, healthy oils (such as olive oil, avocado oil, grape seed oil) -may eat small amounts of dairy and lean meat on occasion, but avoid processed meats such as ham, bacon, lunch meat, etc. -drink water -try to avoid fast food and pre-packaged foods, processed meat -most experts advise limiting sodium to < '2300mg'$  per day, should limit further is any chronic conditions such as high blood pressure, heart disease, diabetes, etc. The American Heart Association advised that < '1500mg'$  is is ideal -try to avoid foods that contain any ingredients with names you do not recognize  -try to avoid sugar/sweets (except for the natural sugar that occurs in fresh fruit) -try to avoid sweet drinks -try to avoid white rice, white bread, pasta (unless whole grain), white or yellow potatoes  EXERCISE GUIDELINES FOR ADULTS: -if you wish to increase your physical activity, do so gradually and with the approval of your doctor -STOP and seek medical care immediately if you have any chest pain, chest discomfort or trouble breathing when starting or increasing exercise  -move and stretch your body, legs, feet and arms when sitting for long periods -Physical activity guidelines for optimal health in adults: -least 150 minutes per week of aerobic exercise (can talk, but not sing) once approved by your doctor, 20-30 minutes of sustained activity or two 10 minute episodes of sustained activity every day.  -resistance training at least 2 days per week if approved by your doctor -balance exercises 3+ days per week:   Stand somewhere where you have something sturdy to hold onto if you lose balance.  1) lift up on toes, start with 5x per day and work up to 20x   2) stand and lift on leg straight out to the side so that foot is a few  inches of the floor, start with 5x each side and work up to 20x each side   3) stand on one foot, start with 5 seconds each side and work up to 20 seconds on each side  If you need ideas or help with getting more active:  -Silver sneakers https://tools.silversneakers.com  -Walk with a Doc: http://stephens-thompson.biz/  -try to include resistance (weight lifting/strength building) and balance exercises twice per week: or the following link for ideas: ChessContest.fr  UpdateClothing.com.cy  STRESS MANAGEMENT: -can try meditating, or just sitting quietly with deep breathing while intentionally relaxing all parts of your body for 5 minutes daily -if you need further help with stress, anxiety or depression please follow up with your primary doctor or contact the wonderful folks at [place]: [place]: -options in [place] if you wish to engage in more social and exercise related activities:  -Silver sneakers https://tools.silversneakers.com  -Walk with a Doc: http://stephens-thompson.biz/  -Check out the [place] 50+ section on the [place] of [employer] (hiking clubs, book clubs, cards and games, chess, exercise classes, aquatic classes and much more) - see the website for details: https://www.[place].gov/departments/parks-recreation/active-adults50  -YouTube has lots of exercise videos for different ages and abilities as well  -[place] (a variety of indoor and outdoor inperson activities for adults). [phone]. [address].  -Virtual Online Classes (a variety of topics): see seniorplanet.org or call [phone]  -consider volunteering at a school, hospice center, church, senior center or elsewhere

## 2022-02-09 NOTE — Progress Notes (Signed)
PATIENT CHECK-IN and HEALTH RISK ASSESSMENT QUESTIONNAIRE:  -completed by phone/video for upcoming Medicare Preventive Visit  Pre-Visit Check-in: 1)Vitals (height, wt, BP, etc) - record in vitals section for visit on day of visit 2)Review and Update Medications, Allergies PMH, Surgeries, Social history in Epic 3)Hospitalizations in the last year with date/reason?   4)Review and Update Care Team (patient's specialists) in Epic 5) Complete PHQ9 in Epic  6) Complete Fall Screening in Epic 7)Review all Health Maintenance Due and order under PCP if not done.  8)Medicare Wellness Questionnaire: Answer theses question about your habits: Do you drink alcohol? no If yes, how many drinks do you have a day? Have you ever smoked?no Quit date if applicable? N/a  How many packs a day do/did you smoke? N/a Do you use smokeless tobacco?no Do you use an illicit drugs?no Do you exercises? No - she reports she is not able to exercise because of pain, she does force herself to get up and around the house.  Are you sexually active? No Number of partners? Typical breakfast: oatmeal with blueberry Typical lunch: crackers-whole wheat with sea salt Typical dinner: soup banana sandwich Typical snacks: cottage cheese with pineapple  Beverages: water, protein drink, decaf tea  Answer theses question about you: Can you perform most household chores?no Do you find it hard to follow a conversation in a noisy room?no Do you often ask people to speak up or repeat themselves?yes, but doing ok Do you feel that you have a problem with memory?yes, some Do you balance your checkbook and or bank acounts?no husband does it Do you feel safe at home?yes Last dentist visit? This month, Dr. Lavella Hammock Do you need assistance with any of the following: Please note if so   Driving? yes  Feeding yourself? no  Getting from bed to chair?no  Getting to the toilet?no  Bathing or showering?yes  Dressing  yourself?yes  Managing money?yes  Climbing a flight of stairs yes  Preparing meals? Husband does it  Do you have Advanced Directives in place (Living Will, Healthcare Power or [place])? Yes, Living Will   Last eye Exam and location?Last year at the [facility] on [address]. Unable to recall the date and states everything was okay. Got new glasses.    Do you currently use prescribed or non-prescribed narcotic or opioid pain medications?Yes, Oxycodone and as needed for hydrocodone, no side effects so far, managed by pcp  Do you have a history or close family history of breast, ovarian, tubal or peritoneal cancer or a family member with BRCA (breast cancer susceptibility 1 and 2) gene mutations? Maternal aunt had ovarian cancer. Another maternal aunt had leukemia. Maternal cousin had colon cancer. Paternal uncle died from colon cancer.   Nurse/Assistant Credentials/time stamp:Karpuih M./CMA/5:03pm   ----------------------------------------------------------------------------------------------------------------------------------------------------------------------------------------------------------------------   MEDICARE ANNUAL PREVENTIVE VISIT WITH PROVIDER: (Welcome to [employer], initial annual wellness or annual wellness exam)  Virtual Visit via Phone Note  I connected with Linda Kent  on 02/09/22 by phone  a video enabled telemedicine application and verified that I am speaking with the correct person using two identifiers.  Location patient: home Location provider:work or home office Persons participating in the virtual visit: patient, provider  Concerns and/or follow up today: none. She struggles with chronic pain - unchanged. Reports the pain medications take the edge off the pain and she could not live without them. Is appreciative of her PCP for prescribing. She also uses the tens unit which helps. Has not been able to exercise due to the chronic  pain.    See HM section in Epic for  other details of completed HM.    ROS: negative for report of fevers, unintentional weight loss, vision changes, vision loss, hearing loss or change, chest pain, sob, hemoptysis, melena, hematochezia, hematuria, genital discharge or lesions, falls, bleeding or bruising, loc, thoughts of suicide or self harm, memory loss  Patient-completed extensive health risk assessment - reviewed and discussed with the patient: See Health Risk Assessment completed with patient prior to the visit either above or in recent phone note. This was reviewed in detailed with the patient today and appropriate recommendations, orders and referrals were placed as needed per Summary below and patient instructions.   Review of Medical History: -PMH, PSH, Family History and current specialty and care providers reviewed and updated and listed below   Patient Care Team: Laurey Morale, MD as PCP - General (Family Medicine)   Past Medical History:  Diagnosis Date   Acne rosacea    ADD (attention deficit disorder)    Back pain    Chronic insomnia    Colon polyps    Depression    Hyperlipidemia    Hypothyroidism    sees Dr. Elmarie Shiley    IBS (irritable bowel syndrome)    Migraines    sees Dr. Orie Rout    Myofascial pain syndrome    sees Dr. Elta Guadeloupe Philips    Osteoporosis    sees Dr. Elmarie Shiley    Post menopausal syndrome    Recurrent cystitis    after intercourse, controlled with macrobid    Routine gynecological examination    sees [facility]     Past Surgical History:  Procedure Laterality Date   ABDOMINAL HYSTERECTOMY     [facility] for prolapse   APPENDECTOMY     COLONOSCOPY  2017   per Dr. Amedeo Plenty, adenomatous polyps, repeat in 5 yrs    OVARIAN CYST REMOVAL     excised on left    TONSILLECTOMY     UPPER GASTROINTESTINAL ENDOSCOPY      Social History   Socioeconomic History   Marital status: Married    Spouse name: Not on file   Number of children: Not on file   Years of education:  Not on file   Highest education level: Not on file  Occupational History   Not on file  Tobacco Use   Smoking status: Never   Smokeless tobacco: Never  Substance and Sexual Activity   Alcohol use: No    Alcohol/week: 0.0 standard drinks of alcohol   Drug use: No   Sexual activity: Yes  Other Topics Concern   Not on file  Social History Narrative   Not on file   Social Determinants of Health   Financial Resource Strain: Low Risk  (10/13/2020)   Overall Financial Resource Strain (CARDIA)    Difficulty of Paying Living Expenses: Not hard at all  Food Insecurity: No Food Insecurity (10/13/2020)   Hunger Vital Sign    Worried About Running Out of Food in the Last Year: Never true    [place] in the Last Year: Never true  Transportation Needs: No Transportation Needs (10/13/2020)   PRAPARE - Hydrologist (Medical): No    Lack of Transportation (Non-Medical): No  Physical Activity: Inactive (10/13/2020)   Exercise Vital Sign    Days of Exercise per Week: 0 days    Minutes of Exercise per Session: 0 min  Stress: No Stress Concern  Present (10/13/2020)   [place]    Feeling of Stress : Not at all  Social Connections: Moderately Isolated (10/13/2020)   Social Connection and Isolation Panel [NHANES]    Frequency of Communication with Friends and Family: Three times a week    Frequency of Social Gatherings with Friends and Family: Three times a week    Attends Religious Services: Never    Active Member of Clubs or Organizations: No    Attends Archivist Meetings: Never    Marital Status: Married  Human resources officer Violence: Not At Risk (10/13/2020)   Humiliation, Afraid, Rape, and Kick questionnaire    Fear of Current or Ex-Partner: No    Emotionally Abused: No    Physically Abused: No    Sexually Abused: No    Family History  Problem Relation Age of Onset   Other Sister         low serotonin levels   Colon polyps Sister    Other Brother        low serotonin levels    Colon cancer Paternal Uncle    Colon cancer Other    Cancer Other 78       puncle decreased from colon cancer    Rectal cancer Neg Hx    Stomach cancer Neg Hx     Current Outpatient Medications on File Prior to Visit  Medication Sig Dispense Refill   Ascorbic Acid (VITAMIN C) 1000 MG tablet Take 1,000 mg by mouth 2 (two) times daily. Pt taking 5,000 mg     aspirin 81 MG EC tablet Take 81 mg by mouth daily.     Calcium 1500 MG tablet Take 1,500 mg by mouth daily. With Vitamin D     denosumab (PROLIA) 60 MG/ML SOLN injection Inject 60 mg into the skin every 6 (six) months. Administer in upper arm, thigh, or abdomen     diphenhydrAMINE (BENADRYL) 25 mg capsule Take 25 mg by mouth every 6 (six) hours as needed.     halobetasol (ULTRAVATE) 0.05 % cream Apply topically 2 (two) times daily. 50 g 5   HYDROcodone-acetaminophen (NORCO) 10-325 MG tablet Take 1 tablet by mouth 4 (four) times daily as needed.     levothyroxine (SYNTHROID) 75 MCG tablet Take 75 mcg by mouth daily. Brand name only     LORazepam (ATIVAN) 2 MG tablet Take 1 tablet (2 mg total) by mouth 3 (three) times daily. (Patient taking differently: Take 2 mg by mouth 3 (three) times daily. PRN) 90 tablet 5   Magnesium 100 MG TABS Take 1 tablet by mouth daily at 6 (six) AM. Taking 400 mg daily     meclizine (ANTIVERT) 25 MG tablet Take 1 tablet (25 mg total) by mouth every 4 (four) hours as needed for dizziness. 30 tablet 5   metroNIDAZOLE (METROCREAM) 0.75 % cream Apply 1 Application topically daily. 180 g 3   Multiple Vitamin (MULTIVITAMIN) tablet Take 1 tablet by mouth daily. Centrum 50 +     NONFORMULARY OR COMPOUNDED ITEM APPLY A DIME SIZED AMOUNT TO AFFECTED AREA 4 TIMES DAILY AS NEEDED. 1 each 6   Oxycodone HCl 20 MG TABS Take by mouth.     perphenazine (TRILAFON) 4 MG tablet TAKE 1 TABLET BY MOUTH EVERY 4 HOURS AS NEEDED FOR  MIGRAINES 30 tablet 0   Potassium Gluconate 550 MG TABS Take 1 tablet by mouth daily.     Probiotic Product (ALIGN PO) Take 1  capsule by mouth daily.      rizatriptan (MAXALT) 10 MG tablet TAKE 1 TABLET BY MOUTH AS NEEDED FOR MIGRAINE 10 tablet 3   topiramate (TOPAMAX) 50 MG tablet Take 3 tablets (150 mg total) by mouth daily. 270 tablet 3   zaleplon (SONATA) 10 MG capsule Take 1 capsule (10 mg total) by mouth at bedtime as needed. for sleep 90 capsule 1   Melatonin 3 MG CAPS Take 9 mg by mouth at bedtime. (Patient not taking: Reported on 02/09/2022)     methylphenidate (RITALIN) 10 MG tablet Take 1 tablet (10 mg total) by mouth in the morning, at noon, in the evening, and at bedtime. 120 tablet 0   No current facility-administered medications on file prior to visit.    Allergies  Allergen Reactions   Ezetimibe Other (See Comments)    Difficulty breathing   Atorvastatin Calcium [Atorvastatin]     Caused chest pain, labored breathing, fatigue    Crestor [Rosuvastatin Calcium] Other (See Comments)    Chest pain, labored breathing   Cephalexin Rash       Physical Exam There were no vitals filed for this visit. Estimated body mass index is 22.66 kg/m as calculated from the following:   Height as of 04/07/21: '5\' 4"'$  (1.626 m).   Weight as of 08/18/21: 132 lb (59.9 kg).  EKG (optional): deferred due to virtual visit  GENERAL: alert, oriented, no acute distress detected, full vision exam deferred due to pandemic and/or virtual encounter PSYCH/NEURO: pleasant and cooperative, no obvious depression or anxiety, speech and thought processing grossly intact, Cognitive function grossly intact  Flowsheet Row Video Visit from 02/09/2022 in [facility] [place]  PHQ-9 Total Score 7           02/09/2022    4:48 PM 08/18/2021    4:48 PM 07/20/2021    1:30 PM 10/13/2020    2:03 PM 10/10/2019    1:47 PM  Depression screen PHQ 2/9  Decreased Interest 1 0 0 0 0  Down,  Depressed, Hopeless 0 0 0 0 1  PHQ - 2 Score 1 0 0 0 1  Altered sleeping 3 1 0  0  Tired, decreased energy 3 2 0  0  Change in appetite 0 0 0  0  Feeling bad or failure about yourself  0 0 0  0  Trouble concentrating 0 0 0  0  Moving slowly or fidgety/restless 0 0 0  0  Suicidal thoughts 0 0 0  0  PHQ-9 Score 7 3 0  1  Difficult doing work/chores  Not difficult at all Not difficult at all  Not difficult at all       10/13/2020    2:03 PM 12/20/2020    4:43 PM 07/20/2021    1:30 PM 08/18/2021    4:47 PM 02/09/2022    4:48 PM  Fall Risk  Falls in the past year? 0  0 1 1  Was there an injury with Fall? 0  0 1 1  Fall Risk Category Calculator 0  0 3 2  Fall Risk Category (Retired) Low  Low High   (RETIRED) Patient Fall Risk Level Low fall risk Low fall risk Low fall risk Moderate fall risk   Patient at Risk for Falls Due to   No Fall Risks History of fall(s) Other (Comment)  Fall risk Follow up Falls evaluation completed  Falls evaluation completed Falls evaluation completed Falls evaluation completed   She is  using caution with ambulating - being very very careful not to fall again. Uses walker when goes out.   SUMMARY AND PLAN:  Medicare annual wellness visit, subsequent   Discussed applicable health maintenance/preventive health measures and advised and referred or ordered per patient preferences:  Health Maintenance  Topic Date Due   Hepatitis C Screening  Never done, discussed   DTaP/Tdap/Td (2 - Tdap) 09/25/2017, she plans to consider and get with PCP at visit if desires.    Zoster Vaccines- Shingrix (1 of 2) 05/11/2022 (Originally 09/02/1993), declined   Pneumonia Vaccine 33+ Years old (3 - PPSV23 or PCV20) 09/18/2022 (Originally 06/10/2020) declined for now   Medicare Annual Wellness (AWV)  02/10/2023   INFLUENZA VACCINE  Completed   DEXA SCAN  Completed   COVID-19 Vaccine  Completed   HPV VACCINES  Aged Out   COLONOSCOPY (Pts 45-22yr Insurance coverage will need to be  confirmed)  Discontinued     Education and counseling on the following was provided based on the above review of health and a plan/checklist for the patient, along with additional information discussed, was provided for the patient in the patient instructions :    -Provided counseling and plan for increased risk of falling if applicable per above screening. Discussed caution, walker, cane, ensuring floors are clear, safe balance exercises.  -Advised and counseled on maintaining healthy weight and healthy lifestyle - including the importance of a health diet, regular physical activity, social connections and stress management. -Advised and counseled on a whole foods based healthy diet and regular exercise. A summary of a healthy diet was provided in the Patient Instructions -discussed gentle movements she could consider to improve mobility if tolerated -Advise yearly dental visits at minimum and regular eye exams   Follow up: see patient instructions     Patient Instructions  I really enjoyed getting to talk with you today! I am available on Tuesdays and Thursdays for virtual visits if you have any questions or concerns, or if I can be of any further assistance.   CHECKLIST FROM ANNUAL WELLNESS VISIT:  -Follow up (please call to schedule if not scheduled after visit):  -Inperson visit with your Primary Doctor office: in 3-4 months -yearly for annual wellness visit with primary care office  Here is a list of your preventive care/health maintenance measures and the plan for each if any are due:  Health Maintenance  Topic Date Due   Hepatitis C Screening  Never done   DTaP/Tdap/Td (2 - Tdap) 09/25/2017   Zoster Vaccines- Shingrix (1 of 2) 05/11/2022 (Originally 09/02/1993)   Pneumonia Vaccine 79 Years old (3 - PPSV23 or PCV20) 09/18/2022 (Originally 06/10/2020)   Medicare Annual Wellness (AWV)  02/10/2023   INFLUENZA VACCINE  Completed   DEXA SCAN  Completed   COVID-19 Vaccine   Completed   HPV VACCINES  Aged Out   COLONOSCOPY (Pts 45-442yrInsurance coverage will need to be confirmed)  Discontinued    -See a dentist at least yearly  -Get your eyes checked and then per your eye specialist's recommendations  -Other issues addressed today:   -I have included below further information regarding a healthy whole foods based diet, physical activity guidelines for adults, stress management and opportunities for social connections. I hope you find this information useful.     NUTRITION: -eat real food: lots of colorful vegetables (half the plate) and fruits -5-7 servings of vegetables and fruits per day (fresh or steamed is best), exp. 2 servings of vegetables  with lunch and dinner and 2 servings of fruit per day. Berries and greens such as kale and collards are great choices.  -consume on a regular basis: whole grains (make sure first ingredient on label contains the word "whole"), fresh fruits, fish, nuts, seeds, healthy oils (such as olive oil, avocado oil, grape seed oil) -may eat small amounts of dairy and lean meat on occasion, but avoid processed meats such as ham, bacon, lunch meat, etc. -drink water -try to avoid fast food and pre-packaged foods, processed meat -most experts advise limiting sodium to < '2300mg'$  per day, should limit further is any chronic conditions such as high blood pressure, heart disease, diabetes, etc. The American Heart Association advised that < '1500mg'$  is is ideal -try to avoid foods that contain any ingredients with names you do not recognize  -try to avoid sugar/sweets (except for the natural sugar that occurs in fresh fruit) -try to avoid sweet drinks -try to avoid white rice, white bread,  pasta (unless whole grain), white or yellow potatoes  EXERCISE GUIDELINES FOR ADULTS: -if you wish to increase your physical activity, do so gradually and with the approval of your doctor -STOP and seek medical care immediately if you have any chest pain, chest discomfort or trouble breathing when starting or increasing exercise  -move and stretch your body, legs, feet and arms when sitting for long periods -Physical activity guidelines for optimal health in adults: -least 150 minutes per week of aerobic exercise (can talk, but not sing) once approved by your doctor, 20-30 minutes of sustained activity or two 10 minute episodes of sustained activity every day.  -resistance training at least 2 days per week if approved by your doctor -balance exercises 3+ days per week:   Stand somewhere where you have something sturdy to hold onto if you lose balance.    1) lift up on toes, start with 5x per day and work up to 20x   2) stand and lift on leg straight out to the side so that foot is a few inches of the floor, start with 5x each side and work up to 20x each side   3) stand on one foot, start with 5 seconds each side and work up to 20 seconds on each side  If you need ideas or help with getting more active:  -Silver sneakers https://tools.silversneakers.com  -Walk with a Doc: http://stephens-thompson.biz/  -try to include resistance (weight lifting/strength building) and balance exercises twice per week: or the following link for ideas: ChessContest.fr  UpdateClothing.com.cy  STRESS MANAGEMENT: -can try meditating, or just sitting quietly with deep breathing while intentionally relaxing all parts of your body for 5 minutes daily -if you need further help with stress, anxiety or depression please follow up with your primary doctor or contact the wonderful folks at [place]:  [place]: -options in [place] if you wish to engage in more social and exercise related activities:  -Silver sneakers https://tools.silversneakers.com  -Walk with a Doc: http://stephens-thompson.biz/  -Check out the [place] 50+ section on the [place] [employer] (hiking clubs, book clubs, cards and games, chess, exercise classes, aquatic classes and much more) - see the website for details: https://www.[place].gov/departments/parks-recreation/active-adults50  -YouTube has lots of exercise videos for different ages and abilities as well  -[place] (a variety of indoor and outdoor inperson activities for adults). 712-168-1897. [address]  -Virtual Online Classes (a variety of topics): see seniorplanet.org or call [phone]  -consider volunteering  at a school, hospice center, church, senior center or elsewhere           Lucretia Kern, DO

## 2022-03-12 ENCOUNTER — Telehealth: Payer: Medicare Other | Admitting: Family Medicine

## 2022-03-15 ENCOUNTER — Ambulatory Visit (INDEPENDENT_AMBULATORY_CARE_PROVIDER_SITE_OTHER): Payer: Medicare Other | Admitting: Family Medicine

## 2022-03-15 ENCOUNTER — Encounter: Payer: Self-pay | Admitting: Family Medicine

## 2022-03-15 VITALS — BP 102/80 | HR 106 | Temp 97.5°F | Wt 134.6 lb

## 2022-03-15 DIAGNOSIS — M7918 Myalgia, other site: Secondary | ICD-10-CM | POA: Diagnosis not present

## 2022-03-15 DIAGNOSIS — F1190 Opioid use, unspecified, uncomplicated: Secondary | ICD-10-CM

## 2022-03-15 MED ORDER — OXYCODONE HCL 20 MG PO TABS: 20.0000 mg | ORAL_TABLET | ORAL | 0 refills | Status: DC | PRN

## 2022-03-15 MED ORDER — HYDROCODONE-ACETAMINOPHEN 10-325 MG PO TABS: 1.0000 | ORAL_TABLET | Freq: Four times a day (QID) | ORAL | 0 refills | Status: DC | PRN

## 2022-03-15 MED ORDER — METHYLPHENIDATE HCL 10 MG PO TABS: 10.0000 mg | ORAL_TABLET | Freq: Four times a day (QID) | ORAL | 0 refills | Status: DC

## 2022-03-15 MED ORDER — HALOBETASOL PROPIONATE 0.05 % EX CREA: TOPICAL_CREAM | Freq: Two times a day (BID) | CUTANEOUS | 5 refills | Status: DC

## 2022-03-15 NOTE — Progress Notes (Signed)
   Subjective:    Patient ID: Linda Kent, female    DOB: Oct 25, 1945, 77 y.o.   MRN: [place]:[id number]  HPI Here for pain management. Her pain levels are about the same as before, though she always has a harder time during the cold months.    Review of Systems  Constitutional: Negative.   Musculoskeletal:  Positive for myalgias.       Objective:   Physical Exam Constitutional:      Comments: Walks with a walker   Neurological:     Mental Status: She is alert.           Assessment & Plan:  Pain management.  Indication for chronic opioid: myofascial pain Medication and dose: Norco 10-325 and Oxycodone 20 mg  # pills per month: 120 and 180 Last UDS date: 08-18-21 Opioid Treatment Agreement signed (Y/N): 04-06-17 Opioid Treatment Agreement last reviewed with patient:  03-15-22 NCCSRS reviewed this encounter (include red flags): Yes Meds were refilled.  Linda Penna, MD

## 2022-05-18 ENCOUNTER — Telehealth: Payer: Self-pay | Admitting: Family Medicine

## 2022-05-18 NOTE — Telephone Encounter (Signed)
[facility] called to ask why Pt was prescribed both Hydrocodone & Oxycodone?  Pharmacist is saying Pt should not be taking both.  Please call [phone]- ask for Pharmacist Ben or Catelyn - to discuss.

## 2022-05-19 ENCOUNTER — Telehealth: Payer: Self-pay | Admitting: Family Medicine

## 2022-05-19 NOTE — Telephone Encounter (Signed)
Pt is calling and would like dr fry to call her concerning her pain medications

## 2022-05-19 NOTE — Telephone Encounter (Signed)
I understand. Set up an OV with Norah so we can decide our next course of action

## 2022-05-19 NOTE — Telephone Encounter (Signed)
Tell them that we have been doing this successfully for her for years now. She alternates the medications because they are more effective for her that way. She is part of a pain management program, and we monitor her medications closely

## 2022-05-19 NOTE — Telephone Encounter (Signed)
Spoke with Linda Kent advised to schedule OV per Dr Clent Ridges schedule for 05/25/22 at 1 pm

## 2022-05-19 NOTE — Telephone Encounter (Signed)
Spoke with pt pharmacy regarding pt taking both Hydrocodone and Oxycodone, pharmacy agent stated that they will not fill the 2 prescription together, advised Dr Clent Ridges to cancel one and let pt know that pharmacy will not be responsible for filling both. Pharmacist  stated that the [facility] has audited them and are concerned

## 2022-05-25 ENCOUNTER — Ambulatory Visit (INDEPENDENT_AMBULATORY_CARE_PROVIDER_SITE_OTHER): Payer: Medicare Other | Admitting: Family Medicine

## 2022-05-25 ENCOUNTER — Encounter: Payer: Self-pay | Admitting: Family Medicine

## 2022-05-25 VITALS — BP 102/78 | HR 94 | Temp 97.7°F | Wt 130.0 lb

## 2022-05-25 DIAGNOSIS — F1190 Opioid use, unspecified, uncomplicated: Secondary | ICD-10-CM | POA: Diagnosis not present

## 2022-05-25 DIAGNOSIS — M7918 Myalgia, other site: Secondary | ICD-10-CM | POA: Diagnosis not present

## 2022-05-25 MED ORDER — OXYCODONE HCL 20 MG PO TABS: 20.0000 mg | ORAL_TABLET | ORAL | 0 refills | Status: DC | PRN

## 2022-05-25 MED ORDER — METHYLPHENIDATE HCL 10 MG PO TABS: 10.0000 mg | ORAL_TABLET | Freq: Two times a day (BID) | ORAL | 0 refills | Status: DC

## 2022-05-25 MED ORDER — HYDROCODONE-ACETAMINOPHEN 10-325 MG PO TABS: 1.0000 | ORAL_TABLET | Freq: Four times a day (QID) | ORAL | 0 refills | Status: DC | PRN

## 2022-05-25 NOTE — Progress Notes (Signed)
   Subjective:    Patient ID: Linda Kent, female    DOB: 08/07/1945, 77 y.o.   MRN: [id number]  HPI Here for pain management. She has had a rough couple of months as far as her pain control goes.    Review of Systems  Constitutional: Negative.   Musculoskeletal:  Positive for myalgias.       Objective:   Physical Exam Constitutional:      Comments: In a wheelchair   Neurological:     Mental Status: She is alert.           Assessment & Plan:  Pain management. Indication for chronic opioid: myofascial pain Medication and dose: Norco 10-325 and Oxycodone 20 mg # pills per month: 120 and 180 Last UDS date: 08-18-21 Opioid Treatment Agreement signed (Y/N): 04-06-17 Opioid Treatment Agreement last reviewed with patient:  05-25-22 NCCSRS reviewed this encounter (include red flags): Yes Meds were refilled. Gershon Crane, MD

## 2022-05-25 NOTE — Telephone Encounter (Signed)
[facility] called to request that MD's previous message/response be faxed in writing to the [facility].  Please advise.   [facility] [place], [place] - [address] (Ph: [phone])

## 2022-05-26 NOTE — Telephone Encounter (Signed)
Spoke with pt pharmacy states that they need a statement in writing on pt regarding the two different control medications she is taking. Stated that it should explain that pt is on Pain Management program, is being monitored for drug screening, that she alternates taking the two medication and that the quantity pt is taking is needed for her diagnoses

## 2022-05-27 NOTE — Telephone Encounter (Signed)
Pt letter faxed to [facility] pharmacy this morning

## 2022-05-27 NOTE — Telephone Encounter (Signed)
The letter is ready  

## 2022-07-29 DIAGNOSIS — M810 Age-related osteoporosis without current pathological fracture: Secondary | ICD-10-CM | POA: Diagnosis not present

## 2022-08-12 ENCOUNTER — Telehealth: Payer: Self-pay | Admitting: Family Medicine

## 2022-08-12 ENCOUNTER — Other Ambulatory Visit: Payer: Self-pay

## 2022-08-12 MED ORDER — ZALEPLON 10 MG PO CAPS: 10.0000 mg | ORAL_CAPSULE | Freq: Every evening | ORAL | 1 refills | Status: DC | PRN

## 2022-08-12 NOTE — Telephone Encounter (Signed)
Prescription Request  08/12/2022  LOV: 05/25/2022  What is the name of the medication or equipment? topiramate (TOPAMAX) 50 MG tablet . Pt has new pharm  No   Which pharmacy would you like this sent to?   [facility] [place], [place] - [address] [facility] [address] [place] [address] Phone: [phone] Fax: [phone]    Patient notified that their request is being sent to the clinical staff for review and that they should receive a response within 2 business days.   Please advise at Mobile There is no such number on file (mobile).

## 2022-08-12 NOTE — Telephone Encounter (Signed)
Prescription Request  08/12/2022  LOV: 05/25/2022  What is the name of the medication or equipment? zaleplon (SONATA) 10 MG capsule  Have you contacted your pharmacy to request a refill? Yes   Which pharmacy would you like this sent to?   [facility] [place], [place] - [address] [facility] [address] [place] [address] Phone: [phone] Fax: [phone]    Patient notified that their request is being sent to the clinical staff for review and that they should receive a response within 2 business days.   Please advise at Mobile There is no such number on file (mobile).

## 2022-08-13 MED ORDER — ZALEPLON 10 MG PO CAPS: 10.0000 mg | ORAL_CAPSULE | Freq: Every evening | ORAL | 1 refills | Status: DC | PRN

## 2022-08-13 NOTE — Telephone Encounter (Signed)
Done

## 2022-08-25 ENCOUNTER — Telehealth: Payer: Self-pay | Admitting: Family Medicine

## 2022-08-25 NOTE — Telephone Encounter (Signed)
Noted  

## 2022-08-25 NOTE — Telephone Encounter (Signed)
Pt has been scheduled for a VV on 09/01/22.  Pt is requesting a phone call, stating she cannot come into the office,  and is not tech savvy enough for a virtual visit.   Pt says she is willing to pay out of pocket for this visit. Pt would like to know how much a 'phone visit' will cost her?  Please return Pt's call, at your earliest convenience.

## 2022-08-30 ENCOUNTER — Telehealth: Payer: Medicare Other | Admitting: Family Medicine

## 2022-09-01 ENCOUNTER — Encounter: Payer: Self-pay | Admitting: Family Medicine

## 2022-09-01 ENCOUNTER — Telehealth (INDEPENDENT_AMBULATORY_CARE_PROVIDER_SITE_OTHER): Payer: Medicare Other | Admitting: Family Medicine

## 2022-09-01 DIAGNOSIS — K580 Irritable bowel syndrome with diarrhea: Secondary | ICD-10-CM | POA: Diagnosis not present

## 2022-09-01 DIAGNOSIS — M7918 Myalgia, other site: Secondary | ICD-10-CM

## 2022-09-01 DIAGNOSIS — F1190 Opioid use, unspecified, uncomplicated: Secondary | ICD-10-CM | POA: Diagnosis not present

## 2022-09-01 MED ORDER — METHYLPHENIDATE HCL 10 MG PO TABS: 10.0000 mg | ORAL_TABLET | Freq: Two times a day (BID) | ORAL | 0 refills | Status: DC

## 2022-09-01 MED ORDER — OXYCODONE HCL 20 MG PO TABS: 20.0000 mg | ORAL_TABLET | ORAL | 0 refills | Status: DC | PRN

## 2022-09-01 MED ORDER — HYDROCODONE-ACETAMINOPHEN 10-325 MG PO TABS: 1.0000 | ORAL_TABLET | Freq: Four times a day (QID) | ORAL | 0 refills | Status: DC | PRN

## 2022-09-01 MED ORDER — DIPHENOXYLATE-ATROPINE 2.5-0.025 MG PO TABS: 2.0000 | ORAL_TABLET | Freq: Four times a day (QID) | ORAL | 5 refills | Status: DC | PRN

## 2022-09-01 NOTE — Progress Notes (Signed)
Subjective:    Patient ID: Linda Kent, female    DOB: 12/25/1945, 77 y.o.   MRN: [id number]  HPI Virtual Visit via Video Note  I connected with the patient on 09/01/22 at  2:00 PM EDT by a video enabled telemedicine application and verified that I am speaking with the correct person using two identifiers.  Location patient: home Location provider:work or home office Persons participating in the virtual visit: patient, provider  I discussed the limitations of evaluation and management by telemedicine and the availability of in person appointments. The patient expressed understanding and agreed to proceed.   HPI: Here for pain management. Her pain is about the same as usual.    ROS: See pertinent positives and negatives per HPI.  Past Medical History:  Diagnosis Date   Acne rosacea    ADD (attention deficit disorder)    Back pain    Chronic insomnia    Colon polyps    Depression    Hyperlipidemia    Hypothyroidism    sees Dr. Zetta Bills    IBS (irritable bowel syndrome)    Migraines    sees Dr. Santiago Glad    Myofascial pain syndrome    sees Dr. Loraine Leriche Philips    Osteoporosis    sees Dr. Zetta Bills    Post menopausal syndrome    Recurrent cystitis    after intercourse, controlled with macrobid    Routine gynecological examination    sees [facility]     Past Surgical History:  Procedure Laterality Date   ABDOMINAL HYSTERECTOMY     parital for prolapse   APPENDECTOMY     COLONOSCOPY  2017   per Dr. Madilyn Fireman, adenomatous polyps, repeat in 5 yrs    OVARIAN CYST REMOVAL     excised on left    TONSILLECTOMY     UPPER GASTROINTESTINAL ENDOSCOPY      Family History  Problem Relation Age of Onset   Other Sister        low serotonin levels   Colon polyps Sister    Other Brother        low serotonin levels    Colon cancer Paternal Uncle    Colon cancer Other    Cancer Other 70       puncle decreased from colon cancer    Rectal cancer Neg Hx    Stomach  cancer Neg Hx      Current Outpatient Medications:    Ascorbic Acid (VITAMIN C) 1000 MG tablet, Take 1,000 mg by mouth 2 (two) times daily. Pt taking 5,000 mg, Disp: , Rfl:    aspirin 81 MG EC tablet, Take 81 mg by mouth daily., Disp: , Rfl:    Calcium 1500 MG tablet, Take 1,500 mg by mouth daily. With Vitamin D, Disp: , Rfl:    denosumab (PROLIA) 60 MG/ML SOLN injection, Inject 60 mg into the skin every 6 (six) months. Administer in upper arm, thigh, or abdomen, Disp: , Rfl:    diphenhydrAMINE (BENADRYL) 25 mg capsule, Take 25 mg by mouth every 6 (six) hours as needed., Disp: , Rfl:    halobetasol (ULTRAVATE) 0.05 % cream, Apply topically 2 (two) times daily., Disp: 50 g, Rfl: 5   HYDROcodone-acetaminophen (NORCO) 10-325 MG tablet, Take 1 tablet by mouth every 6 (six) hours as needed for moderate pain., Disp: 120 tablet, Rfl: 0   levothyroxine (SYNTHROID) 75 MCG tablet, Take 75 mcg by mouth daily. Brand name only, Disp: , Rfl:  LORazepam (ATIVAN) 2 MG tablet, Take 1 tablet (2 mg total) by mouth 3 (three) times daily. (Patient taking differently: Take 2 mg by mouth 3 (three) times daily. PRN), Disp: 90 tablet, Rfl: 5   Magnesium 100 MG TABS, Take 1 tablet by mouth daily at 6 (six) AM. Taking 400 mg daily, Disp: , Rfl:    meclizine (ANTIVERT) 25 MG tablet, Take 1 tablet (25 mg total) by mouth every 4 (four) hours as needed for dizziness., Disp: 30 tablet, Rfl: 5   Melatonin 3 MG CAPS, Take 9 mg by mouth at bedtime., Disp: , Rfl:    methylphenidate (RITALIN) 10 MG tablet, Take 1 tablet (10 mg total) by mouth in the morning and at bedtime., Disp: 60 tablet, Rfl: 0   metroNIDAZOLE (METROCREAM) 0.75 % cream, Apply 1 Application topically daily., Disp: 180 g, Rfl: 3   Multiple Vitamin (MULTIVITAMIN) tablet, Take 1 tablet by mouth daily. Centrum 50 +, Disp: , Rfl:    NONFORMULARY OR COMPOUNDED ITEM, APPLY A DIME SIZED AMOUNT TO AFFECTED AREA 4 TIMES DAILY AS NEEDED., Disp: 1 each, Rfl: 6   Oxycodone  HCl 20 MG TABS, Take 1 tablet (20 mg total) by mouth every 4 (four) hours as needed (pain)., Disp: 180 tablet, Rfl: 0   perphenazine (TRILAFON) 4 MG tablet, TAKE 1 TABLET BY MOUTH EVERY 4 HOURS AS NEEDED FOR MIGRAINES, Disp: 30 tablet, Rfl: 0   Potassium Gluconate 550 MG TABS, Take 1 tablet by mouth daily., Disp: , Rfl:    Probiotic Product (ALIGN PO), Take 1 capsule by mouth daily. , Disp: , Rfl:    rizatriptan (MAXALT) 10 MG tablet, TAKE 1 TABLET BY MOUTH AS NEEDED FOR MIGRAINE, Disp: 10 tablet, Rfl: 3   topiramate (TOPAMAX) 50 MG tablet, Take 3 tablets (150 mg total) by mouth daily., Disp: 270 tablet, Rfl: 3   zaleplon (SONATA) 10 MG capsule, Take 1 capsule (10 mg total) by mouth at bedtime as needed. for sleep, Disp: 90 capsule, Rfl: 1  EXAM:  VITALS per patient if applicable:  GENERAL: alert, oriented, appears well and in no acute distress  HEENT: atraumatic, conjunttiva clear, no obvious abnormalities on inspection of external nose and ears  NECK: normal movements of the head and neck  LUNGS: on inspection no signs of respiratory distress, breathing rate appears normal, no obvious gross SOB, gasping or wheezing  CV: no obvious cyanosis  MS: moves all visible extremities without noticeable abnormality  PSYCH/NEURO: pleasant and cooperative, no obvious depression or anxiety, speech and thought processing grossly intact  ASSESSMENT AND PLAN: Pain management. Indication for chronic opioid: myofascial pain Medication and dose: Norco 10-325 and Oxycodone 20 mg # pills per month: 120 and 180 Last UDS date: 08-18-21 Opioid Treatment Agreement signed (Y/N): 04-06-17 Opioid Treatment Agreement last reviewed with patient:  09-01-22 NCCSRS reviewed this encounter (include red flags): Yes Meds were refilled. She will be in for a well exam in a week or so, and we will get a UDS at that time. Gershon Crane, MD  Discussed the following assessment and plan:  No diagnosis found.     I  discussed the assessment and treatment plan with the patient. The patient was provided an opportunity to ask questions and all were answered. The patient agreed with the plan and demonstrated an understanding of the instructions.   The patient was advised to call back or seek an in-person evaluation if the symptoms worsen or if the condition fails to improve as anticipated.  Review of Systems     Objective:   Physical Exam        Assessment & Plan:

## 2022-09-22 ENCOUNTER — Other Ambulatory Visit: Payer: Self-pay

## 2022-09-22 ENCOUNTER — Encounter: Payer: Self-pay | Admitting: Family Medicine

## 2022-09-22 ENCOUNTER — Ambulatory Visit (INDEPENDENT_AMBULATORY_CARE_PROVIDER_SITE_OTHER): Payer: Medicare Other | Admitting: Family Medicine

## 2022-09-22 VITALS — BP 110/74 | HR 111 | Temp 98.3°F | Wt 131.0 lb

## 2022-09-22 DIAGNOSIS — F909 Attention-deficit hyperactivity disorder, unspecified type: Secondary | ICD-10-CM

## 2022-09-22 DIAGNOSIS — E782 Mixed hyperlipidemia: Secondary | ICD-10-CM

## 2022-09-22 DIAGNOSIS — F32A Depression, unspecified: Secondary | ICD-10-CM

## 2022-09-22 DIAGNOSIS — M810 Age-related osteoporosis without current pathological fracture: Secondary | ICD-10-CM | POA: Diagnosis not present

## 2022-09-22 DIAGNOSIS — F411 Generalized anxiety disorder: Secondary | ICD-10-CM

## 2022-09-22 DIAGNOSIS — K580 Irritable bowel syndrome with diarrhea: Secondary | ICD-10-CM | POA: Diagnosis not present

## 2022-09-22 DIAGNOSIS — G43909 Migraine, unspecified, not intractable, without status migrainosus: Secondary | ICD-10-CM | POA: Diagnosis not present

## 2022-09-22 DIAGNOSIS — G4700 Insomnia, unspecified: Secondary | ICD-10-CM | POA: Diagnosis not present

## 2022-09-22 DIAGNOSIS — E039 Hypothyroidism, unspecified: Secondary | ICD-10-CM | POA: Diagnosis not present

## 2022-09-22 DIAGNOSIS — R739 Hyperglycemia, unspecified: Secondary | ICD-10-CM

## 2022-09-22 DIAGNOSIS — M7918 Myalgia, other site: Secondary | ICD-10-CM | POA: Diagnosis not present

## 2022-09-22 DIAGNOSIS — F1190 Opioid use, unspecified, uncomplicated: Secondary | ICD-10-CM

## 2022-09-22 DIAGNOSIS — Z23 Encounter for immunization: Secondary | ICD-10-CM

## 2022-09-22 DIAGNOSIS — M129 Arthropathy, unspecified: Secondary | ICD-10-CM

## 2022-09-22 LAB — CBC WITH DIFFERENTIAL/PLATELET
Basophils Absolute: 0 10*3/uL (ref 0.0–0.1)
Basophils Relative: 0.3 % (ref 0.0–3.0)
Eosinophils Absolute: 0.1 10*3/uL (ref 0.0–0.7)
Eosinophils Relative: 1.2 % (ref 0.0–5.0)
HCT: 45.2 % (ref 36.0–46.0)
Hemoglobin: 14.3 g/dL (ref 12.0–15.0)
Lymphocytes Relative: 23.5 % (ref 12.0–46.0)
Lymphs Abs: 1.6 10*3/uL (ref 0.7–4.0)
MCHC: 31.7 g/dL (ref 30.0–36.0)
MCV: 101 fl — ABNORMAL HIGH (ref 78.0–100.0)
Monocytes Absolute: 0.3 10*3/uL (ref 0.1–1.0)
Monocytes Relative: 4.7 % (ref 3.0–12.0)
Neutro Abs: 4.7 10*3/uL (ref 1.4–7.7)
Neutrophils Relative %: 70.3 % (ref 43.0–77.0)
Platelets: 328 10*3/uL (ref 150.0–400.0)
RBC: 4.47 Mil/uL (ref 3.87–5.11)
RDW: 13 % (ref 11.5–15.5)
WBC: 6.7 10*3/uL (ref 4.0–10.5)

## 2022-09-22 LAB — HEMOGLOBIN A1C: Hgb A1c MFr Bld: 4.6 % (ref 4.6–6.5)

## 2022-09-22 LAB — HEPATIC FUNCTION PANEL
ALT: 14 U/L (ref 0–35)
AST: 22 U/L (ref 0–37)
Albumin: 3.8 g/dL (ref 3.5–5.2)
Alkaline Phosphatase: 76 U/L (ref 39–117)
Bilirubin, Direct: 0.1 mg/dL (ref 0.0–0.3)
Total Bilirubin: 0.4 mg/dL (ref 0.2–1.2)
Total Protein: 6.9 g/dL (ref 6.0–8.3)

## 2022-09-22 LAB — BASIC METABOLIC PANEL
BUN: 16 mg/dL (ref 6–23)
CO2: 26 meq/L (ref 19–32)
Calcium: 9.8 mg/dL (ref 8.4–10.5)
Chloride: 102 meq/L (ref 96–112)
Creatinine, Ser: 0.81 mg/dL (ref 0.40–1.20)
GFR: 69.22 mL/min (ref >60.00)
Glucose, Bld: 115 mg/dL — ABNORMAL HIGH (ref 70–99)
Potassium: 3.9 meq/L (ref 3.5–5.1)
Sodium: 137 meq/L (ref 135–145)

## 2022-09-22 LAB — LIPID PANEL
Cholesterol: 242 mg/dL — ABNORMAL HIGH (ref 0–200)
HDL: 51 mg/dL (ref >39.00)
LDL Cholesterol: 143 mg/dL — ABNORMAL HIGH (ref 0–99)
NonHDL: 191.3
Total CHOL/HDL Ratio: 5
Triglycerides: 244 mg/dL — ABNORMAL HIGH (ref 0.0–149.0)
VLDL: 48.8 mg/dL — ABNORMAL HIGH (ref 0.0–40.0)

## 2022-09-22 LAB — TSH: TSH: 0.92 u[IU]/mL (ref 0.35–5.50)

## 2022-09-22 MED ORDER — RIZATRIPTAN BENZOATE 10 MG PO TABS: 10.0000 mg | ORAL_TABLET | ORAL | 11 refills | Status: DC | PRN

## 2022-09-22 MED ORDER — DIPHENOXYLATE-ATROPINE 2.5-0.025 MG PO TABS: 1.0000 | ORAL_TABLET | Freq: Two times a day (BID) | ORAL | Status: DC | PRN

## 2022-09-22 MED ORDER — HALOBETASOL PROPIONATE 0.05 % EX CREA: TOPICAL_CREAM | Freq: Two times a day (BID) | CUTANEOUS | 11 refills | Status: DC

## 2022-09-22 MED ORDER — LORAZEPAM 2 MG PO TABS: 2.0000 mg | ORAL_TABLET | Freq: Three times a day (TID) | ORAL | 5 refills | Status: DC

## 2022-09-22 NOTE — Addendum Note (Signed)
Addended by: Carola Rhine on: 09/22/2022 05:20 PM   Modules accepted: Orders

## 2022-09-22 NOTE — Progress Notes (Signed)
Subjective:    Patient ID: Linda Kent, female    DOB: Sep 04, 1945, 77 y.o.   MRN: [id number]  HPI Here to follow up on issues. Her main complaint for the past 3 months is diarrhea. She continues to pass frequent loose stools. She has not pain with these, no signs of bleeding. She has been taking Lomotil, but she only uses this sporadically and it often causes her to be constipated for 24 hours. Otherwise her headaches are stable. Her anxiety and depression are stable. Her ADHD is well controlled. She is in a pain management program with [place] for her myofascial pain syndrome.   Review of Systems  Constitutional: Negative.   HENT: Negative.    Eyes: Negative.   Respiratory: Negative.    Cardiovascular: Negative.   Gastrointestinal:  Positive for diarrhea.  Genitourinary:  Negative for decreased urine volume, difficulty urinating, dyspareunia, dysuria, enuresis, flank pain, frequency, hematuria, pelvic pain and urgency.  Musculoskeletal:  Positive for myalgias.  Skin: Negative.   Neurological:  Positive for headaches.  Psychiatric/Behavioral: Negative.         Objective:   Physical Exam Constitutional:      General: She is not in acute distress.    Appearance: She is well-developed.     Comments: Frail   HENT:     Head: Normocephalic and atraumatic.     Right Ear: External ear normal.     Left Ear: External ear normal.     Nose: Nose normal.     Mouth/Throat:     Pharynx: No oropharyngeal exudate.  Eyes:     General: No scleral icterus.    Conjunctiva/sclera: Conjunctivae normal.     Pupils: Pupils are equal, round, and reactive to light.  Neck:     Thyroid: No thyromegaly.     Vascular: No JVD.  Cardiovascular:     Rate and Rhythm: Normal rate and regular rhythm.     Pulses: Normal pulses.     Heart sounds: Normal heart sounds. No murmur heard.    No friction rub. No gallop.  Pulmonary:     Effort: Pulmonary effort is normal. No respiratory distress.     Breath  sounds: Normal breath sounds. No wheezing or rales.  Chest:     Chest wall: No tenderness.  Abdominal:     General: Bowel sounds are normal. There is no distension.     Palpations: Abdomen is soft. There is no mass.     Tenderness: There is no abdominal tenderness. There is no guarding or rebound.  Musculoskeletal:        General: No tenderness. Normal range of motion.     Cervical back: Normal range of motion and neck supple.  Lymphadenopathy:     Cervical: No cervical adenopathy.  Skin:    General: Skin is warm and dry.     Findings: No erythema or rash.  Neurological:     General: No focal deficit present.     Mental Status: She is alert and oriented to person, place, and time.     Cranial Nerves: No cranial nerve deficit.     Motor: No abnormal muscle tone.     Coordination: Coordination normal.     Deep Tendon Reflexes: Reflexes are normal and symmetric. Reflexes normal.  Psychiatric:        Mood and Affect: Mood normal.        Behavior: Behavior normal.        Thought Content: Thought content normal.  Judgment: Judgment normal.           Assessment & Plan:  Her ADHD and headaches and depression with anxiety are stable. Her myofascial pain syndrome is stable. She is having diarrhea predominant IBS, and I explained a different way to take Lomotil. She will take one one pill at a time, and she will take this twice a day every day. Report back in weeks. Get fasting labs for lipids, etc. We spent a total of ( 35  ) minutes reviewing records and discussing these issues.  Gershon Crane, MD

## 2022-09-24 LAB — DRUG MONITOR, PANEL 1, W/CONF, URINE
Amphetamines: NEGATIVE ng/mL (ref <500)
Barbiturates: NEGATIVE ng/mL (ref <300)
Benzodiazepines: NEGATIVE ng/mL (ref <100)
Cocaine Metabolite: NEGATIVE ng/mL (ref <150)
Codeine: NEGATIVE ng/mL (ref <50)
Creatinine: 66.2 mg/dL (ref >=20.0)
Hydrocodone: 343 ng/mL — ABNORMAL HIGH (ref <50)
Hydromorphone: 215 ng/mL — ABNORMAL HIGH (ref <50)
Marijuana Metabolite: NEGATIVE ng/mL (ref <20)
Methadone Metabolite: NEGATIVE ng/mL (ref <100)
Morphine: NEGATIVE ng/mL (ref <50)
Norhydrocodone: 1859 ng/mL — ABNORMAL HIGH (ref <50)
Noroxycodone: 7627 ng/mL — ABNORMAL HIGH (ref <50)
Opiates: POSITIVE ng/mL — AB (ref <100)
Oxidant: NEGATIVE ug/mL (ref <200)
Oxycodone: 551 ng/mL — ABNORMAL HIGH (ref <50)
Oxycodone: POSITIVE ng/mL — AB (ref <100)
Oxymorphone: 2701 ng/mL — ABNORMAL HIGH (ref <50)
Phencyclidine: NEGATIVE ng/mL (ref <25)
pH: 7 (ref 4.5–9.0)

## 2022-09-24 LAB — DM TEMPLATE

## 2022-12-03 ENCOUNTER — Encounter: Payer: Self-pay | Admitting: Family Medicine

## 2022-12-03 ENCOUNTER — Telehealth (INDEPENDENT_AMBULATORY_CARE_PROVIDER_SITE_OTHER): Payer: Medicare Other | Admitting: Family Medicine

## 2022-12-03 DIAGNOSIS — F1190 Opioid use, unspecified, uncomplicated: Secondary | ICD-10-CM

## 2022-12-03 DIAGNOSIS — M7918 Myalgia, other site: Secondary | ICD-10-CM

## 2022-12-03 MED ORDER — OXYCODONE HCL 20 MG PO TABS: 20.0000 mg | ORAL_TABLET | ORAL | 0 refills | Status: DC | PRN

## 2022-12-03 MED ORDER — METHYLPHENIDATE HCL 10 MG PO TABS: 10.0000 mg | ORAL_TABLET | Freq: Two times a day (BID) | ORAL | 0 refills | Status: DC

## 2022-12-03 MED ORDER — HYDROCODONE-ACETAMINOPHEN 10-325 MG PO TABS: 1.0000 | ORAL_TABLET | Freq: Four times a day (QID) | ORAL | 0 refills | Status: DC | PRN

## 2022-12-03 NOTE — Progress Notes (Signed)
Subjective:    Patient ID: Linda Kent, female    DOB: January 16, 1946, 77 y.o.   MRN: [id number]  HPI Virtual Visit via Video Note  I connected with the patient on 12/03/22 at  1:45 PM EST by a video enabled telemedicine application and verified that I am speaking with the correct person using two identifiers.  Location patient: home Location provider:work or home office Persons participating in the virtual visit: patient, provider  I discussed the limitations of evaluation and management by telemedicine and the availability of in person appointments. The patient expressed understanding and agreed to proceed.   HPI: Here for pain management. She is doing well.    ROS: See pertinent positives and negatives per HPI.  Past Medical History:  Diagnosis Date   Acne rosacea    ADD (attention deficit disorder)    Back pain    Chronic insomnia    Colon polyps    Depression    Hyperlipidemia    Hypothyroidism    sees Dr. Zetta Bills    IBS (irritable bowel syndrome)    Migraines    sees Dr. Santiago Glad    Myofascial pain syndrome    sees Dr. Loraine Leriche Philips    Osteoporosis    sees Dr. Zetta Bills    Post menopausal syndrome    Recurrent cystitis    after intercourse, controlled with macrobid    Routine gynecological examination    sees [facility]     Past Surgical History:  Procedure Laterality Date   ABDOMINAL HYSTERECTOMY     parital for prolapse   APPENDECTOMY     COLONOSCOPY  04/07/2021   per Dr. Tomasa Rand, adenomatous polyp, no repeats needed   OVARIAN CYST REMOVAL     excised on left    TONSILLECTOMY     UPPER GASTROINTESTINAL ENDOSCOPY      Family History  Problem Relation Age of Onset   Other Sister        low serotonin levels   Colon polyps Sister    Other Brother        low serotonin levels    Colon cancer Paternal Uncle    Colon cancer Other    Cancer Other 70       puncle decreased from colon cancer    Rectal cancer Neg Hx    Stomach cancer  Neg Hx      Current Outpatient Medications:    Ascorbic Acid (VITAMIN C) 1000 MG tablet, Take 1,000 mg by mouth 2 (two) times daily. Pt taking 5,000 mg, Disp: , Rfl:    aspirin 81 MG EC tablet, Take 81 mg by mouth daily., Disp: , Rfl:    Calcium 1500 MG tablet, Take 1,500 mg by mouth daily. With Vitamin D, Disp: , Rfl:    denosumab (PROLIA) 60 MG/ML SOLN injection, Inject 60 mg into the skin every 6 (six) months. Administer in upper arm, thigh, or abdomen, Disp: , Rfl:    diphenhydrAMINE (BENADRYL) 25 mg capsule, Take 25 mg by mouth every 6 (six) hours as needed., Disp: , Rfl:    diphenoxylate-atropine (LOMOTIL) 2.5-0.025 MG tablet, Take 1 tablet by mouth 2 (two) times daily as needed for diarrhea or loose stools., Disp: , Rfl:    halobetasol (ULTRAVATE) 0.05 % cream, Apply topically 2 (two) times daily., Disp: 50 g, Rfl: 11   HYDROcodone-acetaminophen (NORCO) 10-325 MG tablet, Take 1 tablet by mouth every 6 (six) hours as needed for moderate pain., Disp: 120 tablet, Rfl:  0   HYDROcodone-acetaminophen (NORCO) 10-325 MG tablet, Take 1 tablet by mouth every 6 (six) hours as needed for moderate pain., Disp: 120 tablet, Rfl: 0   HYDROcodone-acetaminophen (NORCO) 10-325 MG tablet, Take 1 tablet by mouth every 6 (six) hours as needed for moderate pain., Disp: 120 tablet, Rfl: 0   levothyroxine (SYNTHROID) 75 MCG tablet, Take 75 mcg by mouth daily. Brand name only, Disp: , Rfl:    LORazepam (ATIVAN) 2 MG tablet, Take 1 tablet (2 mg total) by mouth 3 (three) times daily., Disp: 90 tablet, Rfl: 5   Magnesium 100 MG TABS, Take 1 tablet by mouth daily at 6 (six) AM. Taking 400 mg daily, Disp: , Rfl:    meclizine (ANTIVERT) 25 MG tablet, Take 1 tablet (25 mg total) by mouth every 4 (four) hours as needed for dizziness., Disp: 30 tablet, Rfl: 5   Melatonin 3 MG CAPS, Take 9 mg by mouth at bedtime., Disp: , Rfl:    methylphenidate (RITALIN) 10 MG tablet, Take 1 tablet (10 mg total) by mouth in the morning and  at bedtime., Disp: 60 tablet, Rfl: 0   methylphenidate (RITALIN) 10 MG tablet, Take 1 tablet (10 mg total) by mouth in the morning and at bedtime., Disp: 60 tablet, Rfl: 0   methylphenidate (RITALIN) 10 MG tablet, Take 1 tablet (10 mg total) by mouth in the morning and at bedtime., Disp: 60 tablet, Rfl: 0   metroNIDAZOLE (METROCREAM) 0.75 % cream, Apply 1 Application topically daily., Disp: 180 g, Rfl: 3   Multiple Vitamin (MULTIVITAMIN) tablet, Take 1 tablet by mouth daily. Centrum 50 +, Disp: , Rfl:    NONFORMULARY OR COMPOUNDED ITEM, APPLY A DIME SIZED AMOUNT TO AFFECTED AREA 4 TIMES DAILY AS NEEDED., Disp: 1 each, Rfl: 6   Oxycodone HCl 20 MG TABS, Take 1 tablet (20 mg total) by mouth every 4 (four) hours as needed (pain)., Disp: 180 tablet, Rfl: 0   Oxycodone HCl 20 MG TABS, Take 1 tablet (20 mg total) by mouth every 4 (four) hours as needed (pain)., Disp: 180 tablet, Rfl: 0   Oxycodone HCl 20 MG TABS, Take 1 tablet (20 mg total) by mouth every 4 (four) hours as needed (pain)., Disp: 180 tablet, Rfl: 0   perphenazine (TRILAFON) 4 MG tablet, TAKE 1 TABLET BY MOUTH EVERY 4 HOURS AS NEEDED FOR MIGRAINES, Disp: 30 tablet, Rfl: 0   Potassium Gluconate 550 MG TABS, Take 1 tablet by mouth daily., Disp: , Rfl:    Probiotic Product (ALIGN PO), Take 1 capsule by mouth daily. , Disp: , Rfl:    rizatriptan (MAXALT) 10 MG tablet, Take 1 tablet (10 mg total) by mouth as needed for migraine. May repeat in 2 hours if needed, Disp: 20 tablet, Rfl: 11   topiramate (TOPAMAX) 50 MG tablet, Take 3 tablets (150 mg total) by mouth daily., Disp: 270 tablet, Rfl: 3   zaleplon (SONATA) 10 MG capsule, Take 1 capsule (10 mg total) by mouth at bedtime as needed. for sleep, Disp: 90 capsule, Rfl: 1  EXAM:  VITALS per patient if applicable:  GENERAL: alert, oriented, appears well and in no acute distress  HEENT: atraumatic, conjunttiva clear, no obvious abnormalities on inspection of external nose and ears  NECK:  normal movements of the head and neck  LUNGS: on inspection no signs of respiratory distress, breathing rate appears normal, no obvious gross SOB, gasping or wheezing  CV: no obvious cyanosis  MS: moves all visible extremities without noticeable abnormality  PSYCH/NEURO: pleasant and cooperative, no obvious depression or anxiety, speech and thought processing grossly intact  ASSESSMENT AND PLAN: Pain management. Indication for chronic opioid: myofascial pain  Medication and dose: Norco 10-325 and Oxycodone 20 mg # pills per month: 120 and 180 Last UDS date: 09-22-22 Opioid Treatment Agreement signed (Y/N): 04-06-17 Opioid Treatment Agreement last reviewed with patient:  12-03-22 [facility] reviewed this encounter (include red flags): Yes Meds were refilled. Gershon Crane, MD  Discussed the following assessment and plan:  No diagnosis found.     I discussed the assessment and treatment plan with the patient. The patient was provided an opportunity to ask questions and all were answered. The patient agreed with the plan and demonstrated an understanding of the instructions.   The patient was advised to call back or seek an in-person evaluation if the symptoms worsen or if the condition fails to improve as anticipated.      Review of Systems     Objective:   Physical Exam        Assessment & Plan:

## 2022-12-08 ENCOUNTER — Telehealth: Payer: Medicare Other | Admitting: Family Medicine

## 2022-12-31 ENCOUNTER — Ambulatory Visit: Payer: Medicare Other | Admitting: Family Medicine

## 2023-01-05 ENCOUNTER — Ambulatory Visit: Payer: Self-pay | Admitting: Family Medicine

## 2023-01-05 NOTE — Telephone Encounter (Signed)
   Chief Complaint: Pain Symptoms: Urinary pain Frequency: Constant Pertinent Negatives: Patient denies relief Disposition: [] ED /[x] Urgent Care (no appt availability in office) / [x] Appointment(In office/virtual)/ []  [facility]/ [] Home Care/ [] Refused Recommended Disposition /[] [facility]/ []  Follow-up with PCP  Additional Notes: Patient called in experiencing extreme pain. Patient stated that she has a history of chronic pain, but this pain is similar to UTIs she has had in the past. Patient seemed very upset on the phone and requested a virtual appointment because she did not want to come into the office. I called the CAL to see if accommodations could be made, but no one answered. I advised the patient that she would most likely need to submit a urine sample and offered her an afternoon appointment in the office today, she refused. I offered her virtual urgent care, but did advise that she may still need to go in and give a urine sample. Patient refused this disposition as well. Patient has an appointment in the office on Friday and stated that she wants to wait until then to be seen.   Reason for Disposition  Patient sounds very sick or weak to the triager  Answer Assessment - Initial Assessment Questions 1. SYMPTOM: "What's the main symptom you're concerned about?" (e.g., frequency, incontinence)     UTI symptoms- patient states that pain feels similar to UTIs that she has had in the past 3. PAIN: "Is there any pain?" If Yes, ask: "How bad is it?" (Scale: 1-10; mild, moderate, severe)     Yes- patient stated that she suffers from chronic pain, but this pain is specific to a UTI and has brought her to tears 4. CAUSE: "What do you think is causing the symptoms?"     UTI  Protocols used: Urinary Symptoms-A-AH

## 2023-01-06 NOTE — Telephone Encounter (Signed)
FYI Pt has OV tomorrow with Dr Clent Ridges at 10.30 am

## 2023-01-07 ENCOUNTER — Ambulatory Visit (INDEPENDENT_AMBULATORY_CARE_PROVIDER_SITE_OTHER): Payer: Medicare Other | Admitting: Family Medicine

## 2023-01-07 ENCOUNTER — Encounter: Payer: Self-pay | Admitting: Family Medicine

## 2023-01-07 VITALS — BP 98/64 | HR 87 | Wt 133.6 lb

## 2023-01-07 DIAGNOSIS — N390 Urinary tract infection, site not specified: Secondary | ICD-10-CM | POA: Diagnosis not present

## 2023-01-07 LAB — POC URINALSYSI DIPSTICK (AUTOMATED)
Bilirubin, UA: NEGATIVE
Blood, UA: NEGATIVE
Glucose, UA: NEGATIVE
Ketones, UA: NEGATIVE
Leukocytes, UA: NEGATIVE
Nitrite, UA: NEGATIVE
Protein, UA: NEGATIVE
Spec Grav, UA: 1.01 (ref 1.010–1.025)
Urobilinogen, UA: 0.2 U/dL
pH, UA: 6 (ref 5.0–8.0)

## 2023-01-07 MED ORDER — RIZATRIPTAN BENZOATE 10 MG PO TABS: 10.0000 mg | ORAL_TABLET | ORAL | 11 refills | Status: DC | PRN

## 2023-01-07 MED ORDER — CIPROFLOXACIN HCL 500 MG PO TABS
500.0000 mg | ORAL_TABLET | Freq: Two times a day (BID) | ORAL | 0 refills | Status: AC
Start: 2023-01-07 — End: 2023-01-17

## 2023-01-07 MED ORDER — FLUCONAZOLE 150 MG PO TABS: 150.0000 mg | ORAL_TABLET | Freq: Once | ORAL | 11 refills | Status: AC

## 2023-01-07 NOTE — Progress Notes (Signed)
   Subjective:    Patient ID: Linda Kent, female    DOB: 03-20-1945, 77 y.o.   MRN: [id number]  HPI Here for one week of urinary urgency and burning. No fever. Drinking lots of water.    Review of Systems  Constitutional: Negative.   Respiratory: Negative.    Cardiovascular: Negative.   Genitourinary:  Positive for dysuria and urgency. Negative for flank pain and hematuria.       Objective:   Physical Exam Constitutional:      Comments: Somewhat weak, in a wheelchair   Cardiovascular:     Rate and Rhythm: Normal rate and regular rhythm.     Pulses: Normal pulses.     Heart sounds: Normal heart sounds.  Pulmonary:     Effort: Pulmonary effort is normal.     Breath sounds: Normal breath sounds.  Abdominal:     General: Abdomen is flat. Bowel sounds are normal. There is no distension.     Palpations: Abdomen is soft. There is no mass.     Tenderness: There is no abdominal tenderness. There is no right CVA tenderness, left CVA tenderness, guarding or rebound.     Hernia: No hernia is present.  Neurological:     Mental Status: She is alert.           Assessment & Plan:  UTI, treat with 10 days of Cipro. Culture the sample.  Gershon Crane, MD

## 2023-01-07 NOTE — Addendum Note (Signed)
Addended by: Carola Rhine on: 01/07/2023 11:44 AM   Modules accepted: Orders

## 2023-01-08 LAB — URINE CULTURE
MICRO NUMBER:: 15877097
SPECIMEN QUALITY:: ADEQUATE

## 2023-01-11 ENCOUNTER — Telehealth: Payer: Self-pay | Admitting: Family Medicine

## 2023-01-11 NOTE — Telephone Encounter (Unsigned)
Copied from CRM [id number]. Topic: General - Call Back - No Documentation >> Jan 11, 2023 11:40 AM Leavy Cella D wrote: Reason for CRM: Patient stated that she received a miss call . Did not see anything in patients encounters . Please call patient back when available .

## 2023-01-13 ENCOUNTER — Telehealth: Payer: Self-pay

## 2023-01-13 NOTE — Telephone Encounter (Unsigned)
Copied from CRM [id number]. Topic: General - Call Back - No Documentation >> Jan 13, 2023  1:27 PM Elizebeth Brooking wrote: Reason for CRM: Patient is returning call back, requesting a call back says she will be by her phone all day this afternoon

## 2023-01-13 NOTE — Telephone Encounter (Signed)
Attempted to call pt back no option to speak with pt will try again later

## 2023-01-14 NOTE — Telephone Encounter (Signed)
Left a message with pt husband advised to ask pt to call me back at the office

## 2023-01-17 NOTE — Telephone Encounter (Signed)
This encounter has already been resolved

## 2023-01-17 NOTE — Telephone Encounter (Signed)
Spoke with pt verbalized understanding of her Urine culture results and Dr Clent Ridges advise

## 2023-01-27 ENCOUNTER — Telehealth: Payer: Self-pay | Admitting: Family Medicine

## 2023-01-27 DIAGNOSIS — M810 Age-related osteoporosis without current pathological fracture: Secondary | ICD-10-CM | POA: Diagnosis not present

## 2023-01-27 DIAGNOSIS — E559 Vitamin D deficiency, unspecified: Secondary | ICD-10-CM | POA: Diagnosis not present

## 2023-01-27 DIAGNOSIS — E039 Hypothyroidism, unspecified: Secondary | ICD-10-CM | POA: Diagnosis not present

## 2023-01-27 DIAGNOSIS — Z23 Encounter for immunization: Secondary | ICD-10-CM | POA: Diagnosis not present

## 2023-01-27 NOTE — Telephone Encounter (Signed)
 Copied from CRM [id number]. Topic: General - Call Back - No Documentation >> Jan 26, 2023  3:42 PM [name] V wrote: Reason for CRM: Patient calling in stating she received a message from Nancy about a mixup on Thursday but there is no note agent could find in chart. Please call patient back with details about mixup. If voicemail is left, please include all details so that they can try to avoid any more confusion.

## 2023-01-28 NOTE — Telephone Encounter (Signed)
I already spoke with pt regarding this.

## 2023-01-29 ENCOUNTER — Other Ambulatory Visit: Payer: Self-pay | Admitting: Family Medicine

## 2023-02-01 ENCOUNTER — Other Ambulatory Visit: Payer: Self-pay | Admitting: Family Medicine

## 2023-02-16 ENCOUNTER — Ambulatory Visit: Payer: Self-pay | Admitting: Family Medicine

## 2023-02-16 NOTE — Telephone Encounter (Signed)
Copied from CRM [id number]. Topic: Clinical - Red Word Triage >> Feb 16, 2023  1:46 PM Florestine Avers wrote: Red Word that prompted transfer to Nurse Triage: Patient called in stating that she has had diarrhea for about two weeks, maybe a little bit longer. She states she lives off crackers and potatoes. The patient states that Dr. Clent Ridges called in a prescription of diphenoxylate-atropine (LOMOTIL) 2.5-0.025 MG tablet, and she started using it and it was giving her a little bit of relief. She said one day it'll help her, then the next morning she would have diarrhea again. Patient states she is starting to become extremely weak, and wants to know if she should double up on the medication, or try something else.   Chief Complaint: Diarrhea Symptoms: Diarrhea, abdominal cramps, weakness  Frequency: Intermittent  Pertinent Negatives: Patient denies vomiting, fever, blood in stool Disposition: [] ED /[] Urgent Care (no appt availability in office) / [] Appointment(In office/virtual)/ []  [facility]/ [] Home Care/ [] Refused Recommended Disposition /[]  Mobile Bus/ []  Follow-up with PCP Additional Notes: Patient reports that she has been experiencing diarrhea for the last 2 weeks and was prescribed Lomotil. She states that it initially helped her symptoms but that she is now having diarrhea every day again. She is asking if Dr. Clent Ridges would like to adjust her dose or prescribe something stronger. Patient states she is unable to come for an appointment due to her fatigue and diarrhea. She states that if she does not hear back from the office she will begin taking 2 tablets of Lomotil 4 times a day to control her symptoms. I advised her to not change how she is taking the medication until she hears back from the office. She verbalized understanding and agreement of this.      Reason for Disposition  [1] Mild diarrhea (e.g., 1-3 or more stools than normal in past 24 hours) without known cause AND [2]  present >  7 days  Answer Assessment - Initial Assessment Questions 1. DIARRHEA SEVERITY: "How bad is the diarrhea?" "How many more stools have you had in the past 24 hours than normal?"    - NO DIARRHEA (SCALE 0)   - MILD (SCALE 1-3): Few loose or mushy BMs; increase of 1-3 stools over normal daily number of stools; mild increase in ostomy output.   -  MODERATE (SCALE 4-7): Increase of 4-6 stools daily over normal; moderate increase in ostomy output.   -  SEVERE (SCALE 8-10; OR "WORST POSSIBLE"): Increase of 7 or more stools daily over normal; moderate increase in ostomy output; incontinence.     At least 1 every day for the last 5 days 2. ONSET: "When did the diarrhea begin?"      2 weeks 3. BM CONSISTENCY: "How loose or watery is the diarrhea?"      Very watery, especially in the mornings, today loose 4. VOMITING: "Are you also vomiting?" If Yes, ask: "How many times in the past 24 hours?"      No 5. ABDOMEN PAIN: "Are you having any abdomen pain?" If Yes, ask: "What does it feel like?" (e.g., crampy, dull, intermittent, constant)      Crampy 6. ABDOMEN PAIN SEVERITY: If present, ask: "How bad is the pain?"  (e.g., Scale 1-10; mild, moderate, or severe)   - MILD (1-3): doesn't interfere with normal activities, abdomen soft and not tender to touch    - MODERATE (4-7): interferes with normal activities or awakens from sleep, abdomen tender to touch    -  SEVERE (8-10): excruciating pain, doubled over, unable to do any normal activities       Mild  7. ORAL INTAKE: If vomiting, "Have you been able to drink liquids?" "How much liquids have you had in the past 24 hours?"     Yes 8. HYDRATION: "Any signs of dehydration?" (e.g., dry mouth [not just dry lips], too weak to stand, dizziness, new weight loss) "When did you last urinate?"     No 9. EXPOSURE: "Have you traveled to a foreign country recently?" "Have you been exposed to anyone with diarrhea?" "Could you have eaten any food that was  spoiled?"     No 10. ANTIBIOTIC USE: "Are you taking antibiotics now or have you taken antibiotics in the past 2 months?"       No 11. OTHER SYMPTOMS: "Do you have any other symptoms?" (e.g., fever, blood in stool)       Fatigue  12. PREGNANCY: "Is there any chance you are pregnant?" "When was your last menstrual period?"       No  Protocols used: [facility]

## 2023-02-18 NOTE — Telephone Encounter (Signed)
She can take 2 tablets of this at a time BID if needed

## 2023-02-22 ENCOUNTER — Other Ambulatory Visit: Payer: Self-pay

## 2023-02-22 NOTE — Telephone Encounter (Signed)
Spoke with pt advised of Dr Clent Ridges recommendation

## 2023-02-25 ENCOUNTER — Other Ambulatory Visit: Payer: Self-pay | Admitting: Family Medicine

## 2023-03-04 ENCOUNTER — Telehealth: Payer: Self-pay

## 2023-03-04 NOTE — Telephone Encounter (Signed)
Copied from CRM [id number]. Topic: Referral - Request for Referral >> Mar 04, 2023  2:54 PM Almira Coaster wrote: Did the patient discuss referral with their provider in the last year? No, patient's husband was diagnosed with RSV, son is calling for an order/referral for home health to assist mother. (If No - schedule appointment) (If Yes - send message)  Appointment offered? Patient is scheduled for an appointment on 03/07/2023  Type of order/referral and detailed reason for visit: Home health aide  Preference of office, provider, location: Home health  If referral order, have you been seen by this specialty before? No (If Yes, this issue or another issue? When? Where?  Can we respond through MyChart? No, phone call to son Brett Canales Littlejohn [phone]

## 2023-03-07 ENCOUNTER — Telehealth (INDEPENDENT_AMBULATORY_CARE_PROVIDER_SITE_OTHER): Payer: Medicare Other | Admitting: Family Medicine

## 2023-03-07 ENCOUNTER — Encounter: Payer: Self-pay | Admitting: Family Medicine

## 2023-03-07 DIAGNOSIS — M7918 Myalgia, other site: Secondary | ICD-10-CM | POA: Diagnosis not present

## 2023-03-07 DIAGNOSIS — F1190 Opioid use, unspecified, uncomplicated: Secondary | ICD-10-CM

## 2023-03-07 MED ORDER — METHYLPHENIDATE HCL 10 MG PO TABS: 10.0000 mg | ORAL_TABLET | Freq: Two times a day (BID) | ORAL | 0 refills | Status: DC

## 2023-03-07 MED ORDER — HYDROCODONE-ACETAMINOPHEN 10-325 MG PO TABS
1.0000 | ORAL_TABLET | Freq: Four times a day (QID) | ORAL | 0 refills | Status: DC | PRN
Start: 2023-03-07 — End: 2023-06-09

## 2023-03-07 MED ORDER — OXYCODONE HCL 20 MG PO TABS: 20.0000 mg | ORAL_TABLET | ORAL | 0 refills | Status: DC | PRN

## 2023-03-07 MED ORDER — HYDROCODONE-ACETAMINOPHEN 10-325 MG PO TABS: 1.0000 | ORAL_TABLET | Freq: Four times a day (QID) | ORAL | 0 refills | Status: DC | PRN

## 2023-03-07 MED ORDER — METHYLPHENIDATE HCL 10 MG PO TABS
10.0000 mg | ORAL_TABLET | Freq: Two times a day (BID) | ORAL | 0 refills | Status: DC
Start: 2023-03-07 — End: 2023-06-09

## 2023-03-07 NOTE — Progress Notes (Signed)
Subjective:    Patient ID: Linda Kent, female    DOB: October 11, 1945, 78 y.o.   MRN: [id number]  HPI Virtual Visit via Video Note  I connected with the patient on 03/07/23 at  1:00 PM EST by a video enabled telemedicine application and verified that I am speaking with the correct person using two identifiers.  Location patient: home Location provider:work or home office Persons participating in the virtual visit: patient, provider  I discussed the limitations of evaluation and management by telemedicine and the availability of in person appointments. The patient expressed understanding and agreed to proceed.   HPI: Here for pain management. She is doing well.    ROS: See pertinent positives and negatives per HPI.  Past Medical History:  Diagnosis Date   Acne rosacea    ADD (attention deficit disorder)    Back pain    Chronic insomnia    Colon polyps    Depression    Hyperlipidemia    Hypothyroidism    sees Dr. Zetta Bills    IBS (irritable bowel syndrome)    Migraines    sees Dr. Santiago Glad    Myofascial pain syndrome    sees Dr. Loraine Leriche Philips    Osteoporosis    sees Dr. Zetta Bills    Post menopausal syndrome    Recurrent cystitis    after intercourse, controlled with macrobid    Routine gynecological examination    sees [facility]     Past Surgical History:  Procedure Laterality Date   ABDOMINAL HYSTERECTOMY     parital for prolapse   APPENDECTOMY     COLONOSCOPY  04/07/2021   per Dr. Tomasa Rand, adenomatous polyp, no repeats needed   OVARIAN CYST REMOVAL     excised on left    TONSILLECTOMY     UPPER GASTROINTESTINAL ENDOSCOPY      Family History  Problem Relation Age of Onset   Other Sister        low serotonin levels   Colon polyps Sister    Other Brother        low serotonin levels    Colon cancer Paternal Uncle    Colon cancer Other    Cancer Other 70       puncle decreased from colon cancer    Rectal cancer Neg Hx    Stomach cancer  Neg Hx      Current Outpatient Medications:    Ascorbic Acid (VITAMIN C) 1000 MG tablet, Take 1,000 mg by mouth 2 (two) times daily. Pt taking 5,000 mg, Disp: , Rfl:    aspirin 81 MG EC tablet, Take 81 mg by mouth daily., Disp: , Rfl:    Calcium 1500 MG tablet, Take 1,500 mg by mouth daily. With Vitamin D, Disp: , Rfl:    denosumab (PROLIA) 60 MG/ML SOLN injection, Inject 60 mg into the skin every 6 (six) months. Administer in upper arm, thigh, or abdomen, Disp: , Rfl:    diphenhydrAMINE (BENADRYL) 25 mg capsule, Take 25 mg by mouth every 6 (six) hours as needed., Disp: , Rfl:    diphenoxylate-atropine (LOMOTIL) 2.5-0.025 MG tablet, Take 1 tablet by mouth 2 (two) times daily as needed for diarrhea or loose stools., Disp: , Rfl:    halobetasol (ULTRAVATE) 0.05 % cream, Apply topically 2 (two) times daily., Disp: 50 g, Rfl: 11   HYDROcodone-acetaminophen (NORCO) 10-325 MG tablet, Take 1 tablet by mouth every 6 (six) hours as needed for moderate pain (pain score 4-6)., Disp:  120 tablet, Rfl: 0   HYDROcodone-acetaminophen (NORCO) 10-325 MG tablet, Take 1 tablet by mouth every 6 (six) hours as needed for moderate pain (pain score 4-6)., Disp: 120 tablet, Rfl: 0   HYDROcodone-acetaminophen (NORCO) 10-325 MG tablet, Take 1 tablet by mouth every 6 (six) hours as needed for moderate pain (pain score 4-6)., Disp: 120 tablet, Rfl: 0   levothyroxine (SYNTHROID) 75 MCG tablet, Take 75 mcg by mouth daily. Brand name only, Disp: , Rfl:    LORazepam (ATIVAN) 2 MG tablet, Take 1 tablet (2 mg total) by mouth 3 (three) times daily., Disp: 90 tablet, Rfl: 5   Magnesium 100 MG TABS, Take 1 tablet by mouth daily at 6 (six) AM. Taking 400 mg daily, Disp: , Rfl:    meclizine (ANTIVERT) 25 MG tablet, Take 1 tablet (25 mg total) by mouth every 4 (four) hours as needed for dizziness., Disp: 30 tablet, Rfl: 5   Melatonin 3 MG CAPS, Take 9 mg by mouth at bedtime., Disp: , Rfl:    methylphenidate (RITALIN) 10 MG tablet, Take  1 tablet (10 mg total) by mouth in the morning and at bedtime., Disp: 60 tablet, Rfl: 0   methylphenidate (RITALIN) 10 MG tablet, Take 1 tablet (10 mg total) by mouth in the morning and at bedtime., Disp: 60 tablet, Rfl: 0   methylphenidate (RITALIN) 10 MG tablet, Take 1 tablet (10 mg total) by mouth in the morning and at bedtime., Disp: 60 tablet, Rfl: 0   metroNIDAZOLE (METROCREAM) 0.75 % cream, Apply 1 Application topically daily., Disp: 180 g, Rfl: 3   Multiple Vitamin (MULTIVITAMIN) tablet, Take 1 tablet by mouth daily. Centrum 50 +, Disp: , Rfl:    NONFORMULARY OR COMPOUNDED ITEM, APPLY A DIME SIZED AMOUNT TO AFFECTED AREA 4 TIMES DAILY AS NEEDED., Disp: 1 each, Rfl: 6   Oxycodone HCl 20 MG TABS, Take 1 tablet (20 mg total) by mouth every 4 (four) hours as needed (pain)., Disp: 180 tablet, Rfl: 0   Oxycodone HCl 20 MG TABS, Take 1 tablet (20 mg total) by mouth every 4 (four) hours as needed (pain)., Disp: 180 tablet, Rfl: 0   Oxycodone HCl 20 MG TABS, Take 1 tablet (20 mg total) by mouth every 4 (four) hours as needed (pain)., Disp: 180 tablet, Rfl: 0   perphenazine (TRILAFON) 4 MG tablet, TAKE 1 TABLET BY MOUTH EVERY 4 HOURS AS NEEDED FOR MIGRAINES, Disp: 30 tablet, Rfl: 0   Potassium Gluconate 550 MG TABS, Take 1 tablet by mouth daily., Disp: , Rfl:    Probiotic Product (ALIGN PO), Take 1 capsule by mouth daily. , Disp: , Rfl:    rizatriptan (MAXALT) 10 MG tablet, Take 1 tablet (10 mg total) by mouth as needed for migraine. May repeat in 2 hours if needed, Disp: 20 tablet, Rfl: 11   topiramate (TOPAMAX) 50 MG tablet, TAKE THREE TABS DAILY, Disp: 270 tablet, Rfl: 0   zaleplon (SONATA) 10 MG capsule, Take 1 capsule (10 mg total) by mouth at bedtime as needed for sleep, Disp: 90 capsule, Rfl: 1  EXAM:  VITALS per patient if applicable:  GENERAL: alert, oriented, appears well and in no acute distress  HEENT: atraumatic, conjunttiva clear, no obvious abnormalities on inspection of external  nose and ears  NECK: normal movements of the head and neck  LUNGS: on inspection no signs of respiratory distress, breathing rate appears normal, no obvious gross SOB, gasping or wheezing  CV: no obvious cyanosis  MS: moves all visible  extremities without noticeable abnormality  PSYCH/NEURO: pleasant and cooperative, no obvious depression or anxiety, speech and thought processing grossly intact  ASSESSMENT AND PLAN: Pain management.  Indication for chronic opioid: myofacial pain  Medication and dose: Norco 10-325 and Oxycodone 20 mg # pills per month: 120 and 180 Last UDS date: 09-22-22 Opioid Treatment Agreement signed (Y/N): 04-06-17 Opioid Treatment Agreement last reviewed with patient:  03-07-23 [facility] reviewed this encounter (include red flags): Yes Meds were refilled.  Gershon Crane, MD  Discussed the following assessment and plan:  No diagnosis found.     I discussed the assessment and treatment plan with the patient. The patient was provided an opportunity to ask questions and all were answered. The patient agreed with the plan and demonstrated an understanding of the instructions.   The patient was advised to call back or seek an in-person evaluation if the symptoms worsen or if the condition fails to improve as anticipated.      Review of Systems     Objective:   Physical Exam        Assessment & Plan:

## 2023-03-07 NOTE — Telephone Encounter (Signed)
Done today, close chart

## 2023-03-10 NOTE — Telephone Encounter (Signed)
Pt has  a VV with Dr Clent Ridges on 03/07/23 for this

## 2023-03-11 DIAGNOSIS — M810 Age-related osteoporosis without current pathological fracture: Secondary | ICD-10-CM | POA: Diagnosis not present

## 2023-04-04 ENCOUNTER — Other Ambulatory Visit: Payer: Self-pay | Admitting: Family Medicine

## 2023-04-04 NOTE — Telephone Encounter (Signed)
 01/07/2023 LOV  Historical Med

## 2023-04-12 ENCOUNTER — Ambulatory Visit: Payer: Self-pay

## 2023-04-12 NOTE — Telephone Encounter (Signed)
  Chief Complaint: pain and burning  Symptoms: pain and burning urination Frequency: 3 days Pertinent Negatives: Patient denies fever Disposition: [] ED /[] Urgent Care (no appt availability in office) / [x] Appointment(In office/virtual)/ []  [facility]/ [] Home Care/ [x] Refused Recommended Disposition /[] [facility]/ []  Follow-up with PCP Additional Notes: Patient states that for the past 2 weeks she has been laying in bed due to her chronic back pain but the last 3 days she has started having pain and burning when she urinates. States that she cannot come and would just like the same medication that was sent in before for her UTI called in to the pharmacy. States that the medication was for 10days.   States that she has had UTI before and this is exactly what this is.   Copied from CRM [id number]. Topic: Clinical - Red Word Triage >> Apr 12, 2023 12:38 PM Sim Boast F wrote: Red Word that prompted transfer to Nurse Triage: Patient says she has a UTI and she has been in bed for 2 weeks with pain/burning - would like medication sent to pharmacy Reason for Disposition  Age > 50 years  Answer Assessment - Initial Assessment Questions 1. SEVERITY: "How bad is the pain?"  (e.g., Scale 1-10; mild, moderate, or severe)   - MILD (1-3): complains slightly about urination hurting   - MODERATE (4-7): interferes with normal activities     - SEVERE (8-10): excruciating, unwilling or unable to urinate because of the pain      moderate 2. FREQUENCY: "How many times have you had painful urination today?"      3 3. PATTERN: "Is pain present every time you urinate or just sometimes?"      Every time 4. ONSET: "When did the painful urination start?"      3 days 5. FEVER: "Do you have a fever?" If Yes, ask: "What is your temperature, how was it measured, and when did it start?"     no 6. PAST UTI: "Have you had a urine infection before?" If Yes, ask: "When was the last time?" and "What  happened that time?"      yes 7. CAUSE: "What do you think is causing the painful urination?"  (e.g., UTI, scratch, Herpes sore)     uti 8. OTHER SYMPTOMS: "Do you have any other symptoms?" (e.g., blood in urine, flank pain, genital sores, urgency, vaginal discharge)     none  Protocols used: Urination Pain - Female-A-AH

## 2023-04-12 NOTE — Telephone Encounter (Signed)
 Left a message for pt advised to call the office regarding this message

## 2023-04-18 NOTE — Telephone Encounter (Signed)
 Spoke with pt spouse stated that pt has not been complaining of any urinary problems. Advised to call the office if any problem arise.

## 2023-05-03 ENCOUNTER — Other Ambulatory Visit: Payer: Self-pay | Admitting: Family Medicine

## 2023-06-01 ENCOUNTER — Ambulatory Visit: Payer: Self-pay

## 2023-06-01 NOTE — Telephone Encounter (Signed)
 Noted.

## 2023-06-01 NOTE — Telephone Encounter (Signed)
  Chief Complaint: dysuria Symptoms: pain on urination Frequency: each void Pertinent Negatives: Patient's spouse denies fever, other pain, difficulty urinating Disposition: [] ED /[] Urgent Care (no appt availability in office) / [x] Appointment(In office/virtual)/ []  [facility]/ [] Home Care/ [] Refused Recommended Disposition /[] [facility]/ []  Follow-up with PCP Additional Notes:  Spouse Carl (DPR) calling to schedule appointment for symptoms of UTI. She is experiencing dysuria. Burning on urination "she is on fire". Denies all other symptoms. Acute visit advised, requests evening appointment, scheduled with alternate provider on 06/02/23. Educated on care advice as documented in protocol, patient verbalized understanding. Discussed reasons to call back or call for EMS.    Copied from CRM [id number]. Topic: Clinical - Red Word Triage >> Jun 01, 2023  4:03 PM Linda Kent wrote: Red Word that prompted transfer to Nurse Triage: UTI, burning while urinating. Reason for Disposition  Age > 50 years  Protocols used: Urination Pain - Female-A-AH

## 2023-06-02 ENCOUNTER — Ambulatory Visit: Admitting: Internal Medicine

## 2023-06-02 ENCOUNTER — Encounter: Payer: Self-pay | Admitting: Internal Medicine

## 2023-06-02 VITALS — BP 110/60 | HR 103 | Temp 97.7°F

## 2023-06-02 DIAGNOSIS — N3000 Acute cystitis without hematuria: Secondary | ICD-10-CM

## 2023-06-02 DIAGNOSIS — R300 Dysuria: Secondary | ICD-10-CM | POA: Diagnosis not present

## 2023-06-02 LAB — POC URINALSYSI DIPSTICK (AUTOMATED)
Blood, UA: NEGATIVE
Glucose, UA: NEGATIVE
Ketones, UA: NEGATIVE
Protein, UA: NEGATIVE
Spec Grav, UA: 1.015 (ref 1.010–1.025)
Urobilinogen, UA: 1 U/dL
pH, UA: 6 (ref 5.0–8.0)

## 2023-06-02 LAB — URINALYSIS, ROUTINE W REFLEX MICROSCOPIC
Bacteria, UA: NONE SEEN
Bilirubin Urine: NEGATIVE
Hgb urine dipstick: NEGATIVE
Ketones, ur: NEGATIVE
Leukocytes,Ua: NEGATIVE
Nitrite: POSITIVE — AB
RBC / HPF: NONE SEEN (ref 0–?)
Specific Gravity, Urine: 1.01 (ref 1.000–1.030)
Total Protein, Urine: 30 — AB
Urine Glucose: 100 — AB
Urobilinogen, UA: 2 — AB (ref 0.0–1.0)
WBC, UA: NONE SEEN (ref 0–?)
pH: 7 (ref 5.0–8.0)

## 2023-06-02 MED ORDER — CIPROFLOXACIN HCL 250 MG PO TABS: 250.0000 mg | ORAL_TABLET | Freq: Two times a day (BID) | ORAL | 0 refills | Status: DC

## 2023-06-02 NOTE — Progress Notes (Signed)
 Established Patient Office Visit     CC/Reason for Visit: Burning with urination  HPI: Linda Kent is a 78 y.o. female who is coming in today for the above mentioned reasons.  Has been experiencing dysuria for close to 2 weeks now.  In the last couple days has also had frequency of urine.  She is requesting Cipro  as this is "the only antibiotic that works for her UTIs".   Past Medical/Surgical History: Past Medical History:  Diagnosis Date   Acne rosacea    ADD (attention deficit disorder)    Back pain    Chronic insomnia    Colon polyps    Depression    Hyperlipidemia    Hypothyroidism    sees Dr. Clevester Dally    IBS (irritable bowel syndrome)    Migraines    sees Dr. Reinhold Carbine    Myofascial pain syndrome    sees Dr. Lavonia Powers Philips    Osteoporosis    sees Dr. Clevester Dally    Post menopausal syndrome    Recurrent cystitis    after intercourse, controlled with macrobid     Routine gynecological examination    sees [facility]     Past Surgical History:  Procedure Laterality Date   ABDOMINAL HYSTERECTOMY     parital for prolapse   APPENDECTOMY     COLONOSCOPY  04/07/2021   per Dr. Cherryl Corona, adenomatous polyp, no repeats needed   OVARIAN CYST REMOVAL     excised on left    TONSILLECTOMY     UPPER GASTROINTESTINAL ENDOSCOPY      Social History:  reports that she has never smoked. She has never used smokeless tobacco. She reports that she does not drink alcohol and does not use drugs.  Allergies: Allergies  Allergen Reactions   Ezetimibe  Other (See Comments)    Difficulty breathing   Atorvastatin  Calcium  [Atorvastatin ]     Caused chest pain, labored breathing, fatigue    Crestor  [Rosuvastatin  Calcium ] Other (See Comments)    Chest pain, labored breathing   Cephalexin Rash    Family History:  Family History  Problem Relation Age of Onset   Other Sister        low serotonin levels   Colon polyps Sister    Other Brother        low serotonin  levels    Colon cancer Paternal Uncle    Colon cancer Other    Cancer Other 70       puncle decreased from colon cancer    Rectal cancer Neg Hx    Stomach cancer Neg Hx      Current Outpatient Medications:    ciprofloxacin  (CIPRO ) 250 MG tablet, Take 1 tablet (250 mg total) by mouth 2 (two) times daily for 7 days., Disp: 14 tablet, Rfl: 0   Ascorbic Acid (VITAMIN C) 1000 MG tablet, Take 1,000 mg by mouth 2 (two) times daily. Pt taking 5,000 mg, Disp: , Rfl:    aspirin 81 MG EC tablet, Take 81 mg by mouth daily., Disp: , Rfl:    Calcium  1500 MG tablet, Take 1,500 mg by mouth daily. With Vitamin D, Disp: , Rfl:    denosumab  (PROLIA ) 60 MG/ML SOLN injection, Inject 60 mg into the skin every 6 (six) months. Administer in upper arm, thigh, or abdomen, Disp: , Rfl:    diphenhydrAMINE (BENADRYL) 25 mg capsule, Take 25 mg by mouth every 6 (six) hours as needed., Disp: , Rfl:    diphenoxylate -atropine  (  LOMOTIL ) 2.5-0.025 MG tablet, Take 2 tablets by mouth 4 (four) times daily as needed for diarrhea or loose stools., Disp: 120 tablet, Rfl: 5   halobetasol  (ULTRAVATE ) 0.05 % cream, Apply topically 2 (two) times daily., Disp: 50 g, Rfl: 11   HYDROcodone -acetaminophen  (NORCO) 10-325 MG tablet, Take 1 tablet by mouth every 6 (six) hours as needed for moderate pain (pain score 4-6)., Disp: 120 tablet, Rfl: 0   HYDROcodone -acetaminophen  (NORCO) 10-325 MG tablet, Take 1 tablet by mouth every 6 (six) hours as needed for moderate pain (pain score 4-6)., Disp: 120 tablet, Rfl: 0   HYDROcodone -acetaminophen  (NORCO) 10-325 MG tablet, Take 1 tablet by mouth every 6 (six) hours as needed for moderate pain (pain score 4-6)., Disp: 120 tablet, Rfl: 0   levothyroxine (SYNTHROID) 75 MCG tablet, Take 75 mcg by mouth daily. Brand name only, Disp: , Rfl:    LORazepam  (ATIVAN ) 2 MG tablet, Take 1 tablet (2 mg total) by mouth 3 (three) times daily., Disp: 90 tablet, Rfl: 5   Magnesium 100 MG TABS, Take 1 tablet by mouth  daily at 6 (six) AM. Taking 400 mg daily, Disp: , Rfl:    meclizine  (ANTIVERT ) 25 MG tablet, Take 1 tablet (25 mg total) by mouth every 4 (four) hours as needed for dizziness., Disp: 30 tablet, Rfl: 5   Melatonin 3 MG CAPS, Take 9 mg by mouth at bedtime., Disp: , Rfl:    methylphenidate  (RITALIN ) 10 MG tablet, Take 1 tablet (10 mg total) by mouth in the morning and at bedtime., Disp: 60 tablet, Rfl: 0   methylphenidate  (RITALIN ) 10 MG tablet, Take 1 tablet (10 mg total) by mouth in the morning and at bedtime., Disp: 60 tablet, Rfl: 0   methylphenidate  (RITALIN ) 10 MG tablet, Take 1 tablet (10 mg total) by mouth in the morning and at bedtime., Disp: 60 tablet, Rfl: 0   metroNIDAZOLE  (METROCREAM ) 0.75 % cream, Apply 1 Application topically daily., Disp: 180 g, Rfl: 3   Multiple Vitamin (MULTIVITAMIN) tablet, Take 1 tablet by mouth daily. Centrum 50 +, Disp: , Rfl:    NONFORMULARY OR COMPOUNDED ITEM, APPLY A DIME SIZED AMOUNT TO AFFECTED AREA 4 TIMES DAILY AS NEEDED., Disp: 1 each, Rfl: 6   Oxycodone  HCl 20 MG TABS, Take 1 tablet (20 mg total) by mouth every 4 (four) hours as needed (pain)., Disp: 180 tablet, Rfl: 0   Oxycodone  HCl 20 MG TABS, Take 1 tablet (20 mg total) by mouth every 4 (four) hours as needed (pain)., Disp: 180 tablet, Rfl: 0   Oxycodone  HCl 20 MG TABS, Take 1 tablet (20 mg total) by mouth every 4 (four) hours as needed (pain)., Disp: 180 tablet, Rfl: 0   perphenazine  (TRILAFON ) 4 MG tablet, TAKE 1 TABLET BY MOUTH EVERY 4 HOURS AS NEEDED FOR MIGRAINES, Disp: 30 tablet, Rfl: 0   Potassium Gluconate 550 MG TABS, Take 1 tablet by mouth daily., Disp: , Rfl:    Probiotic Product (ALIGN PO), Take 1 capsule by mouth daily. , Disp: , Rfl:    rizatriptan  (MAXALT ) 10 MG tablet, Take 1 tablet (10 mg total) by mouth as needed for migraine. May repeat in 2 hours if needed, Disp: 20 tablet, Rfl: 11   topiramate  (TOPAMAX ) 50 MG tablet, TAKE THREE TABlets DAILY, Disp: 270 tablet, Rfl: 0   zaleplon   (SONATA ) 10 MG capsule, Take 1 capsule (10 mg total) by mouth at bedtime as needed for sleep, Disp: 90 capsule, Rfl: 1  Review of Systems:  Negative unless indicated in HPI.   Physical Exam: Vitals:   06/02/23 1418  BP: 110/60  Pulse: (!) 103  Temp: 97.7 F (36.5 C)  TempSrc: Oral  SpO2: 96%    There is no height or weight on file to calculate BMI.    Impression and Plan:  Dysuria -     POCT Urinalysis Dipstick (Automated) -     Urinalysis; Future -     Urine Culture; Future  Acute cystitis without hematuria -     Ciprofloxacin  HCl; Take 1 tablet (250 mg total) by mouth 2 (two) times daily for 7 days.  Dispense: 14 tablet; Refill: 0  -In office urine dipstick is positive for nitrates and leukocytes.  Sent for urine culture, treat with Cipro  for 7 days.   Time spent:21 minutes reviewing chart, interviewing and examining patient and formulating plan of care.     Marguerita Shih, MD [facility]

## 2023-06-04 LAB — URINE CULTURE
MICRO NUMBER:: 16462539
SPECIMEN QUALITY:: ADEQUATE

## 2023-06-06 ENCOUNTER — Ambulatory Visit: Payer: Self-pay | Admitting: Internal Medicine

## 2023-06-07 ENCOUNTER — Telehealth: Payer: Self-pay | Admitting: *Deleted

## 2023-06-07 MED ORDER — NITROFURANTOIN MONOHYD MACRO 100 MG PO CAPS: 100.0000 mg | ORAL_CAPSULE | Freq: Two times a day (BID) | ORAL | 0 refills | Status: DC

## 2023-06-07 NOTE — Telephone Encounter (Signed)
 Copied from CRM [id number]. Topic: General - Other >> Jun 06, 2023  1:46 PM Albertha Alosa wrote: Reason for CRM: Patient called in returning call to Dr.Hernandez and her nurses regarding her visit last week

## 2023-06-07 NOTE — Telephone Encounter (Signed)
 Spoke to patient,  see lab results.

## 2023-06-08 ENCOUNTER — Ambulatory Visit: Admitting: Family Medicine

## 2023-06-09 ENCOUNTER — Encounter: Payer: Self-pay | Admitting: Family Medicine

## 2023-06-09 ENCOUNTER — Telehealth (INDEPENDENT_AMBULATORY_CARE_PROVIDER_SITE_OTHER): Admitting: Family Medicine

## 2023-06-09 DIAGNOSIS — F1190 Opioid use, unspecified, uncomplicated: Secondary | ICD-10-CM

## 2023-06-09 DIAGNOSIS — N390 Urinary tract infection, site not specified: Secondary | ICD-10-CM | POA: Diagnosis not present

## 2023-06-09 DIAGNOSIS — M7918 Myalgia, other site: Secondary | ICD-10-CM

## 2023-06-09 MED ORDER — HYDROCODONE-ACETAMINOPHEN 10-325 MG PO TABS: 1.0000 | ORAL_TABLET | Freq: Four times a day (QID) | ORAL | 0 refills | Status: DC | PRN

## 2023-06-09 MED ORDER — OXYCODONE HCL 20 MG PO TABS: 20.0000 mg | ORAL_TABLET | ORAL | 0 refills | Status: DC | PRN

## 2023-06-09 MED ORDER — METHYLPHENIDATE HCL 10 MG PO TABS: 10.0000 mg | ORAL_TABLET | Freq: Two times a day (BID) | ORAL | 0 refills | Status: DC

## 2023-06-09 MED ORDER — FLUCONAZOLE 150 MG PO TABS: 150.0000 mg | ORAL_TABLET | Freq: Every day | ORAL | 11 refills | Status: DC | PRN

## 2023-06-09 MED ORDER — CIPROFLOXACIN HCL 500 MG PO TABS: 500.0000 mg | ORAL_TABLET | Freq: Two times a day (BID) | ORAL | 0 refills | Status: AC

## 2023-06-09 NOTE — Progress Notes (Signed)
 Subjective:    Patient ID: Linda Kent, female    DOB: 04/13/1945, 78 y.o.   MRN: [id number]  HPI Virtual Visit via Video Note  I connected with the patient on 06/09/23 at 11:00 AM EDT by a video enabled telemedicine application and verified that I am speaking with the correct person using two identifiers.  Location patient: home Location provider:work or home office Persons participating in the virtual visit: patient, provider  I discussed the limitations of evaluation and management by telemedicine and the availability of in person appointments. The patient expressed understanding and agreed to proceed.   HPI: Here for pain management. She had been doing fairly well until she recently developed a UTI. A urine culture grew Enterococcus faecalis. She was treated with 7 days of Cipro  250 mg, but she she still has urinary burning and urgency. No fever. This infection has increased the intensity of her pain levels.    ROS: See pertinent positives and negatives per HPI.  Past Medical History:  Diagnosis Date   Acne rosacea    ADD (attention deficit disorder)    Back pain    Chronic insomnia    Colon polyps    Depression    Hyperlipidemia    Hypothyroidism    sees Dr. Clevester Dally    IBS (irritable bowel syndrome)    Migraines    sees Dr. Reinhold Carbine    Myofascial pain syndrome    sees Dr. Lavonia Powers Philips    Osteoporosis    sees Dr. Clevester Dally    Post menopausal syndrome    Recurrent cystitis    after intercourse, controlled with macrobid     Routine gynecological examination    sees [facility]     Past Surgical History:  Procedure Laterality Date   ABDOMINAL HYSTERECTOMY     parital for prolapse   APPENDECTOMY     COLONOSCOPY  04/07/2021   per Dr. Cherryl Corona, adenomatous polyp, no repeats needed   OVARIAN CYST REMOVAL     excised on left    TONSILLECTOMY     UPPER GASTROINTESTINAL ENDOSCOPY      Family History  Problem Relation Age of Onset   Other  Sister        low serotonin levels   Colon polyps Sister    Other Brother        low serotonin levels    Colon cancer Paternal Uncle    Colon cancer Other    Cancer Other 70       puncle decreased from colon cancer    Rectal cancer Neg Hx    Stomach cancer Neg Hx      Current Outpatient Medications:    Ascorbic Acid (VITAMIN C) 1000 MG tablet, Take 1,000 mg by mouth 2 (two) times daily. Pt taking 5,000 mg, Disp: , Rfl:    aspirin 81 MG EC tablet, Take 81 mg by mouth daily., Disp: , Rfl:    Calcium  1500 MG tablet, Take 1,500 mg by mouth daily. With Vitamin D, Disp: , Rfl:    ciprofloxacin  (CIPRO ) 250 MG tablet, Take 1 tablet (250 mg total) by mouth 2 (two) times daily for 7 days., Disp: 14 tablet, Rfl: 0   denosumab  (PROLIA ) 60 MG/ML SOLN injection, Inject 60 mg into the skin every 6 (six) months. Administer in upper arm, thigh, or abdomen, Disp: , Rfl:    diphenhydrAMINE (BENADRYL) 25 mg capsule, Take 25 mg by mouth every 6 (six) hours as needed., Disp: , Rfl:  diphenoxylate -atropine  (LOMOTIL ) 2.5-0.025 MG tablet, Take 2 tablets by mouth 4 (four) times daily as needed for diarrhea or loose stools., Disp: 120 tablet, Rfl: 5   halobetasol  (ULTRAVATE ) 0.05 % cream, Apply topically 2 (two) times daily., Disp: 50 g, Rfl: 11   HYDROcodone -acetaminophen  (NORCO) 10-325 MG tablet, Take 1 tablet by mouth every 6 (six) hours as needed for moderate pain (pain score 4-6)., Disp: 120 tablet, Rfl: 0   HYDROcodone -acetaminophen  (NORCO) 10-325 MG tablet, Take 1 tablet by mouth every 6 (six) hours as needed for moderate pain (pain score 4-6)., Disp: 120 tablet, Rfl: 0   HYDROcodone -acetaminophen  (NORCO) 10-325 MG tablet, Take 1 tablet by mouth every 6 (six) hours as needed for moderate pain (pain score 4-6)., Disp: 120 tablet, Rfl: 0   levothyroxine (SYNTHROID) 75 MCG tablet, Take 75 mcg by mouth daily. Brand name only, Disp: , Rfl:    LORazepam  (ATIVAN ) 2 MG tablet, Take 1 tablet (2 mg total) by mouth 3  (three) times daily., Disp: 90 tablet, Rfl: 5   Magnesium 100 MG TABS, Take 1 tablet by mouth daily at 6 (six) AM. Taking 400 mg daily, Disp: , Rfl:    meclizine  (ANTIVERT ) 25 MG tablet, Take 1 tablet (25 mg total) by mouth every 4 (four) hours as needed for dizziness., Disp: 30 tablet, Rfl: 5   Melatonin 3 MG CAPS, Take 9 mg by mouth at bedtime., Disp: , Rfl:    methylphenidate  (RITALIN ) 10 MG tablet, Take 1 tablet (10 mg total) by mouth in the morning and at bedtime., Disp: 60 tablet, Rfl: 0   methylphenidate  (RITALIN ) 10 MG tablet, Take 1 tablet (10 mg total) by mouth in the morning and at bedtime., Disp: 60 tablet, Rfl: 0   methylphenidate  (RITALIN ) 10 MG tablet, Take 1 tablet (10 mg total) by mouth in the morning and at bedtime., Disp: 60 tablet, Rfl: 0   metroNIDAZOLE  (METROCREAM ) 0.75 % cream, Apply 1 Application topically daily., Disp: 180 g, Rfl: 3   Multiple Vitamin (MULTIVITAMIN) tablet, Take 1 tablet by mouth daily. Centrum 50 +, Disp: , Rfl:    nitrofurantoin , macrocrystal-monohydrate, (MACROBID ) 100 MG capsule, Take 1 capsule (100 mg total) by mouth 2 (two) times daily., Disp: 14 capsule, Rfl: 0   NONFORMULARY OR COMPOUNDED ITEM, APPLY A DIME SIZED AMOUNT TO AFFECTED AREA 4 TIMES DAILY AS NEEDED., Disp: 1 each, Rfl: 6   Oxycodone  HCl 20 MG TABS, Take 1 tablet (20 mg total) by mouth every 4 (four) hours as needed (pain)., Disp: 180 tablet, Rfl: 0   Oxycodone  HCl 20 MG TABS, Take 1 tablet (20 mg total) by mouth every 4 (four) hours as needed (pain)., Disp: 180 tablet, Rfl: 0   Oxycodone  HCl 20 MG TABS, Take 1 tablet (20 mg total) by mouth every 4 (four) hours as needed (pain)., Disp: 180 tablet, Rfl: 0   perphenazine  (TRILAFON ) 4 MG tablet, TAKE 1 TABLET BY MOUTH EVERY 4 HOURS AS NEEDED FOR MIGRAINES, Disp: 30 tablet, Rfl: 0   Potassium Gluconate 550 MG TABS, Take 1 tablet by mouth daily., Disp: , Rfl:    Probiotic Product (ALIGN PO), Take 1 capsule by mouth daily. , Disp: , Rfl:     rizatriptan  (MAXALT ) 10 MG tablet, Take 1 tablet (10 mg total) by mouth as needed for migraine. May repeat in 2 hours if needed, Disp: 20 tablet, Rfl: 11   topiramate  (TOPAMAX ) 50 MG tablet, TAKE THREE TABlets DAILY, Disp: 270 tablet, Rfl: 0   zaleplon  (SONATA ) 10  MG capsule, Take 1 capsule (10 mg total) by mouth at bedtime as needed for sleep, Disp: 90 capsule, Rfl: 1  EXAM:  VITALS per patient if applicable:  GENERAL: alert, oriented, appears well and in no acute distress  HEENT: atraumatic, conjunttiva clear, no obvious abnormalities on inspection of external nose and ears  NECK: normal movements of the head and neck  LUNGS: on inspection no signs of respiratory distress, breathing rate appears normal, no obvious gross SOB, gasping or wheezing  CV: no obvious cyanosis  MS: moves all visible extremities without noticeable abnormality  PSYCH/NEURO: pleasant and cooperative, no obvious depression or anxiety, speech and thought processing grossly intact  ASSESSMENT AND PLAN: Pain management.  Indication for chronic opioid: myofascial pain Medication and dose: Norco 10-325 and Oxycodone  20 mg # pills per month: 120 and 180 Last UDS date: 09-22-22 Opioid Treatment Agreement signed (Y/N): 04-06-17 Opioid Treatment Agreement last reviewed with patient:  06-09-23 [facility] reviewed this encounter (include red flags): Yes We refilled her pain medications. We will also treat the UTI with 10 days of Cipro  500 mg BID. Corita Diego, MD  Discussed the following assessment and plan:  No diagnosis found.     I discussed the assessment and treatment plan with the patient. The patient was provided an opportunity to ask questions and all were answered. The patient agreed with the plan and demonstrated an understanding of the instructions.   The patient was advised to call back or seek an in-person evaluation if the symptoms worsen or if the condition fails to improve as anticipated.      Review  of Systems     Objective:   Physical Exam        Assessment & Plan:

## 2023-07-13 ENCOUNTER — Encounter: Payer: Self-pay | Admitting: Family Medicine

## 2023-07-13 ENCOUNTER — Ambulatory Visit: Payer: Self-pay

## 2023-07-13 ENCOUNTER — Telehealth (INDEPENDENT_AMBULATORY_CARE_PROVIDER_SITE_OTHER): Admitting: Family Medicine

## 2023-07-13 DIAGNOSIS — R197 Diarrhea, unspecified: Secondary | ICD-10-CM | POA: Diagnosis not present

## 2023-07-13 DIAGNOSIS — K581 Irritable bowel syndrome with constipation: Secondary | ICD-10-CM | POA: Diagnosis not present

## 2023-07-13 NOTE — Progress Notes (Signed)
 Patient ID: SAVANHA ISLAND, female   DOB: 1945/08/06, 78 y.o.   MRN: [id number]   Virtual Visit via Telephone Note  I connected with Linda Kent on 07/13/23 at  5:00 PM EDT by telephone and verified that I am speaking with the correct person using two identifiers.   I discussed the limitations, risks, security and privacy concerns of performing an evaluation and management service by telephone and the availability of in person appointments. I also discussed with the patient that there may be a patient responsible charge related to this service. The patient expressed understanding and agreed to proceed.  Location patient: home Location provider: work or home office Participants present for the call: patient, provider Patient did not have a visit in the prior 7 days to address this/these issue(s).   History of Present Illness: Linda Kent has chronic problems including migraine headaches, IBS, hypothyroidism, chronic myofascial pain syndrome treated with chronic opioids, history of anxiety, chronic insomnia.  She has had history of intermittent diarrhea in the past and had fairly extensive GI workup back in 2023.  Colonoscopy then was basically normal except for small polyp.  She has had previous screening for celiac disease which has been negative.  She relates history of IBS.  For 2 to 3 months she has had somewhat intermittent diarrhea.  Stools vary from loose to watery.  Can be as much as 4 to 5/day.  No bloody stools.  Has recently been taking Lomotil  which does help.  She has had to change her diet dramatically.  She has had to pretty much give up fruit and other higher fiber foods.  She states she has been eating more crackers and biscuits and weight has gone up because of that.  Denies any recent fever.  No recent travels.  Was treated for UTI with Cipro  back in May and eventually switched to Macrobid  and then had subsequent repeat treatment with Cipro .  No known history of lactose intolerance  but she generally avoids lactose. No recent travels.  She does have hypothyroidism and is on replacement.  Last TSH was in January and normal.  Past Medical History:  Diagnosis Date   Acne rosacea    ADD (attention deficit disorder)    Back pain    Chronic insomnia    Colon polyps    Depression    Hyperlipidemia    Hypothyroidism    sees Dr. Gordy Blanch    IBS (irritable bowel syndrome)    Migraines    sees Dr. Layman Meissner    Myofascial pain syndrome    sees Dr. Oneil Philips    Osteoporosis    sees Dr. Gordy Blanch    Post menopausal syndrome    Recurrent cystitis    after intercourse, controlled with macrobid     Routine gynecological examination    sees [facility]    Past Surgical History:  Procedure Laterality Date   ABDOMINAL HYSTERECTOMY     parital for prolapse   APPENDECTOMY     COLONOSCOPY  04/07/2021   per Dr. Stacia, adenomatous polyp, no repeats needed   OVARIAN CYST REMOVAL     excised on left    TONSILLECTOMY     UPPER GASTROINTESTINAL ENDOSCOPY      reports that she has never smoked. She has never used smokeless tobacco. She reports that she does not drink alcohol and does not use drugs. family history includes Cancer (age of onset: 48) in an other family member; Colon cancer in her paternal  uncle and another family member; Colon polyps in her sister; Other in her brother and sister. Allergies  Allergen Reactions   Ezetimibe  Other (See Comments)    Difficulty breathing   Atorvastatin  Calcium  [Atorvastatin ]     Caused chest pain, labored breathing, fatigue    Crestor  [Rosuvastatin  Calcium ] Other (See Comments)    Chest pain, labored breathing   Cephalexin Rash      Observations/Objective: Patient sounds cheerful and well on the phone. I do not appreciate any SOB. Speech and thought processing are grossly intact. Patient reported vitals:  Assessment and Plan:  Acute on chronic diarrhea.  She has history of known IBS.  Relates a couple  month history of worsening diarrhea symptoms but somewhat intermittent.  She denies any red flags such as appetite change, weight loss, fever, bloody stools.  She did have recent risk factor of more than 1 course of antibiotic (Cipro ).  Currently on Lomotil  which does provide some relief.  We recommend the following  -Discussed appropriate diet.  She is trying to follow low residue diet currently in low-fat and low concentrated sugar diet - Recommend checking some labs with CMP, GI pathogen panel stool study, and stool C. Difficile.  She states she may have difficulty getting in tomorrow but hopefully can no later than next week. - Stressed importance of staying well-hydrated and adequate electrolyte replacement - Follow-up immediately for any fever, bloody stools, or other concerns  Follow Up Instructions:     99441 5-10 [id number] 11-20 [id number] 21-30 I did not refer this patient for an OV in the next 24 hours for this/these issue(s).  I discussed the assessment and treatment plan with the patient. The patient was provided an opportunity to ask questions and all were answered. The patient agreed with the plan and demonstrated an understanding of the instructions.   The patient was advised to call back or seek an in-person evaluation if the symptoms worsen or if the condition fails to improve as anticipated.  I provided 34 minutes of non-face-to-face time during this encounter.   Linda Scarlet, MD

## 2023-07-13 NOTE — Telephone Encounter (Signed)
 Pt had a VV with Dr Micheal today for this problem

## 2023-07-13 NOTE — Telephone Encounter (Signed)
 FYI Only or Action Required?: Action required by provider: clinical question for provider and update on patient condition.  Patient was last seen in primary care on 06/09/2023 by Linda Kent LABOR, MD. Called Nurse Triage reporting Diarrhea. Symptoms began several months ago. Interventions attempted: Prescription medications: lomotil , Rest, hydration, or home remedies, and Dietary changes. Symptoms are: gradually worsening.  Triage Disposition: See PCP Within 2 Weeks  Patient/caregiver understands and will follow disposition?: No, wishes to speak with PCP  Message from [name] sent at 07/13/2023  1:21 PM EDT  Patient is calling because she's experienced diarrhea for about 2-3 months on and off but feels like it is getting worse. She has not been able to eat anything beside bread and crackers which helps.   Reason for Disposition  Diarrhea is a chronic symptom (recurrent or ongoing AND present > 4 weeks)  Answer Assessment - Initial Assessment Questions 1. DIARRHEA SEVERITY: How bad is the diarrhea? How many more stools have you had in the past 24 hours than normal?    - NO DIARRHEA (SCALE 0)   - MILD (SCALE 1-3): Few loose or mushy BMs; increase of 1-3 stools over normal daily number of stools; mild increase in ostomy output.   -  MODERATE (SCALE 4-7): Increase of 4-6 stools daily over normal; moderate increase in ostomy output.   -  SEVERE (SCALE 8-10; OR WORST POSSIBLE): Increase of 7 or more stools daily over normal; moderate increase in ostomy output; incontinence.     Varies-can be none, other days several times per day.  2. ONSET: When did the diarrhea begin?      2-3 months ago 3. BM CONSISTENCY: How loose or watery is the diarrhea?      varies 4. VOMITING: Are you also vomiting? If Yes, ask: How many times in the past 24 hours?      Denies  5. ABDOMEN PAIN: Are you having any abdomen pain? If Yes, ask: What does it feel like? (e.g., crampy, dull, intermittent,  constant)      crampy 6. ABDOMEN PAIN SEVERITY: If present, ask: How bad is the pain?  (e.g., Scale 1-10; mild, moderate, or severe)   - MILD (1-3): doesn't interfere with normal activities, abdomen soft and not tender to touch    - MODERATE (4-7): interferes with normal activities or awakens from sleep, abdomen tender to touch    - SEVERE (8-10): excruciating pain, doubled over, unable to do any normal activities       mild 7. ORAL INTAKE: If vomiting, Have you been able to drink liquids? How much liquids have you had in the past 24 hours?     Drinking well.  8. HYDRATION: Any signs of dehydration? (e.g., dry mouth [not just dry lips], too weak to stand, dizziness, new weight loss) When did you last urinate?     Denies   9. OTHER SYMPTOMS: Do you have any other symptoms? (e.g., fever, blood in stool)       Poor appetite, can only eat crackers, bread products, banana muffins. Occasional migraine-maintained on medication   Additional info: History of IBS. Lomotil  helps but only for the day, wondering if she should be taking it daily instead of PRN or if a different medication can be prescribed.  She is refusing appointment until her diarrhea is under control.  Protocols used: [facility]

## 2023-07-13 NOTE — Telephone Encounter (Signed)
 Spoke with the patient and informed her a visit is needed for evaluation.  Phone visit was scheduled with Dr Micheal today as patient declined coming in and PCP does not have any openings soon.

## 2023-07-20 ENCOUNTER — Telehealth: Payer: Self-pay | Admitting: *Deleted

## 2023-07-20 NOTE — Telephone Encounter (Signed)
 Communication  Reason for CRM: Patient called in stated that she is feeling better, and doesn't need the labs that was ordered anymore

## 2023-07-21 NOTE — Telephone Encounter (Signed)
 Noted

## 2023-07-22 ENCOUNTER — Other Ambulatory Visit: Payer: Self-pay | Admitting: Family Medicine

## 2023-07-28 ENCOUNTER — Other Ambulatory Visit: Payer: Self-pay | Admitting: Family Medicine

## 2023-09-13 ENCOUNTER — Other Ambulatory Visit: Payer: Self-pay | Admitting: Family Medicine

## 2023-09-13 NOTE — Telephone Encounter (Signed)
 Done

## 2023-09-14 ENCOUNTER — Other Ambulatory Visit: Payer: Self-pay | Admitting: Family Medicine

## 2023-09-16 NOTE — Telephone Encounter (Signed)
 Name of Medication: Oxycodone  20mg   Name of Pharmacy: [facility] [place], [place] - [address] [place] [address]  Last [place] or Written Date and Quantity: 06/09/2023 180tab jill Last Office Visit and Type: 07/13/23 for IBS Next Office Visit and Type: 09/20/23 for follow up Last Controlled Substance Agreement Date: 04/12/2017 Last UDS: 09/22/2022

## 2023-09-20 ENCOUNTER — Telehealth (INDEPENDENT_AMBULATORY_CARE_PROVIDER_SITE_OTHER): Admitting: Family Medicine

## 2023-09-20 ENCOUNTER — Encounter: Payer: Self-pay | Admitting: Family Medicine

## 2023-09-20 DIAGNOSIS — G8929 Other chronic pain: Secondary | ICD-10-CM | POA: Diagnosis not present

## 2023-09-20 DIAGNOSIS — M7918 Myalgia, other site: Secondary | ICD-10-CM | POA: Diagnosis not present

## 2023-09-20 MED ORDER — HYDROCODONE-ACETAMINOPHEN 10-325 MG PO TABS: 1.0000 | ORAL_TABLET | Freq: Four times a day (QID) | ORAL | 0 refills | Status: DC | PRN

## 2023-09-20 MED ORDER — OXYCODONE HCL 20 MG PO TABS: 20.0000 mg | ORAL_TABLET | ORAL | 0 refills | Status: DC | PRN

## 2023-09-20 NOTE — Progress Notes (Signed)
 Subjective:    Patient ID: Linda Kent, female    DOB: 15-Apr-1945, 78 y.o.   MRN: [id number]  HPI Virtual Visit via Video Note  I connected with the patient on 09/20/23 at  1:00 PM EDT by a video enabled telemedicine application and verified that I am speaking with the correct person using two identifiers.  Location patient: home Location provider:work or home office Persons participating in the virtual visit: patient, provider  I discussed the limitations of evaluation and management by telemedicine and the availability of in person appointments. The patient expressed understanding and agreed to proceed.   HPI: Here for pain management. She is doing well.    ROS: See pertinent positives and negatives per HPI.  Past Medical History:  Diagnosis Date   Acne rosacea    ADD (attention deficit disorder)    Back pain    Chronic insomnia    Colon polyps    Depression    Hyperlipidemia    Hypothyroidism    sees Dr. Gordy Blanch    IBS (irritable bowel syndrome)    Migraines    sees Dr. Layman Meissner    Myofascial pain syndrome    sees Dr. Oneil Philips    Osteoporosis    sees Dr. Gordy Blanch    Post menopausal syndrome    Recurrent cystitis    after intercourse, controlled with macrobid     Routine gynecological examination    sees [facility]     Past Surgical History:  Procedure Laterality Date   ABDOMINAL HYSTERECTOMY     [facility] for prolapse   APPENDECTOMY     COLONOSCOPY  04/07/2021   per Dr. Stacia, adenomatous polyp, no repeats needed   OVARIAN CYST REMOVAL     excised on left    TONSILLECTOMY     UPPER GASTROINTESTINAL ENDOSCOPY      Family History  Problem Relation Age of Onset   Other Sister        low serotonin levels   Colon polyps Sister    Other Brother        low serotonin levels    Colon cancer Paternal Uncle    Colon cancer Other    Cancer Other 70       puncle decreased from colon cancer    Rectal cancer Neg Hx    Stomach cancer  Neg Hx      Current Outpatient Medications:    Ascorbic Acid (VITAMIN C) 1000 MG tablet, Take 1,000 mg by mouth 2 (two) times daily. Pt taking 5,000 mg, Disp: , Rfl:    aspirin 81 MG EC tablet, Take 81 mg by mouth daily., Disp: , Rfl:    Calcium  1500 MG tablet, Take 1,500 mg by mouth daily. With Vitamin D, Disp: , Rfl:    denosumab  (PROLIA ) 60 MG/ML SOLN injection, Inject 60 mg into the skin every 6 (six) months. Administer in upper arm, thigh, or abdomen, Disp: , Rfl:    diphenhydrAMINE (BENADRYL) 25 mg capsule, Take 25 mg by mouth every 6 (six) hours as needed., Disp: , Rfl:    diphenoxylate -atropine  (LOMOTIL ) 2.5-0.025 MG tablet, Take 2 tablets by mouth 4 (four) times daily as needed for diarrhea or loose stools., Disp: 120 tablet, Rfl: 5   fluconazole  (DIFLUCAN ) 150 MG tablet, Take 1 tablet (150 mg total) by mouth daily as needed (yeast infections)., Disp: 1 tablet, Rfl: 11   halobetasol  (ULTRAVATE ) 0.05 % cream, Apply topically 2 (two) times daily., Disp: 50 g, Rfl:  11   HYDROcodone -acetaminophen  (NORCO) 10-325 MG tablet, Take 1 tablet by mouth every 6 (six) hours as needed for moderate pain (pain score 4-6)., Disp: 120 tablet, Rfl: 0   HYDROcodone -acetaminophen  (NORCO) 10-325 MG tablet, Take 1 tablet by mouth every 6 (six) hours as needed for moderate pain (pain score 4-6)., Disp: 120 tablet, Rfl: 0   HYDROcodone -acetaminophen  (NORCO) 10-325 MG tablet, Take 1 tablet by mouth every 6 (six) hours as needed for moderate pain (pain score 4-6)., Disp: 120 tablet, Rfl: 0   levothyroxine (SYNTHROID) 75 MCG tablet, Take 75 mcg by mouth daily. Brand name only, Disp: , Rfl:    LORazepam  (ATIVAN ) 2 MG tablet, Take 1 tablet (2 mg total) by mouth 3 (three) times daily., Disp: 90 tablet, Rfl: 5   Magnesium 100 MG TABS, Take 1 tablet by mouth daily at 6 (six) AM. Taking 400 mg daily, Disp: , Rfl:    meclizine  (ANTIVERT ) 25 MG tablet, Take 1 tablet (25 mg total) by mouth every 4 (four) hours as needed for  dizziness., Disp: 30 tablet, Rfl: 5   Melatonin 3 MG CAPS, Take 9 mg by mouth at bedtime., Disp: , Rfl:    methylphenidate  (RITALIN ) 10 MG tablet, Take 1 tablet (10 mg total) by mouth in the morning and at bedtime., Disp: 60 tablet, Rfl: 0   methylphenidate  (RITALIN ) 10 MG tablet, Take 1 tablet (10 mg total) by mouth in the morning and at bedtime., Disp: 60 tablet, Rfl: 0   methylphenidate  (RITALIN ) 10 MG tablet, Take 1 tablet (10 mg total) by mouth in the morning and at bedtime., Disp: 60 tablet, Rfl: 0   metroNIDAZOLE  (METROCREAM ) 0.75 % cream, Apply 1 Application topically daily., Disp: 180 g, Rfl: 3   Multiple Vitamin (MULTIVITAMIN) tablet, Take 1 tablet by mouth daily. Centrum 50 +, Disp: , Rfl:    NONFORMULARY OR COMPOUNDED ITEM, APPLY A DIME SIZED AMOUNT TO AFFECTED AREA 4 TIMES DAILY AS NEEDED., Disp: 1 each, Rfl: 6   Oxycodone  HCl 20 MG TABS, Take 1 tablet (20 mg total) by mouth every 4 (four) hours as needed (pain)., Disp: 180 tablet, Rfl: 0   Oxycodone  HCl 20 MG TABS, Take 1 tablet (20 mg total) by mouth every 4 (four) hours as needed (pain)., Disp: 180 tablet, Rfl: 0   Oxycodone  HCl 20 MG TABS, Take 1 tablet (20 mg total) by mouth every 4 (four) hours as needed (pain)., Disp: 180 tablet, Rfl: 0   perphenazine  (TRILAFON ) 4 MG tablet, TAKE 1 TABLET BY MOUTH EVERY 4 HOURS AS NEEDED FOR MIGRAINES, Disp: 30 tablet, Rfl: 0   Potassium Gluconate 550 MG TABS, Take 1 tablet by mouth daily., Disp: , Rfl:    Probiotic Product (ALIGN PO), Take 1 capsule by mouth daily. , Disp: , Rfl:    rizatriptan  (MAXALT ) 10 MG tablet, Take 1 tablet (10 mg total) by mouth as needed for migraine. May repeat in 2 hours if needed, Disp: 20 tablet, Rfl: 11   topiramate  (TOPAMAX ) 50 MG tablet, TAKE THREE tablets by mouth DAILY, Disp: 270 tablet, Rfl: 1   zaleplon  (SONATA ) 10 MG capsule, TAKE ONE CAPSULE BY MOUTH AT BEDTIME AS NEEDED FOR SLEEP, Disp: 90 capsule, Rfl: 1   nitrofurantoin , macrocrystal-monohydrate,  (MACROBID ) 100 MG capsule, Take 1 capsule (100 mg total) by mouth 2 (two) times daily., Disp: 14 capsule, Rfl: 0  EXAM:  VITALS per patient if applicable:  GENERAL: alert, oriented, appears well and in no acute distress  HEENT: atraumatic, conjunttiva  clear, no obvious abnormalities on inspection of external nose and ears  NECK: normal movements of the head and neck  LUNGS: on inspection no signs of respiratory distress, breathing rate appears normal, no obvious gross SOB, gasping or wheezing  CV: no obvious cyanosis  MS: moves all visible extremities without noticeable abnormality  PSYCH/NEURO: pleasant and cooperative, no obvious depression or anxiety, speech and thought processing grossly intact  ASSESSMENT AND PLAN: Pain management. Indication for chronic opioid: myofascial pain Medication and dose: Norco 10-325 and Oxycodone  20 mg # pills per month: 120 and 180 Last UDS date: 09-22-22 Opioid Treatment Agreement signed (Y/N): 04-06-17 Opioid Treatment Agreement last reviewed with patient:  09-20-23 [facility] reviewed this encounter (include red flags): Yes Meds were refilled. She will be here later this week for a well exam, and we will get a UDS at that time.  Garnette Olmsted, MD  Discussed the following assessment and plan:  No diagnosis found.     I discussed the assessment and treatment plan with the patient. The patient was provided an opportunity to ask questions and all were answered. The patient agreed with the plan and demonstrated an understanding of the instructions.   The patient was advised to call back or seek an in-person evaluation if the symptoms worsen or if the condition fails to improve as anticipated.      Review of Systems     Objective:   Physical Exam        Assessment & Plan:

## 2023-09-23 ENCOUNTER — Encounter: Admitting: Family Medicine

## 2023-09-27 ENCOUNTER — Encounter: Payer: Self-pay | Admitting: Family Medicine

## 2023-09-27 ENCOUNTER — Ambulatory Visit (INDEPENDENT_AMBULATORY_CARE_PROVIDER_SITE_OTHER): Admitting: Family Medicine

## 2023-09-27 VITALS — BP 100/74 | HR 102 | Temp 98.1°F | Ht 64.0 in | Wt 126.8 lb

## 2023-09-27 DIAGNOSIS — G43909 Migraine, unspecified, not intractable, without status migrainosus: Secondary | ICD-10-CM | POA: Diagnosis not present

## 2023-09-27 DIAGNOSIS — E039 Hypothyroidism, unspecified: Secondary | ICD-10-CM | POA: Diagnosis not present

## 2023-09-27 DIAGNOSIS — E538 Deficiency of other specified B group vitamins: Secondary | ICD-10-CM | POA: Diagnosis not present

## 2023-09-27 DIAGNOSIS — Z23 Encounter for immunization: Secondary | ICD-10-CM | POA: Diagnosis not present

## 2023-09-27 DIAGNOSIS — R739 Hyperglycemia, unspecified: Secondary | ICD-10-CM | POA: Diagnosis not present

## 2023-09-27 DIAGNOSIS — F909 Attention-deficit hyperactivity disorder, unspecified type: Secondary | ICD-10-CM

## 2023-09-27 DIAGNOSIS — F1190 Opioid use, unspecified, uncomplicated: Secondary | ICD-10-CM

## 2023-09-27 DIAGNOSIS — E559 Vitamin D deficiency, unspecified: Secondary | ICD-10-CM | POA: Diagnosis not present

## 2023-09-27 DIAGNOSIS — M7918 Myalgia, other site: Secondary | ICD-10-CM

## 2023-09-27 MED ORDER — MECLIZINE HCL 25 MG PO TABS: 25.0000 mg | ORAL_TABLET | ORAL | 11 refills | Status: AC | PRN

## 2023-09-27 MED ORDER — HALOBETASOL PROPIONATE 0.05 % EX CREA: TOPICAL_CREAM | Freq: Two times a day (BID) | CUTANEOUS | 11 refills | Status: AC

## 2023-09-27 MED ORDER — RIZATRIPTAN BENZOATE 10 MG PO TABS: 10.0000 mg | ORAL_TABLET | ORAL | 11 refills | Status: AC | PRN

## 2023-09-27 MED ORDER — TOPIRAMATE 50 MG PO TABS: 150.0000 mg | ORAL_TABLET | Freq: Every day | ORAL | 1 refills | Status: DC

## 2023-09-27 NOTE — Addendum Note (Signed)
 Addended by: LADONNA INOCENTE SAILOR on: 09/27/2023 04:41 PM   Modules accepted: Orders

## 2023-09-27 NOTE — Progress Notes (Signed)
   Subjective:    Patient ID: Linda Kent, female    DOB: 10-Mar-1945, 78 y.o.   MRN: [id number]  HPI Here to follow up on issues. She is in a pain management program with us  for her myofascial pain syndrome. Her migraines have been fairly well controlled. Her ADHD is well controlled. She sees Dr. Tobie at [facility] for her osteoporosis.    Review of Systems  Constitutional:  Positive for fatigue.  HENT: Negative.    Eyes: Negative.   Respiratory: Negative.    Cardiovascular: Negative.   Gastrointestinal: Negative.   Genitourinary:  Negative for decreased urine volume, difficulty urinating, dyspareunia, dysuria, enuresis, flank pain, frequency, hematuria, pelvic pain and urgency.  Musculoskeletal:  Positive for myalgias.  Skin: Negative.   Neurological:  Positive for headaches.  Psychiatric/Behavioral: Negative.         Objective:   Physical Exam Constitutional:      Comments: In a wheelchair, frail   HENT:     Mouth/Throat:     Pharynx: Oropharynx is clear.  Eyes:     Conjunctiva/sclera: Conjunctivae normal.     Pupils: Pupils are equal, round, and reactive to light.  Cardiovascular:     Rate and Rhythm: Normal rate and regular rhythm.     Pulses: Normal pulses.     Heart sounds: Normal heart sounds.  Pulmonary:     Effort: Pulmonary effort is normal.     Breath sounds: Normal breath sounds.  Abdominal:     General: Abdomen is flat. Bowel sounds are normal. There is no distension.     Palpations: Abdomen is soft. There is no mass.     Tenderness: There is no abdominal tenderness. There is no right CVA tenderness, left CVA tenderness, guarding or rebound.     Hernia: No hernia is present.  Musculoskeletal:        General: No swelling.     Right lower leg: No edema.     Left lower leg: No edema.  Lymphadenopathy:     Cervical: No cervical adenopathy.  Skin:    General: Skin is warm and dry.  Neurological:     Mental Status: She is alert and oriented to  person, place, and time. Mental status is at baseline.  Psychiatric:        Mood and Affect: Mood normal.        Behavior: Behavior normal.        Thought Content: Thought content normal.           Assessment & Plan:  She seems to be doing well overall. Her chronic pain is stable. Her osteoporosis is stable on Prolia . Her migraines and ADHD are stable. We will get labs to check thyroid  levels, lipids, etc. We spent a total of (35   ) minutes reviewing records and discussing these issues.  Garnette Olmsted, MD

## 2023-09-27 NOTE — Addendum Note (Signed)
 Addended by: LADONNA INOCENTE SAILOR on: 09/27/2023 05:02 PM   Modules accepted: Orders

## 2023-09-28 LAB — CBC WITH DIFFERENTIAL/PLATELET
Basophils Absolute: 0 K/uL (ref 0.0–0.1)
Basophils Relative: 0.7 % (ref 0.0–3.0)
Eosinophils Absolute: 0 K/uL (ref 0.0–0.7)
Eosinophils Relative: 0.2 % (ref 0.0–5.0)
HCT: 39.7 % (ref 36.0–46.0)
Hemoglobin: 13.2 g/dL (ref 12.0–15.0)
Lymphocytes Relative: 27.9 % (ref 12.0–46.0)
Lymphs Abs: 1.9 K/uL (ref 0.7–4.0)
MCHC: 33.3 g/dL (ref 30.0–36.0)
MCV: 97.3 fl (ref 78.0–100.0)
Monocytes Absolute: 0.3 K/uL (ref 0.1–1.0)
Monocytes Relative: 3.8 % (ref 3.0–12.0)
Neutro Abs: 4.5 K/uL (ref 1.4–7.7)
Neutrophils Relative %: 67.4 % (ref 43.0–77.0)
Platelets: 442 K/uL — ABNORMAL HIGH (ref 150.0–400.0)
RBC: 4.08 Mil/uL (ref 3.87–5.11)
RDW: 13.3 % (ref 11.5–15.5)
WBC: 6.8 K/uL (ref 4.0–10.5)

## 2023-09-28 LAB — BASIC METABOLIC PANEL WITH GFR
BUN: 11 mg/dL (ref 6–23)
CO2: 25 meq/L (ref 19–32)
Calcium: 9.6 mg/dL (ref 8.4–10.5)
Chloride: 99 meq/L (ref 96–112)
Creatinine, Ser: 0.76 mg/dL (ref 0.40–1.20)
GFR: 74.19 mL/min (ref >60.00)
Glucose, Bld: 109 mg/dL — ABNORMAL HIGH (ref 70–99)
Potassium: 4.5 meq/L (ref 3.5–5.1)
Sodium: 133 meq/L — ABNORMAL LOW (ref 135–145)

## 2023-09-28 LAB — URINALYSIS
Bilirubin Urine: NEGATIVE
Hgb urine dipstick: NEGATIVE
Ketones, ur: NEGATIVE
Leukocytes,Ua: NEGATIVE
Nitrite: NEGATIVE
Specific Gravity, Urine: 1.01 (ref 1.000–1.030)
Total Protein, Urine: NEGATIVE
Urine Glucose: NEGATIVE
Urobilinogen, UA: 0.2 (ref 0.0–1.0)
pH: 8 (ref 5.0–8.0)

## 2023-09-28 LAB — T3, FREE: T3, Free: 2.4 pg/mL (ref 2.3–4.2)

## 2023-09-28 LAB — LIPID PANEL
Cholesterol: 224 mg/dL — ABNORMAL HIGH (ref 0–200)
HDL: 55.4 mg/dL (ref >39.00)
LDL Cholesterol: 128 mg/dL — ABNORMAL HIGH (ref 0–99)
NonHDL: 168.68
Total CHOL/HDL Ratio: 4
Triglycerides: 205 mg/dL — ABNORMAL HIGH (ref 0.0–149.0)
VLDL: 41 mg/dL — ABNORMAL HIGH (ref 0.0–40.0)

## 2023-09-28 LAB — VITAMIN B12: Vitamin B-12: 529 pg/mL (ref 211–911)

## 2023-09-28 LAB — HEMOGLOBIN A1C: Hgb A1c MFr Bld: 5.1 % (ref 4.6–6.5)

## 2023-09-28 LAB — VITAMIN D 25 HYDROXY (VIT D DEFICIENCY, FRACTURES): VITD: 83.64 ng/mL (ref 30.00–100.00)

## 2023-09-28 LAB — HEPATIC FUNCTION PANEL
ALT: 14 U/L (ref 0–35)
AST: 24 U/L (ref 0–37)
Albumin: 4 g/dL (ref 3.5–5.2)
Alkaline Phosphatase: 63 U/L (ref 39–117)
Bilirubin, Direct: 0.1 mg/dL (ref 0.0–0.3)
Total Bilirubin: 0.4 mg/dL (ref 0.2–1.2)
Total Protein: 6.9 g/dL (ref 6.0–8.3)

## 2023-09-28 LAB — TSH: TSH: 1.39 u[IU]/mL (ref 0.35–5.50)

## 2023-09-28 LAB — T4, FREE: Free T4: 0.76 ng/dL (ref 0.60–1.60)

## 2023-09-29 ENCOUNTER — Ambulatory Visit: Payer: Self-pay | Admitting: Family Medicine

## 2023-09-30 ENCOUNTER — Telehealth: Payer: Self-pay | Admitting: *Deleted

## 2023-09-30 LAB — DRUG MONITOR, PANEL 1, W/CONF, URINE
Alphahydroxyalprazolam: NEGATIVE ng/mL (ref <25)
Alphahydroxymidazolam: NEGATIVE ng/mL (ref <50)
Alphahydroxytriazolam: NEGATIVE ng/mL (ref <50)
Aminoclonazepam: NEGATIVE ng/mL (ref <25)
Amphetamines: NEGATIVE ng/mL (ref <500)
Barbiturates: NEGATIVE ng/mL (ref <300)
Benzodiazepines: POSITIVE ng/mL — AB (ref <100)
Cocaine Metabolite: NEGATIVE ng/mL (ref <150)
Codeine: NEGATIVE ng/mL (ref <50)
Creatinine: 18.8 mg/dL — ABNORMAL LOW (ref >=20.0)
Hydrocodone: NEGATIVE ng/mL (ref <50)
Hydromorphone: 63 ng/mL — ABNORMAL HIGH (ref <50)
Hydroxyethylflurazepam: NEGATIVE ng/mL (ref <50)
Lorazepam: 755 ng/mL — ABNORMAL HIGH (ref <50)
Marijuana Metabolite: NEGATIVE ng/mL (ref <20)
Methadone Metabolite: NEGATIVE ng/mL (ref <100)
Morphine: NEGATIVE ng/mL (ref <50)
Nordiazepam: NEGATIVE ng/mL (ref <50)
Norhydrocodone: 362 ng/mL — ABNORMAL HIGH (ref <50)
Noroxycodone: 1950 ng/mL — ABNORMAL HIGH (ref <50)
Opiates: POSITIVE ng/mL — AB (ref <100)
Oxazepam: NEGATIVE ng/mL (ref <50)
Oxidant: NEGATIVE ug/mL (ref <200)
Oxycodone: 50 ng/mL — ABNORMAL HIGH (ref <50)
Oxycodone: POSITIVE ng/mL — AB (ref <100)
Oxymorphone: 962 ng/mL — ABNORMAL HIGH (ref <50)
Phencyclidine: NEGATIVE ng/mL (ref <25)
Specific Gravity: 1.003 (ref >=1.003)
Temazepam: NEGATIVE ng/mL (ref <50)
pH: 7.7 (ref 4.5–9.0)

## 2023-09-30 LAB — DM TEMPLATE

## 2023-09-30 NOTE — Telephone Encounter (Signed)
 Copied from CRM #[id number]. Topic: Clinical - Lab/Test Results >> Sep 30, 2023  3:09 PM Sasha M wrote: Reason for CRM: pt called to request nancy call her back to discuss lab results. Call back number is [phone]

## 2023-10-05 ENCOUNTER — Telehealth: Payer: Self-pay

## 2023-10-05 NOTE — Telephone Encounter (Signed)
Reviewed lab results with pt- voiced understanding

## 2023-10-05 NOTE — Telephone Encounter (Signed)
 Returned pt call voiced understanding of lab results

## 2023-10-05 NOTE — Telephone Encounter (Signed)
 Copied from CRM [id number]. Topic: Clinical - Lab/Test Results >> Oct 04, 2023  4:53 PM Linda Kent wrote: Reason for CRM: Patient is calling in to speak to Allen, regarding her labs relayed results but patient had more questions. Patient is requesting a call after 12 noon.

## 2023-10-12 DIAGNOSIS — M810 Age-related osteoporosis without current pathological fracture: Secondary | ICD-10-CM | POA: Diagnosis not present

## 2023-10-27 ENCOUNTER — Telehealth: Payer: Self-pay | Admitting: *Deleted

## 2023-10-27 NOTE — Telephone Encounter (Signed)
 Copied from CRM #[id number]. Topic: Clinical - Prescription Issue >> Oct 27, 2023 11:32 AM Robinson H wrote: Reason for CRM: Patient calling husband picked up LORazepam  (ATIVAN ) 2 MG tablet on Monday, he's getting forgetful and lost medication and they can't find it. Patient is out of medication and need provider to reach out to approve, patient doesn't need a refill has refills just need approval called in due to being a controlled substance needs provider approval. Patient states she knows she has to pay out of pocket since insurance won't cover it again.  Marcena [phone]

## 2023-10-31 NOTE — Telephone Encounter (Signed)
 Please call her pharmacy to explain this and okay another refill

## 2023-11-01 NOTE — Telephone Encounter (Signed)
 Spoke with pt stated that she already picked up Rx from her pharmacy

## 2023-11-03 ENCOUNTER — Ambulatory Visit: Admitting: Family Medicine

## 2023-11-04 ENCOUNTER — Other Ambulatory Visit: Payer: Self-pay | Admitting: Family Medicine

## 2023-11-04 NOTE — Telephone Encounter (Unsigned)
 Copied from CRM [id number]. Topic: Clinical - Medication Refill >> Nov 04, 2023  4:44 PM Delon DASEN wrote: Medication: methylphenidate  (RITALIN ) 10 MG tablet  Has the patient contacted their pharmacy? No (Agent: If no, request that the patient contact the pharmacy for the refill. If patient does not wish to contact the pharmacy document the reason why and proceed with request.) (Agent: If yes, when and what did the pharmacy advise?)  This is the patient's preferred pharmacy:  [facility] at [address] [place], [place] [address] Phone: [phone]   Is this the correct pharmacy for this prescription? Yes If no, delete pharmacy and type the correct one.   Has the prescription been filled recently? Yes  Is the patient out of the medication? Yes  Has the patient been seen for an appointment in the last year OR does the patient have an upcoming appointment? Yes  Can we respond through MyChart? No  Agent: Please be advised that Rx refills may take up to 3 business days. We ask that you follow-up with your pharmacy.

## 2023-11-11 ENCOUNTER — Telehealth: Payer: Self-pay

## 2023-11-11 NOTE — Telephone Encounter (Signed)
 Copied from CRM [id number]. Topic: Clinical - Medication Question >> Nov 11, 2023 11:21 AM Viola F wrote: *3rd Request* Patient called to follow up on refill request from 11/04/23 for the methylphenidate  (RITALIN ) 10 MG tablet - she [id number] [place],  - [address]  She would like a call today with an update

## 2023-11-14 NOTE — Telephone Encounter (Signed)
 She already has refills until Nov 26

## 2023-11-16 NOTE — Telephone Encounter (Signed)
 Pt spouse notified to pick up Rx from pharmacy

## 2023-11-20 ENCOUNTER — Other Ambulatory Visit: Payer: Self-pay | Admitting: Family Medicine

## 2023-11-21 NOTE — Telephone Encounter (Signed)
 Pt LOV was on 09/27/23 Last refill was done on 05/10/23 Please advise

## 2023-11-23 ENCOUNTER — Telehealth: Payer: Self-pay

## 2023-11-23 MED ORDER — METHYLPHENIDATE HCL 10 MG PO TABS: 10.0000 mg | ORAL_TABLET | Freq: Two times a day (BID) | ORAL | 0 refills | Status: DC

## 2023-11-23 NOTE — Telephone Encounter (Signed)
 Copied from CRM [id number]. Topic: Clinical - Prescription Issue >> Nov 14, 2023  3:15 PM Victoria A wrote: Reason for CRM: Patient called said that [facility] can not get methylphenidate  (RITALIN ) 10 MG tablet; Message from Dr.Fry states she has refills until 12/13/23. But methylphenidate  (RITALIN ) 10 MG tablet has zero refills left; [facility] [address] Please contact patient she is very worried. >> Nov 17, 2023 11:40 AM CMA Inocente POUR wrote: Spoke with pt Spouse verbalized understanding that pt has Rx in her pharmacy.

## 2023-11-23 NOTE — Telephone Encounter (Signed)
[facility]

## 2023-11-23 NOTE — Addendum Note (Signed)
 Addended by: JOHNNY SENIOR A on: 11/23/2023 03:32 PM   Modules accepted: Orders

## 2023-12-21 ENCOUNTER — Telehealth: Payer: Self-pay | Admitting: Family Medicine

## 2023-12-21 ENCOUNTER — Telehealth: Payer: Self-pay | Admitting: *Deleted

## 2023-12-21 NOTE — Telephone Encounter (Unsigned)
 Copied from CRM [id number]. Topic: Clinical - Medication Refill >> Dec 21, 2023  1:43 PM Mesmerise C wrote: Medication:  Oxycodone  HCl 20 MG TABS  HYDROcodone -acetaminophen  (NORCO) 10-325 MG tablet LORazepam  (ATIVAN ) 2 MG tablet  Has the patient contacted their pharmacy? Yes (Agent: If no, request that the patient contact the pharmacy for the refill. If patient does not wish to contact the pharmacy document the reason why and proceed with request.) (Agent: If yes, when and what did the pharmacy advise?)  This is the patient's preferred pharmacy:  [facility] [place], [place] - [address] [facility] [address] [place] [address] Phone: [phone] Fax: [phone]   Is this the correct pharmacy for this prescription? Yes If no, delete pharmacy and type the correct one.   Has the prescription been filled recently? No  Is the patient out of the medication? No  Has the patient been seen for an appointment in the last year OR does the patient have an upcoming appointment? Yes  Can we respond through MyChart? No  Agent: Please be advised that Rx refills may take up to 3 business days. We ask that you follow-up with your pharmacy. Can be reached at [phone]

## 2023-12-21 NOTE — Telephone Encounter (Signed)
 Spoke with pt appointment for PMV scheduled for 12/23/23

## 2023-12-21 NOTE — Telephone Encounter (Unsigned)
 Copied from CRM [id number]. Topic: Clinical - Medication Refill >> Dec 21, 2023  1:43 PM Mesmerise C wrote: Medication:  Oxycodone  HCl 20 MG TABS  HYDROcodone -acetaminophen  (NORCO) 10-325 MG tablet LORazepam  (ATIVAN ) 2 MG tablet  Has the patient contacted their pharmacy? Yes (Agent: If no, request that the patient contact the pharmacy for the refill. If patient does not wish to contact the pharmacy document the reason why and proceed with request.) (Agent: If yes, when and what did the pharmacy advise?)  This is the patient's preferred pharmacy:  [facility] [place], [place] - [address] [facility] [address] [place] [address] Phone: [phone] Fax: [phone]   Is this the correct pharmacy for this prescription? Yes If no, delete pharmacy and type the correct one.   Has the prescription been filled recently? No  Is the patient out of the medication? No  Has the patient been seen for an appointment in the last year OR does the patient have an upcoming appointment? Yes  Can we respond through MyChart? No  Agent: Please be advised that Rx refills may take up to 3 business days. We ask that you follow-up with your pharmacy. Can be reached at [phone] >> Dec 21, 2023  1:58 PM Mesmerise C wrote: Patient is a lot of pain and needing her medications refilled states she still has some on hand but is in pain, offered to get to [facility] but declined stated if Dr. Johnny had questions can reach out to her at [phone]

## 2023-12-23 ENCOUNTER — Ambulatory Visit: Admitting: Family Medicine

## 2023-12-26 ENCOUNTER — Telehealth: Payer: Self-pay

## 2023-12-26 ENCOUNTER — Ambulatory Visit: Admitting: Family Medicine

## 2023-12-26 ENCOUNTER — Encounter: Payer: Self-pay | Admitting: Family Medicine

## 2023-12-26 VITALS — BP 76/56 | HR 93 | Temp 97.7°F | Ht 64.0 in | Wt 122.2 lb

## 2023-12-26 DIAGNOSIS — G8929 Other chronic pain: Secondary | ICD-10-CM

## 2023-12-26 DIAGNOSIS — M7918 Myalgia, other site: Secondary | ICD-10-CM

## 2023-12-26 MED ORDER — HYDROCODONE-ACETAMINOPHEN 10-325 MG PO TABS: 1.0000 | ORAL_TABLET | Freq: Four times a day (QID) | ORAL | 0 refills | Status: DC | PRN

## 2023-12-26 MED ORDER — METHYLPHENIDATE HCL 10 MG PO TABS: 10.0000 mg | ORAL_TABLET | Freq: Two times a day (BID) | ORAL | 0 refills | Status: DC

## 2023-12-26 MED ORDER — METRONIDAZOLE 0.75 % EX CREA: 1.0000 | TOPICAL_CREAM | Freq: Every day | CUTANEOUS | 3 refills | Status: AC

## 2023-12-26 MED ORDER — OXYCODONE HCL 20 MG PO TABS: 20.0000 mg | ORAL_TABLET | ORAL | 0 refills | Status: DC | PRN

## 2023-12-26 MED ORDER — DIPHENOXYLATE-ATROPINE 2.5-0.025 MG PO TABS: 2.0000 | ORAL_TABLET | Freq: Four times a day (QID) | ORAL | 5 refills | Status: DC | PRN

## 2023-12-26 NOTE — Telephone Encounter (Signed)
 Copied from CRM [id number]. Topic: Clinical - Medication Question >> Dec 26, 2023 10:32 AM Berneda FALCON wrote: Reason for CRM: Nathanel from [facility] states that she received a number of prescriptions for this patient either stating it was okay to fill today or to fill on future dates, except one which was showing fill date of 02/2023.  This one says to fill on 02/26/2023 and she wanted to be sure that this one meant to say 2026, not 2025 HYDROcodone -acetaminophen  (NORCO) 10-325 MG tablet.  [facility] [place], [place] - [address] [facility] [address] [place] [address] Phone: [phone] Fax: [phone] Hours: Not open 24 hours  (Press option 3 to reach [place]) she will be there until 1 or Janie after 1PM.

## 2023-12-26 NOTE — Progress Notes (Signed)
   Subjective:    Patient ID: Linda Kent, female    DOB: May 07, 1945, 78 y.o.   MRN: [id number]  HPI Here for pain management. She is doing well.    Review of Systems  Constitutional: Negative.   Musculoskeletal:  Positive for myalgias.       Objective:   Physical Exam Constitutional:      Comments: In a wheelchair   Neurological:     Mental Status: She is alert.           Assessment & Plan:  Pain management. Indication for chronic opioid: myofascial pain Medication and dose: Norco 10-325 and Oxycodone  20 mg  # pills per month: 120 and 180 Last UDS date: 12-26-23 Opioid Treatment Agreement signed (Y/N): 04-06-17 Opioid Treatment Agreement last reviewed with patient:  12-26-23 [facility] reviewed this encounter (include red flags): Yes Meds were refilled.  Garnette Olmsted, MD

## 2023-12-28 LAB — DRUG MONITORING, PANEL 8 WITH CONFIRMATION, URINE
6 Acetylmorphine: NEGATIVE ng/mL (ref <10)
Alcohol Metabolites: NEGATIVE ng/mL (ref <500)
Alphahydroxyalprazolam: NEGATIVE ng/mL (ref <25)
Alphahydroxymidazolam: NEGATIVE ng/mL (ref <50)
Alphahydroxytriazolam: NEGATIVE ng/mL (ref <50)
Aminoclonazepam: NEGATIVE ng/mL (ref <25)
Amphetamines: NEGATIVE ng/mL (ref <500)
Benzodiazepines: POSITIVE ng/mL — AB (ref <100)
Buprenorphine, Urine: NEGATIVE ng/mL (ref <5)
Cocaine Metabolite: NEGATIVE ng/mL (ref <150)
Codeine: NEGATIVE ng/mL (ref <50)
Creatinine: 189.3 mg/dL (ref >=20.0)
Hydrocodone: 1392 ng/mL — ABNORMAL HIGH (ref <50)
Hydromorphone: 398 ng/mL — ABNORMAL HIGH (ref <50)
Hydroxyethylflurazepam: NEGATIVE ng/mL (ref <50)
Lorazepam: >8000 ng/mL — ABNORMAL HIGH (ref <50)
MDMA: NEGATIVE ng/mL (ref <500)
Marijuana Metabolite: NEGATIVE ng/mL (ref <20)
Morphine: NEGATIVE ng/mL (ref <50)
Nordiazepam: NEGATIVE ng/mL (ref <50)
Norhydrocodone: 7237 ng/mL — ABNORMAL HIGH (ref <50)
Noroxycodone: >10000 ng/mL — ABNORMAL HIGH (ref <50)
Opiates: POSITIVE ng/mL — AB (ref <100)
Oxazepam: NEGATIVE ng/mL (ref <50)
Oxidant: NEGATIVE ug/mL (ref <200)
Oxycodone: 7927 ng/mL — ABNORMAL HIGH (ref <50)
Oxycodone: POSITIVE ng/mL — AB (ref <100)
Oxymorphone: 5043 ng/mL — ABNORMAL HIGH (ref <50)
Temazepam: NEGATIVE ng/mL (ref <50)
pH: 5.4 (ref 4.5–9.0)

## 2023-12-28 LAB — DM TEMPLATE

## 2023-12-31 ENCOUNTER — Emergency Department (HOSPITAL_COMMUNITY)
Admission: EM | Admit: 2023-12-31 | Discharge: 2023-12-31 | Disposition: A | Discharge status: Home / Self Care | Attending: Emergency Medicine | Admitting: Emergency Medicine

## 2023-12-31 ENCOUNTER — Emergency Department (HOSPITAL_COMMUNITY)

## 2023-12-31 DIAGNOSIS — R9431 Abnormal electrocardiogram [ECG] [EKG]: Secondary | ICD-10-CM | POA: Diagnosis not present

## 2023-12-31 DIAGNOSIS — M542 Cervicalgia: Secondary | ICD-10-CM | POA: Diagnosis not present

## 2023-12-31 DIAGNOSIS — W19XXXA Unspecified fall, initial encounter: Secondary | ICD-10-CM | POA: Diagnosis not present

## 2023-12-31 DIAGNOSIS — I6782 Cerebral ischemia: Secondary | ICD-10-CM | POA: Insufficient documentation

## 2023-12-31 DIAGNOSIS — T07XXXA Unspecified multiple injuries, initial encounter: Secondary | ICD-10-CM | POA: Diagnosis not present

## 2023-12-31 DIAGNOSIS — W010XXA Fall on same level from slipping, tripping and stumbling without subsequent striking against object, initial encounter: Secondary | ICD-10-CM | POA: Diagnosis not present

## 2023-12-31 DIAGNOSIS — D72829 Elevated white blood cell count, unspecified: Secondary | ICD-10-CM | POA: Insufficient documentation

## 2023-12-31 DIAGNOSIS — M25551 Pain in right hip: Secondary | ICD-10-CM | POA: Diagnosis present

## 2023-12-31 DIAGNOSIS — R29898 Other symptoms and signs involving the musculoskeletal system: Secondary | ICD-10-CM | POA: Diagnosis not present

## 2023-12-31 DIAGNOSIS — R519 Headache, unspecified: Secondary | ICD-10-CM | POA: Diagnosis not present

## 2023-12-31 DIAGNOSIS — S79919A Unspecified injury of unspecified hip, initial encounter: Secondary | ICD-10-CM | POA: Diagnosis not present

## 2023-12-31 DIAGNOSIS — Z7982 Long term (current) use of aspirin: Secondary | ICD-10-CM | POA: Diagnosis not present

## 2023-12-31 LAB — TYPE AND SCREEN
ABO/RH(D): O POS
Antibody Screen: NEGATIVE

## 2023-12-31 LAB — BASIC METABOLIC PANEL WITH GFR
Anion gap: 11 (ref 5–15)
BUN: 13 mg/dL (ref 8–23)
CO2: 24 mmol/L (ref 22–32)
Calcium: 9.4 mg/dL (ref 8.9–10.3)
Chloride: 106 mmol/L (ref 98–111)
Creatinine, Ser: 0.72 mg/dL (ref 0.44–1.00)
GFR, Estimated: >60 mL/min (ref >60)
Glucose, Bld: 125 mg/dL — ABNORMAL HIGH (ref 70–99)
Potassium: 3.9 mmol/L (ref 3.5–5.1)
Sodium: 140 mmol/L (ref 135–145)

## 2023-12-31 LAB — CBC WITH DIFFERENTIAL/PLATELET
Abs Immature Granulocytes: 0.05 K/uL (ref 0.00–0.07)
Basophils Absolute: 0 K/uL (ref 0.0–0.1)
Basophils Relative: 0 %
Eosinophils Absolute: 0 K/uL (ref 0.0–0.5)
Eosinophils Relative: 0 %
HCT: 41.8 % (ref 36.0–46.0)
Hemoglobin: 13.5 g/dL (ref 12.0–15.0)
Immature Granulocytes: 0 %
Lymphocytes Relative: 16 %
Lymphs Abs: 1.8 K/uL (ref 0.7–4.0)
MCH: 32.4 pg (ref 26.0–34.0)
MCHC: 32.3 g/dL (ref 30.0–36.0)
MCV: 100.2 fL — ABNORMAL HIGH (ref 80.0–100.0)
Monocytes Absolute: 0.6 K/uL (ref 0.1–1.0)
Monocytes Relative: 5 %
Neutro Abs: 8.9 K/uL — ABNORMAL HIGH (ref 1.7–7.7)
Neutrophils Relative %: 79 %
Platelets: 243 K/uL (ref 150–400)
RBC: 4.17 MIL/uL (ref 3.87–5.11)
RDW: 13.7 % (ref 11.5–15.5)
WBC: 11.4 K/uL — ABNORMAL HIGH (ref 4.0–10.5)
nRBC: 0 % (ref 0.0–0.2)

## 2023-12-31 LAB — PROTIME-INR
INR: 1 (ref 0.8–1.2)
Prothrombin Time: 13.2 s (ref 11.4–15.2)

## 2023-12-31 MED ORDER — FENTANYL CITRATE (PF) 50 MCG/ML IJ SOSY
12.5000 ug | PREFILLED_SYRINGE | INTRAMUSCULAR | Status: DC | PRN
  Administered 2023-12-31: 12.5 ug via INTRAVENOUS
  Filled 2023-12-31: qty 1

## 2023-12-31 MED ORDER — FENTANYL CITRATE (PF) 50 MCG/ML IJ SOSY
12.5000 ug | PREFILLED_SYRINGE | Freq: Once | INTRAMUSCULAR | Status: AC
  Administered 2023-12-31: 12.5 ug via INTRAVENOUS
  Filled 2023-12-31: qty 1

## 2023-12-31 MED ORDER — ONDANSETRON HCL 4 MG/2ML IJ SOLN
4.0000 mg | Freq: Four times a day (QID) | INTRAMUSCULAR | Status: DC | PRN
  Administered 2023-12-31: 4 mg via INTRAVENOUS
  Filled 2023-12-31: qty 2

## 2023-12-31 NOTE — ED Notes (Signed)
 3 LPM of O2 administered via [place]

## 2023-12-31 NOTE — Discharge Instructions (Signed)
 As discussed, your evaluation today has been largely reassuring.  But, it is important that you monitor your condition carefully, and do not hesitate to return to the ED if you develop new, or concerning changes in your condition. ? ?Otherwise, please follow-up with your physician for appropriate ongoing care. ? ?

## 2023-12-31 NOTE — ED Triage Notes (Signed)
 Patient states she had a fall this am and is having pain in her right hip. Rates pain 5/10. Patient was ambulating to the bathroom and slipped up and fell. Denies any dizziness, passing out, or syncopal episode. Patient states she crawled the bed to try to get up but could not get herself up. Patient states her legs are weak.

## 2023-12-31 NOTE — ED Triage Notes (Signed)
 Patient today reporting fall at 7am this morning. Lives home with husband. Reports that son was able to get her up but then she feel again. Reports right hip pain radiating down to leg.

## 2023-12-31 NOTE — ED Provider Notes (Signed)
 [place] [facility] AT [facility] Provider Note   CSN: [id number] Arrival date & time: 12/31/23  1252     Patient presents with: Fall and Hip Pain   Linda Kent is a 78 y.o. female.   HPI Patient states she had a fall this am and is having pain in her right hip. Rates pain 5/10. Patient was ambulating to the bathroom and slipped up and fell. Denies any dizziness, passing out, or syncopal episode. Patient states she crawled the bed to try to get up but could not get herself up. Patient states her legs are weak.   Patient notes that she did hit her head, shoulders, had pain in her head, neck, though these are improved compared to right after the event.     Prior to Admission medications  Medication Sig Start Date End Date Taking? Authorizing Provider  Ascorbic Acid (VITAMIN C) 1000 MG tablet Take 1,000 mg by mouth 2 (two) times daily. Pt taking 5,000 mg    [provider]  aspirin 81 MG EC tablet Take 81 mg by mouth daily.    [provider]  Calcium  1500 MG tablet Take 1,500 mg by mouth daily. With Vitamin D     [provider]  denosumab  (PROLIA ) 60 MG/ML SOLN injection Inject 60 mg into the skin every 6 (six) months. Administer in upper arm, thigh, or abdomen    [provider]  diphenhydrAMINE (BENADRYL) 25 mg capsule Take 25 mg by mouth every 6 (six) hours as needed.    [provider]  diphenoxylate -atropine  (LOMOTIL ) 2.5-0.025 MG tablet Take 2 tablets by mouth 4 (four) times daily as needed for diarrhea or loose stools. 12/26/23   Johnny Garnette LABOR, MD  fluconazole  (DIFLUCAN ) 150 MG tablet Take 1 tablet (150 mg total) by mouth daily as needed (yeast infections). 06/09/23   Johnny Garnette LABOR, MD  halobetasol  (ULTRAVATE ) 0.05 % cream Apply topically 2 (two) times daily. 09/27/23   Johnny Garnette LABOR, MD  HYDROcodone -acetaminophen  (NORCO) 10-325 MG tablet Take 1 tablet by mouth every 6 (six) hours as needed for moderate pain  (pain score 4-6). 12/26/23   Johnny Garnette LABOR, MD  HYDROcodone -acetaminophen  (NORCO) 10-325 MG tablet Take 1 tablet by mouth every 6 (six) hours as needed for moderate pain (pain score 4-6). 12/26/23   Johnny Garnette LABOR, MD  HYDROcodone -acetaminophen  (NORCO) 10-325 MG tablet Take 1 tablet by mouth every 6 (six) hours as needed for moderate pain (pain score 4-6). 12/26/23   Johnny Garnette LABOR, MD  levothyroxine (SYNTHROID) 75 MCG tablet Take 75 mcg by mouth daily. Brand name only    [provider]  LORazepam  (ATIVAN ) 2 MG tablet TAKE ONE TABLET BY MOUTH THREE TIMES DAILY 11/21/23   Johnny Garnette LABOR, MD  Magnesium 100 MG TABS Take 1 tablet by mouth daily at 6 (six) AM. Taking 400 mg daily    [provider]  meclizine  (ANTIVERT ) 25 MG tablet Take 1 tablet (25 mg total) by mouth every 4 (four) hours as needed for dizziness. 09/27/23   Johnny Garnette LABOR, MD  Melatonin 3 MG CAPS Take 9 mg by mouth at bedtime.    [provider]  methylphenidate  (RITALIN ) 10 MG tablet Take 1 tablet (10 mg total) by mouth in the morning and at bedtime. 12/26/23   Johnny Garnette LABOR, MD  methylphenidate  (RITALIN ) 10 MG tablet Take 1 tablet (10 mg total) by mouth in the morning and at bedtime. 12/26/23   Johnny Garnette  A, MD  methylphenidate  (RITALIN ) 10 MG tablet Take 1 tablet (10 mg total) by mouth in the morning and at bedtime. 12/26/23   Johnny Garnette LABOR, MD  metroNIDAZOLE  (METROCREAM ) 0.75 % cream Apply 1 Application topically daily. 12/26/23   Johnny Garnette LABOR, MD  Multiple Vitamin (MULTIVITAMIN) tablet Take 1 tablet by mouth daily. Centrum 50 +    [provider]  NONFORMULARY OR COMPOUNDED ITEM APPLY A DIME SIZED AMOUNT TO AFFECTED AREA 4 TIMES DAILY AS NEEDED. 10/09/21   Johnny Garnette LABOR, MD  Oxycodone  HCl 20 MG TABS Take 1 tablet (20 mg total) by mouth every 4 (four) hours as needed (pain). 12/26/23   Johnny Garnette LABOR, MD  Oxycodone  HCl 20 MG TABS Take 1 tablet (20 mg total) by mouth every 4 (four) hours as needed  (pain). 12/26/23   Johnny Garnette LABOR, MD  Oxycodone  HCl 20 MG TABS Take 1 tablet (20 mg total) by mouth every 4 (four) hours as needed (pain). 12/26/23   Johnny Garnette LABOR, MD  perphenazine  (TRILAFON ) 4 MG tablet TAKE 1 TABLET BY MOUTH EVERY 4 HOURS AS NEEDED FOR MIGRAINES 09/17/21   Theophilus Andrews, Tully GRADE, MD  Potassium Gluconate 550 MG TABS Take 1 tablet by mouth daily.    [provider]  Probiotic Product (ALIGN PO) Take 1 capsule by mouth daily.     [provider]  rizatriptan  (MAXALT ) 10 MG tablet Take 1 tablet (10 mg total) by mouth as needed for migraine. May repeat in 2 hours if needed 09/27/23   Johnny Garnette LABOR, MD  topiramate  (TOPAMAX ) 50 MG tablet Take 3 tablets (150 mg total) by mouth daily. 09/27/23   Johnny Garnette LABOR, MD  zaleplon  (SONATA ) 10 MG capsule TAKE ONE CAPSULE BY MOUTH AT BEDTIME AS NEEDED FOR SLEEP 07/28/23   Johnny Garnette LABOR, MD    Allergies: Ezetimibe , Atorvastatin  calcium  [atorvastatin ], Crestor  [rosuvastatin  calcium ], and Cephalexin    Review of Systems  Updated Vital Signs BP 104/60   Pulse 85   Temp 98.1 F (36.7 C) (Oral)   Resp 13   SpO2 100%   Physical Exam Vitals and nursing note reviewed.  Constitutional:      General: She is not in acute distress.    Appearance: She is well-developed.  HENT:     Head: Normocephalic and atraumatic.  Eyes:     Conjunctiva/sclera: Conjunctivae normal.  Cardiovascular:     Rate and Rhythm: Normal rate and regular rhythm.  Pulmonary:     Effort: Pulmonary effort is normal. No respiratory distress.     Breath sounds: Normal breath sounds. No stridor.  Abdominal:     General: There is no distension.  Musculoskeletal:     Cervical back: Neck supple. No spinous process tenderness or muscular tenderness.     Comments: Patient can flex each hip, though she does so with pain, primarily about the right posterior and lateral hip.  Tender to palpation as area. Knees unremarkable, ankles unremarkable.  Skin:     General: Skin is warm and dry.  Neurological:     Mental Status: She is alert and oriented to person, place, and time.     Cranial Nerves: No cranial nerve deficit.  Psychiatric:        Mood and Affect: Mood normal.     (all labs ordered are listed, but only abnormal results are displayed) Labs Reviewed  BASIC METABOLIC PANEL WITH GFR - Abnormal; Notable for the following components:  Result Value   Glucose, Bld 125 (*)    All other components within normal limits  CBC WITH DIFFERENTIAL/PLATELET - Abnormal; Notable for the following components:   WBC 11.4 (*)    MCV 100.2 (*)    Neutro Abs 8.9 (*)    All other components within normal limits  PROTIME-INR  TYPE AND SCREEN    EKG: EKG Interpretation Date/Time:  Saturday December 31 2023 15:19:04 EST Ventricular Rate:  90 PR Interval:  134 QRS Duration:  73 QT Interval:  360 QTC Calculation: 441 R Axis:   100  Text Interpretation: Sinus rhythm Right axis deviation Confirmed by Garrick Charleston [phone]) on 12/31/2023 3:28:14 PM  Radiology: CT PELVIS WO CONTRAST Result Date: 12/31/2023 EXAM: CT Pelvis, Without IV Contrast 12/31/2023 03:59:36 PM TECHNIQUE: Axial images were acquired through the pelvis without IV contrast. Reformatted images were reviewed. Automated exposure control, iterative reconstruction, and/or weight based adjustment of the mA/kV was utilized to reduce the radiation dose to as low as reasonably achievable. COMPARISON: X-ray dated 12/31/2023. CLINICAL HISTORY: Hip trauma, fracture suspected, xray done. FINDINGS: BONES: There is a nondisplaced healed fracture of the proximal coccyx. There is osteopenia. No other acute fractures are seen in the visualized portions of the pelvis. JOINTS: No dislocation. The joint spaces are normal. SOFT TISSUES: The soft tissues are unremarkable. INTRAPELVIC CONTENTS: There is a 3.6 cm left adnexal cyst. Uterus is surgically absent. Limited images of the intrapelvic contents  demonstrate no other acute abnormality. IMPRESSION: 1. No acute osseous abnormality related to the hip trauma. 2. Nondisplaced healed fracture of the proximal coccyx. 3. Osteopenia. 4. 3.6 cm left simple-appearing ovarian cyst in a postmenopausal patient. Recommend follow-up pelvic ultrasound in 6-12 months. Electronically signed by: Greig Pique MD 12/31/2023 04:15 PM EST RP Workstation: [id number]   CT CERVICAL SPINE WO CONTRAST Result Date: 12/31/2023 EXAM: CT CERVICAL SPINE WITHOUT CONTRAST 12/31/2023 03:05:50 PM TECHNIQUE: CT of the cervical spine was performed without the administration of intravenous contrast. Multiplanar reformatted images are provided for review. Automated exposure control, iterative reconstruction, and/or weight based adjustment of the mA/kV was utilized to reduce the radiation dose to as low as reasonably achievable. COMPARISON: None available. CLINICAL HISTORY: Polytrauma, blunt. Fall this morning. Leg weakness. FINDINGS: BONES AND ALIGNMENT: Trace retrolisthesis of C3 on C4 and trace anterolisthesis of C5 on C6. No acute fracture or suspicious lesion. DEGENERATIVE CHANGES: Mild diffuse disc degeneration. Asymmetrically advanced left facet arthrosis at C4-C5 and C5-C6. Capacious cervical spinal canal. SOFT TISSUES: No prevertebral soft tissue swelling. LUNGS: Biapical pleural parenchymal lung scarring. IMPRESSION: 1. No acute cervical spine fracture or traumatic malalignment. Electronically signed by: Dasie Hamburg MD 12/31/2023 03:32 PM EST RP Workstation: [id number]   CT HEAD WO CONTRAST Result Date: 12/31/2023 EXAM: CT HEAD WITHOUT 12/31/2023 03:05:50 PM TECHNIQUE: CT of the head was performed without the administration of intravenous contrast. Automated exposure control, iterative reconstruction, and/or weight based adjustment of the mA/kV was utilized to reduce the radiation dose to as low as reasonably achievable. COMPARISON: None available. CLINICAL HISTORY: Polytrauma,  blunt. Fall this morning. Leg weakness. FINDINGS: BRAIN AND VENTRICLES: There is no evidence of an acute infarct, intracranial hemorrhage, mass, midline shift, hydrocephalus, or extra-axial fluid collection. There is mild cerebral atrophy. Cerebral white matter hypodensities are nonspecific but compatible with mild chronic small vessel ischemic disease. Calcified atherosclerosis at the skull base. ORBITS: No acute abnormality. SINUSES AND MASTOIDS: No acute abnormality. SOFT TISSUES AND SKULL: No acute skull fracture. No acute soft tissue abnormality.  IMPRESSION: 1. No acute intracranial abnormality. 2. Mild chronic small vessel ischemic disease. Electronically signed by: Dasie Hamburg MD 12/31/2023 03:25 PM EST RP Workstation: [id number]   DG Hip Unilat With Pelvis 2-3 Views Right Result Date: 12/31/2023 CLINICAL DATA:  fall EXAM: DG HIP (WITH OR WITHOUT PELVIS) 2-3V RIGHT COMPARISON:  None Available. FINDINGS: No acute fracture or dislocation. Joint spaces and alignment are maintained. No area of erosion or osseous destruction. Sacrum is obscured by overlapping bowel contents. No unexpected radiopaque foreign body. Soft tissues are unremarkable. IMPRESSION: 1. No acute fracture or dislocation. 2. If there is a persistent clinical concern for nondisplaced hip or pelvic fracture, recommend dedicated pelvic CT or MRI. Electronically Signed   By: Corean Salter M.D.   On: 12/31/2023 15:04     Procedures   Medications Ordered in the ED  ondansetron  (ZOFRAN ) injection 4 mg (4 mg Intravenous Given 12/31/23 1448)  fentaNYL  (SUBLIMAZE ) injection 12.5 mcg (has no administration in time range)                                    Medical Decision Making Elderly female presents after mechanical fall that occurred earlier today.  Patient struck her head, shoulders, hip.  Differential including fracture of any of the above, intracranial hemorrhage, soft tissue injury, initial neuroexam reassuring, vitals  reassuring, low suspicion for CNS disruption, bacteremia, sepsis contributing to this. Pulse ox 98% room air normal cardiac 90 sinus normal  Amount and/or Complexity of Data Reviewed Labs: ordered. Decision-making details documented in ED Course. Radiology: ordered and independent interpretation performed. Decision-making details documented in ED Course. ECG/medicine tests: ordered and independent interpretation performed. Decision-making details documented in ED Course.  Risk Prescription drug management. Decision regarding hospitalization. Diagnosis or treatment significantly limited by social determinants of health.   4:53 PM On repeat exam patient accompanied by her son. After initial x-ray, CT scans were reviewed, with concern for occult fracture patient went back for CT hip. This study was also reassuring, though there is evidence for remote fractures of coccyx, which she is aware of. Patient has remained hemodynamically unremarkable for hours has had no decompensation, labs are essentially noncontributory, mild leukocytosis noted.  CT head, neck, pelvis all without fracture, intracranial hemorrhage, patient discharged in stable condition to follow-up with primary care, return precautions discussed as well.     Final diagnoses:  Fall, initial encounter    ED Discharge Orders     None          Garrick Charleston, MD 12/31/23 [phone]

## 2024-01-25 NOTE — Telephone Encounter (Signed)
 Tried calling to schedule f/u appt for pt. Pt requested to be called today. Spoke with pt's husband Lupita, he informed me she was sick. Pt will call back to schedule f/u with Dr.Patel once she feels better.

## 2024-01-26 ENCOUNTER — Other Ambulatory Visit: Payer: Self-pay

## 2024-01-26 ENCOUNTER — Inpatient Hospital Stay (HOSPITAL_COMMUNITY)
Admission: EM | Admit: 2024-01-26 | Discharge: 2024-02-02 | DRG: 391 | Disposition: A | Discharge status: Skilled Nursing Facility | Attending: Internal Medicine | Admitting: Internal Medicine

## 2024-01-26 ENCOUNTER — Emergency Department (HOSPITAL_COMMUNITY)

## 2024-01-26 DIAGNOSIS — E039 Hypothyroidism, unspecified: Secondary | ICD-10-CM | POA: Diagnosis present

## 2024-01-26 DIAGNOSIS — Z888 Allergy status to other drugs, medicaments and biological substances status: Secondary | ICD-10-CM

## 2024-01-26 DIAGNOSIS — M810 Age-related osteoporosis without current pathological fracture: Secondary | ICD-10-CM | POA: Diagnosis present

## 2024-01-26 DIAGNOSIS — E785 Hyperlipidemia, unspecified: Secondary | ICD-10-CM | POA: Diagnosis present

## 2024-01-26 DIAGNOSIS — F1193 Opioid use, unspecified with withdrawal: Secondary | ICD-10-CM | POA: Insufficient documentation

## 2024-01-26 DIAGNOSIS — Z860100 Personal history of colon polyps, unspecified: Secondary | ICD-10-CM

## 2024-01-26 DIAGNOSIS — F1123 Opioid dependence with withdrawal: Secondary | ICD-10-CM | POA: Diagnosis not present

## 2024-01-26 DIAGNOSIS — F32A Depression, unspecified: Secondary | ICD-10-CM | POA: Diagnosis present

## 2024-01-26 DIAGNOSIS — E876 Hypokalemia: Secondary | ICD-10-CM | POA: Diagnosis not present

## 2024-01-26 DIAGNOSIS — L719 Rosacea, unspecified: Secondary | ICD-10-CM | POA: Diagnosis present

## 2024-01-26 DIAGNOSIS — K760 Fatty (change of) liver, not elsewhere classified: Secondary | ICD-10-CM | POA: Diagnosis present

## 2024-01-26 DIAGNOSIS — G894 Chronic pain syndrome: Secondary | ICD-10-CM | POA: Diagnosis present

## 2024-01-26 DIAGNOSIS — Z83719 Family history of colon polyps, unspecified: Secondary | ICD-10-CM

## 2024-01-26 DIAGNOSIS — R1110 Vomiting, unspecified: Secondary | ICD-10-CM | POA: Diagnosis present

## 2024-01-26 DIAGNOSIS — Z79890 Hormone replacement therapy (postmenopausal): Secondary | ICD-10-CM

## 2024-01-26 DIAGNOSIS — Z90710 Acquired absence of both cervix and uterus: Secondary | ICD-10-CM

## 2024-01-26 DIAGNOSIS — Z7982 Long term (current) use of aspirin: Secondary | ICD-10-CM

## 2024-01-26 DIAGNOSIS — G43909 Migraine, unspecified, not intractable, without status migrainosus: Secondary | ICD-10-CM | POA: Diagnosis present

## 2024-01-26 DIAGNOSIS — A084 Viral intestinal infection, unspecified: Principal | ICD-10-CM | POA: Diagnosis present

## 2024-01-26 DIAGNOSIS — G9341 Metabolic encephalopathy: Secondary | ICD-10-CM | POA: Diagnosis not present

## 2024-01-26 DIAGNOSIS — F909 Attention-deficit hyperactivity disorder, unspecified type: Secondary | ICD-10-CM | POA: Diagnosis present

## 2024-01-26 DIAGNOSIS — R1111 Vomiting without nausea: Principal | ICD-10-CM

## 2024-01-26 DIAGNOSIS — F988 Other specified behavioral and emotional disorders with onset usually occurring in childhood and adolescence: Secondary | ICD-10-CM | POA: Diagnosis present

## 2024-01-26 DIAGNOSIS — F419 Anxiety disorder, unspecified: Secondary | ICD-10-CM | POA: Diagnosis present

## 2024-01-26 DIAGNOSIS — Z800 Family history of malignant neoplasm of digestive organs: Secondary | ICD-10-CM

## 2024-01-26 DIAGNOSIS — Z79899 Other long term (current) drug therapy: Secondary | ICD-10-CM

## 2024-01-26 LAB — COMPREHENSIVE METABOLIC PANEL WITH GFR
ALT: 13 U/L (ref 0–44)
AST: 34 U/L (ref 15–41)
Albumin: 4.3 g/dL (ref 3.5–5.0)
Alkaline Phosphatase: 222 U/L — ABNORMAL HIGH (ref 38–126)
Anion gap: 16 — ABNORMAL HIGH (ref 5–15)
BUN: 22 mg/dL (ref 8–23)
CO2: 21 mmol/L — ABNORMAL LOW (ref 22–32)
Calcium: 9.2 mg/dL (ref 8.9–10.3)
Chloride: 106 mmol/L (ref 98–111)
Creatinine, Ser: 0.7 mg/dL (ref 0.44–1.00)
GFR, Estimated: >60 mL/min
Glucose, Bld: 116 mg/dL — ABNORMAL HIGH (ref 70–99)
Potassium: 3.6 mmol/L (ref 3.5–5.1)
Sodium: 143 mmol/L (ref 135–145)
Total Bilirubin: 0.5 mg/dL (ref 0.0–1.2)
Total Protein: 7.5 g/dL (ref 6.5–8.1)

## 2024-01-26 LAB — CBC WITH DIFFERENTIAL/PLATELET
Abs Immature Granulocytes: 0.08 K/uL — ABNORMAL HIGH (ref 0.00–0.07)
Basophils Absolute: 0 K/uL (ref 0.0–0.1)
Basophils Relative: 0 %
Eosinophils Absolute: 0 K/uL (ref 0.0–0.5)
Eosinophils Relative: 0 %
HCT: 44.2 % (ref 36.0–46.0)
Hemoglobin: 14.6 g/dL (ref 12.0–15.0)
Immature Granulocytes: 1 %
Lymphocytes Relative: 14 %
Lymphs Abs: 1.3 K/uL (ref 0.7–4.0)
MCH: 32.1 pg (ref 26.0–34.0)
MCHC: 33 g/dL (ref 30.0–36.0)
MCV: 97.1 fL (ref 80.0–100.0)
Monocytes Absolute: 0.5 K/uL (ref 0.1–1.0)
Monocytes Relative: 5 %
Neutro Abs: 7.8 K/uL — ABNORMAL HIGH (ref 1.7–7.7)
Neutrophils Relative %: 80 %
Platelets: 312 K/uL (ref 150–400)
RBC: 4.55 MIL/uL (ref 3.87–5.11)
RDW: 13.7 % (ref 11.5–15.5)
WBC: 9.7 K/uL (ref 4.0–10.5)
nRBC: 0 % (ref 0.0–0.2)

## 2024-01-26 LAB — I-STAT CHEM 8, ED
BUN: 24 mg/dL — ABNORMAL HIGH (ref 8–23)
Calcium, Ion: 1.11 mmol/L — ABNORMAL LOW (ref 1.15–1.40)
Chloride: 109 mmol/L (ref 98–111)
Creatinine, Ser: 0.7 mg/dL (ref 0.44–1.00)
Glucose, Bld: 110 mg/dL — ABNORMAL HIGH (ref 70–99)
HCT: 45 % (ref 36.0–46.0)
Hemoglobin: 15.3 g/dL — ABNORMAL HIGH (ref 12.0–15.0)
Potassium: 3.6 mmol/L (ref 3.5–5.1)
Sodium: 143 mmol/L (ref 135–145)
TCO2: 20 mmol/L — ABNORMAL LOW (ref 22–32)

## 2024-01-26 LAB — I-STAT CG4 LACTIC ACID, ED: Lactic Acid, Venous: 1.9 mmol/L (ref 0.5–1.9)

## 2024-01-26 LAB — LIPASE, BLOOD: Lipase: 32 U/L (ref 11–51)

## 2024-01-26 MED ORDER — TOPIRAMATE 25 MG PO TABS
150.0000 mg | ORAL_TABLET | Freq: Every day | ORAL | Status: DC
  Administered 2024-01-27 – 2024-02-02 (×7): 150 mg via ORAL
  Filled 2024-01-26 (×7): qty 6

## 2024-01-26 MED ORDER — TOPIRAMATE 25 MG PO TABS: 150.0000 mg | ORAL_TABLET | Freq: Every day | ORAL | Status: DC

## 2024-01-26 MED ORDER — LACTATED RINGERS IV SOLN: INTRAVENOUS | Status: DC

## 2024-01-26 MED ORDER — LORAZEPAM 2 MG/ML IJ SOLN
0.5000 mg | Freq: Once | INTRAMUSCULAR | Status: AC
  Administered 2024-01-26: 0.5 mg via INTRAVENOUS
  Filled 2024-01-26: qty 1

## 2024-01-26 MED ORDER — ZOLPIDEM TARTRATE 5 MG PO TABS
5.0000 mg | ORAL_TABLET | Freq: Every evening | ORAL | Status: DC | PRN
  Administered 2024-01-27 – 2024-01-28 (×2): 5 mg via ORAL
  Filled 2024-01-26 (×2): qty 1

## 2024-01-26 MED ORDER — ACETAMINOPHEN 650 MG RE SUPP: 650.0000 mg | Freq: Four times a day (QID) | RECTAL | Status: DC | PRN

## 2024-01-26 MED ORDER — ONDANSETRON HCL 4 MG PO TABS
4.0000 mg | ORAL_TABLET | Freq: Four times a day (QID) | ORAL | Status: DC | PRN
  Administered 2024-01-31 – 2024-02-02 (×4): 4 mg via ORAL
  Filled 2024-01-26 (×4): qty 1

## 2024-01-26 MED ORDER — LACTATED RINGERS IV BOLUS
1000.0000 mL | Freq: Once | INTRAVENOUS | Status: AC
  Administered 2024-01-26: 1000 mL via INTRAVENOUS

## 2024-01-26 MED ORDER — METOCLOPRAMIDE HCL 5 MG/ML IJ SOLN
5.0000 mg | Freq: Once | INTRAMUSCULAR | Status: AC
  Administered 2024-01-26: 5 mg via INTRAVENOUS
  Filled 2024-01-26: qty 2

## 2024-01-26 MED ORDER — IOHEXOL 300 MG/ML  SOLN
100.0000 mL | Freq: Once | INTRAMUSCULAR | Status: AC | PRN
  Administered 2024-01-26: 100 mL via INTRAVENOUS

## 2024-01-26 MED ORDER — SUMATRIPTAN SUCCINATE 50 MG PO TABS
50.0000 mg | ORAL_TABLET | ORAL | Status: DC | PRN
  Administered 2024-01-30: 50 mg via ORAL
  Filled 2024-01-26 (×2): qty 1

## 2024-01-26 MED ORDER — LEVOTHYROXINE SODIUM 75 MCG PO TABS
75.0000 ug | ORAL_TABLET | Freq: Every day | ORAL | Status: DC
  Administered 2024-01-27 – 2024-02-02 (×7): 75 ug via ORAL
  Filled 2024-01-26 (×6): qty 1

## 2024-01-26 MED ORDER — DIPHENHYDRAMINE HCL 50 MG/ML IJ SOLN
12.5000 mg | Freq: Once | INTRAMUSCULAR | Status: AC
  Administered 2024-01-26: 12.5 mg via INTRAVENOUS
  Filled 2024-01-26: qty 1

## 2024-01-26 MED ORDER — OXYCODONE HCL 5 MG PO TABS
20.0000 mg | ORAL_TABLET | ORAL | Status: DC | PRN
  Administered 2024-01-27 – 2024-02-02 (×10): 20 mg via ORAL
  Filled 2024-01-26 (×10): qty 4

## 2024-01-26 MED ORDER — LORAZEPAM 1 MG PO TABS
2.0000 mg | ORAL_TABLET | Freq: Three times a day (TID) | ORAL | Status: DC | PRN
  Filled 2024-01-26: qty 2

## 2024-01-26 MED ORDER — ONDANSETRON HCL 4 MG/2ML IJ SOLN
4.0000 mg | Freq: Four times a day (QID) | INTRAMUSCULAR | Status: DC | PRN
  Administered 2024-01-27 – 2024-01-30 (×6): 4 mg via INTRAVENOUS
  Filled 2024-01-26 (×6): qty 2

## 2024-01-26 MED ORDER — ENOXAPARIN SODIUM 40 MG/0.4ML IJ SOSY
40.0000 mg | PREFILLED_SYRINGE | INTRAMUSCULAR | Status: DC
  Administered 2024-01-27 – 2024-02-02 (×7): 40 mg via SUBCUTANEOUS
  Filled 2024-01-26 (×7): qty 0.4

## 2024-01-26 MED ORDER — METHYLPHENIDATE HCL 5 MG PO TABS
10.0000 mg | ORAL_TABLET | Freq: Two times a day (BID) | ORAL | Status: DC
  Administered 2024-01-27 (×2): 10 mg via ORAL
  Filled 2024-01-26 (×2): qty 2

## 2024-01-26 MED ORDER — ONDANSETRON 8 MG PO TBDP: 8.0000 mg | ORAL_TABLET | Freq: Three times a day (TID) | ORAL | 0 refills | Status: AC | PRN

## 2024-01-26 MED ORDER — MECLIZINE HCL 25 MG PO TABS: 25.0000 mg | ORAL_TABLET | ORAL | Status: DC | PRN

## 2024-01-26 MED ORDER — ASPIRIN 81 MG PO TBEC
81.0000 mg | DELAYED_RELEASE_TABLET | Freq: Every day | ORAL | Status: DC
  Administered 2024-01-27 – 2024-02-02 (×7): 81 mg via ORAL
  Filled 2024-01-26 (×7): qty 1

## 2024-01-26 MED ORDER — ACETAMINOPHEN 325 MG PO TABS: 650.0000 mg | ORAL_TABLET | Freq: Four times a day (QID) | ORAL | Status: DC | PRN

## 2024-01-26 MED ORDER — ALBUTEROL SULFATE (2.5 MG/3ML) 0.083% IN NEBU: 2.5000 mg | INHALATION_SOLUTION | RESPIRATORY_TRACT | Status: DC | PRN

## 2024-01-26 MED ORDER — MECLIZINE HCL 25 MG PO TABS
25.0000 mg | ORAL_TABLET | Freq: Three times a day (TID) | ORAL | Status: DC | PRN
  Administered 2024-01-31: 25 mg via ORAL
  Filled 2024-01-26: qty 1

## 2024-01-26 NOTE — ED Notes (Signed)
 When pt got up into wheelchair to be discharged, she became very nauseated and began vomiting. Pt brought back to treatment room and provider notified.

## 2024-01-26 NOTE — H&P (Signed)
 " History and Physical  Linda Kent FMW:[id number] DOB: Jun 06, 1945 DOA: 01/26/2024  PCP: Johnny Garnette LABOR, MD   Chief Complaint: Vomiting, diarrhea  HPI: Linda Kent is a 79 y.o. female with medical history significant for insomnia, hypothyroidism, IBS, chronic pain on oxycodone  being admitted to the hospital with 4 days of intractable vomiting and diarrhea.  Unclear etiology of her symptoms, she denies any fevers, chills, chest pain, blood in her emesis or stool.  She lives with her husband, and he has not been ill.  She was evaluated in the emergency department this afternoon, was feeling a little bit better, had CT scan of the abdomen pelvis without acute findings, lab work was also not very impressive.  She was getting ready for discharge, when she had sudden onset of recurrent vomiting, so hospitalist observation was requested.  Currently, after receiving more IV fluids, patient is resting comfortably and feeling a little bit better.  Review of Systems: Please see HPI for pertinent positives and negatives. A complete 10 system review of systems are otherwise negative.  Past Medical History:  Diagnosis Date   Acne rosacea    ADD (attention deficit disorder)    Back pain    Chronic insomnia    Colon polyps    Depression    Hyperlipidemia    Hypothyroidism    sees Dr. Gordy Blanch    IBS (irritable bowel syndrome)    Migraines    sees Dr. Layman Meissner    Myofascial pain syndrome    sees Dr. Oneil Philips    Osteoporosis    sees Dr. Gordy Blanch    Post menopausal syndrome    Recurrent cystitis    after intercourse, controlled with macrobid     Routine gynecological examination    sees [facility]    Past Surgical History:  Procedure Laterality Date   ABDOMINAL HYSTERECTOMY     parital for prolapse   APPENDECTOMY     COLONOSCOPY  04/07/2021   per Dr. Stacia, adenomatous polyp, no repeats needed   OVARIAN CYST REMOVAL     excised on left    TONSILLECTOMY     UPPER  GASTROINTESTINAL ENDOSCOPY     Social History:  reports that she has never smoked. She has never used smokeless tobacco. She reports that she does not drink alcohol and does not use drugs.  Allergies  Allergen Reactions   Ezetimibe  Other (See Comments)    Difficulty breathing   Atorvastatin  Calcium  [Atorvastatin ]     Caused chest pain, labored breathing, fatigue    Crestor  [Rosuvastatin  Calcium ] Other (See Comments)    Chest pain, labored breathing   Cephalexin Rash    Family History  Problem Relation Age of Onset   Other Sister        low serotonin levels   Colon polyps Sister    Other Brother        low serotonin levels    Colon cancer Paternal Uncle    Colon cancer Other    Cancer Other 70       puncle decreased from colon cancer    Rectal cancer Neg Hx    Stomach cancer Neg Hx      Prior to Admission medications  Medication Sig Start Date End Date Taking? Authorizing Provider  ondansetron  (ZOFRAN -ODT) 8 MG disintegrating tablet Take 1 tablet (8 mg total) by mouth every 8 (eight) hours as needed for nausea or vomiting. 01/26/24  Yes Dasie Faden, MD  Ascorbic Acid (VITAMIN C)  1000 MG tablet Take 1,000 mg by mouth 2 (two) times daily. Pt taking 5,000 mg    [provider]  aspirin  81 MG EC tablet Take 81 mg by mouth daily.    [provider]  Calcium  1500 MG tablet Take 1,500 mg by mouth daily. With Vitamin D     [provider]  denosumab  (PROLIA ) 60 MG/ML SOLN injection Inject 60 mg into the skin every 6 (six) months. Administer in upper arm, thigh, or abdomen    [provider]  diphenhydrAMINE  (BENADRYL ) 25 mg capsule Take 25 mg by mouth every 6 (six) hours as needed.    [provider]  diphenoxylate -atropine  (LOMOTIL ) 2.5-0.025 MG tablet Take 2 tablets by mouth 4 (four) times daily as needed for diarrhea or loose stools. 12/26/23   Johnny Garnette LABOR, MD  fluconazole  (DIFLUCAN ) 150 MG tablet Take 1 tablet (150 mg total) by mouth  daily as needed (yeast infections). 06/09/23   Johnny Garnette LABOR, MD  halobetasol  (ULTRAVATE ) 0.05 % cream Apply topically 2 (two) times daily. 09/27/23   Johnny Garnette LABOR, MD  HYDROcodone -acetaminophen  (NORCO) 10-325 MG tablet Take 1 tablet by mouth every 6 (six) hours as needed for moderate pain (pain score 4-6). 12/26/23   Johnny Garnette LABOR, MD  HYDROcodone -acetaminophen  (NORCO) 10-325 MG tablet Take 1 tablet by mouth every 6 (six) hours as needed for moderate pain (pain score 4-6). 12/26/23   Johnny Garnette LABOR, MD  HYDROcodone -acetaminophen  (NORCO) 10-325 MG tablet Take 1 tablet by mouth every 6 (six) hours as needed for moderate pain (pain score 4-6). 12/26/23   Johnny Garnette LABOR, MD  levothyroxine  (SYNTHROID ) 75 MCG tablet Take 75 mcg by mouth daily. Brand name only    [provider]  LORazepam  (ATIVAN ) 2 MG tablet TAKE ONE TABLET BY MOUTH THREE TIMES DAILY 11/21/23   Johnny Garnette LABOR, MD  Magnesium 100 MG TABS Take 1 tablet by mouth daily at 6 (six) AM. Taking 400 mg daily    [provider]  meclizine  (ANTIVERT ) 25 MG tablet Take 1 tablet (25 mg total) by mouth every 4 (four) hours as needed for dizziness. 09/27/23   Johnny Garnette LABOR, MD  Melatonin 3 MG CAPS Take 9 mg by mouth at bedtime.    [provider]  methylphenidate  (RITALIN ) 10 MG tablet Take 1 tablet (10 mg total) by mouth in the morning and at bedtime. 12/26/23   Johnny Garnette LABOR, MD  methylphenidate  (RITALIN ) 10 MG tablet Take 1 tablet (10 mg total) by mouth in the morning and at bedtime. 12/26/23   Johnny Garnette LABOR, MD  methylphenidate  (RITALIN ) 10 MG tablet Take 1 tablet (10 mg total) by mouth in the morning and at bedtime. 12/26/23   Johnny Garnette LABOR, MD  metroNIDAZOLE  (METROCREAM ) 0.75 % cream Apply 1 Application topically daily. 12/26/23   Johnny Garnette LABOR, MD  Multiple Vitamin (MULTIVITAMIN) tablet Take 1 tablet by mouth daily. Centrum 50 +    [provider]  NONFORMULARY OR COMPOUNDED ITEM APPLY A DIME SIZED AMOUNT TO  AFFECTED AREA 4 TIMES DAILY AS NEEDED. 10/09/21   Johnny Garnette LABOR, MD  Oxycodone  HCl 20 MG TABS Take 1 tablet (20 mg total) by mouth every 4 (four) hours as needed (pain). 12/26/23   Johnny Garnette LABOR, MD  Oxycodone  HCl 20 MG TABS Take 1 tablet (20 mg total) by mouth every 4 (four) hours as needed (pain). 12/26/23   Johnny Garnette LABOR, MD  Oxycodone  HCl 20 MG TABS Take  1 tablet (20 mg total) by mouth every 4 (four) hours as needed (pain). 12/26/23   Johnny Garnette LABOR, MD  perphenazine  (TRILAFON ) 4 MG tablet TAKE 1 TABLET BY MOUTH EVERY 4 HOURS AS NEEDED FOR MIGRAINES 09/17/21   Theophilus Andrews, Estela Y, MD  Potassium Gluconate 550 MG TABS Take 1 tablet by mouth daily.    [provider]  Probiotic Product (ALIGN PO) Take 1 capsule by mouth daily.     [provider]  rizatriptan  (MAXALT ) 10 MG tablet Take 1 tablet (10 mg total) by mouth as needed for migraine. May repeat in 2 hours if needed 09/27/23   Johnny Garnette LABOR, MD  topiramate  (TOPAMAX ) 50 MG tablet Take 3 tablets (150 mg total) by mouth daily. 09/27/23   Johnny Garnette LABOR, MD  zaleplon  (SONATA ) 10 MG capsule TAKE ONE CAPSULE BY MOUTH AT BEDTIME AS NEEDED FOR SLEEP 07/28/23   Johnny Garnette LABOR, MD    Physical Exam: BP (!) 166/146 (BP Location: Right Arm)   Pulse 100   Temp 98.2 F (36.8 C) (Oral)   Resp 16   SpO2 100%  General:  Alert, oriented, calm, in no acute distress, overall looks dry Eyes: EOMI, clear conjuctivae, white sclerea Neck: supple, no masses, trachea mildline  Cardiovascular: RRR, no murmurs or rubs, no peripheral edema  Respiratory: clear to auscultation bilaterally, no wheezes, no crackles  Abdomen: soft, nontender, nondistended, normal bowel tones heard  Skin: dry, no rashes  Musculoskeletal: no joint effusions, normal range of motion  Psychiatric: appropriate affect, normal speech  Neurologic: extraocular muscles intact, clear speech, moving all extremities with intact sensorium         Labs on Admission:  Basic  Metabolic Panel: Recent Labs  Lab 01/26/24 1608 01/26/24 1629  NA 143 143  K 3.6 3.6  CL 106 109  CO2 21*  --   GLUCOSE 116* 110*  BUN 22 24*  CREATININE 0.70 0.70  CALCIUM  9.2  --    Liver Function Tests: Recent Labs  Lab 01/26/24 1608  AST 34  ALT 13  ALKPHOS 222*  BILITOT 0.5  PROT 7.5  ALBUMIN 4.3   Recent Labs  Lab 01/26/24 1608  LIPASE 32   No results for input(s): AMMONIA in the last 168 hours. CBC: Recent Labs  Lab 01/26/24 1608 01/26/24 1629  WBC 9.7  --   NEUTROABS 7.8*  --   HGB 14.6 15.3*  HCT 44.2 45.0  MCV 97.1  --   PLT 312  --    Cardiac Enzymes: No results for input(s): CKTOTAL, CKMB, CKMBINDEX, TROPONINI in the last 168 hours. BNP (last 3 results) No results for input(s): BNP in the last 8760 hours.  ProBNP (last 3 results) No results for input(s): PROBNP in the last 8760 hours.  CBG: No results for input(s): GLUCAP in the last 168 hours.  Radiological Exams on Admission: CT ABDOMEN PELVIS W CONTRAST Result Date: 01/26/2024 EXAM: CT ABDOMEN AND PELVIS WITH CONTRAST 01/26/2024 05:17:19 PM TECHNIQUE: CT of the abdomen and pelvis was performed with the administration of 100 mL of iohexol  (OMNIPAQUE ) 300 MG/ML solution. Multiplanar reformatted images are provided for review. Automated exposure control, iterative reconstruction, and/or weight-based adjustment of the mA/kV was utilized to reduce the radiation dose to as low as reasonably achievable. COMPARISON: 12/31/2023 CLINICAL HISTORY: Abdominal pain, acute, nonlocalized FINDINGS: LOWER CHEST: Mild elevation of the right hemidiaphragm. Right posterior dependent atelectasis. LIVER: Mild diffuse hepatic steatosis. GALLBLADDER AND BILE DUCTS: Gallbladder is unremarkable. No  radiopaque gallstones. No wall thickening. No biliary ductal dilatation. SPLEEN: No acute abnormality. PANCREAS: Mild atrophy of the pancreatic parenchyma. No mass. ADRENAL GLANDS: No acute abnormality. KIDNEYS,  URETERS AND BLADDER: Normal variant retroaortic left renal vein. No stones in the kidneys or ureters. No hydronephrosis. No perinephric or periureteral stranding. Urinary bladder is unremarkable. GI AND BOWEL: Stomach demonstrates no acute abnormality. There is no bowel obstruction. No right lower quadrant or pericecal inflammatory changes to suggest acute appendicitis. PERITONEUM AND RETROPERITONEUM: No ascites. No free air. No free pelvic fluid. VASCULATURE: Aorta is normal in caliber. Scattered aortoiliac atherosclerosis. LYMPH NODES: No lymphadenopathy. REPRODUCTIVE ORGANS: Hysterectomy. Unchanged 3.8 cm left ovarian cyst. BONES AND SOFT TISSUES: Multilevel thoracic osteophytosis. No acute osseous abnormality. No focal soft tissue abnormality. IMPRESSION: 1. No acute findings in the abdomen or pelvis. 2. Unchanged left ovarian cyst measuring 3.7 cm. 3. Mild diffuse hepatic steatosis. Electronically signed by: Rogelia Myers MD MD 01/26/2024 05:45 PM EST RP Workstation: [id number]  Assessment/Plan  Linda Kent is a 79 y.o. female with medical history significant for insomnia, hypothyroidism, IBS, chronic pain on oxycodone  being admitted to the hospital with 4 days of intractable vomiting and diarrhea.   Intractable vomiting and diarrhea-etiology is unclear, workup is relatively benign, CT without evidence of colitis, obstruction, severe constipation or other acute finding. -Observation admission -Clear liquid diet -Supportive care with pain and nausea medication, as well as IV fluids  Elevated alk phos-unclear why this is elevated in an isolated fashion, no evidence of biliary obstruction.  She does have some mild diffuse hepatic steatosis. -Check GGT to confirm hepatic origin -Trend LFTs  Chronic pain-continue oxycodone  per home dose  Anxiety-Ativan  3 times daily as needed  Hypothyroidism-Synthroid   DVT prophylaxis: Lovenox      Code Status: Full Code  Consults called: None  Admission  status: Observation  Time spent: 48 minutes  Daryl Quiros CHRISTELLA Gail MD [facility] Pager [phone]  If 7PM-7AM, please contact night-coverage [email or web address] Password [email or web address]  01/26/2024, 9:15 PM   "

## 2024-01-26 NOTE — ED Triage Notes (Signed)
 Pt arrives via EMS from home with complaints of Nausea, Vomiting, and Diarrhea that began on Monday. Pt states that her sx have persisted, and she is getting weaker. Pt states I think we got ahold of some bad meat.   Pt alert, no signs of distress during triage.

## 2024-01-26 NOTE — ED Provider Notes (Addendum)
 "  EMERGENCY DEPARTMENT AT [facility] Provider Note   CSN: [id number] Arrival date & time: 01/26/24  1447     Patient presents with: Nausea and Emesis   Linda Kent is a 79 y.o. female.   79 year old female who presents with nausea vomiting and diarrhea x 4 days.  States that she has had epigastric pain which she feels is from her emesis which has been slightly bilious.  Notes that she does have a history of chronic back pain and has been opiates for this.  Denies any prior history of abdominal surgery.  No fever or chills.  Denies any black or bloody stools.  Nothing makes her symptoms better or worse no treatment use prior to arrival.  [facility] and was transported here       Prior to Admission medications  Medication Sig Start Date End Date Taking? Authorizing Provider  Ascorbic Acid (VITAMIN C) 1000 MG tablet Take 1,000 mg by mouth 2 (two) times daily. Pt taking 5,000 mg    [provider]  aspirin  81 MG EC tablet Take 81 mg by mouth daily.    [provider]  Calcium  1500 MG tablet Take 1,500 mg by mouth daily. With Vitamin D     [provider]  denosumab  (PROLIA ) 60 MG/ML SOLN injection Inject 60 mg into the skin every 6 (six) months. Administer in upper arm, thigh, or abdomen    [provider]  diphenhydrAMINE  (BENADRYL ) 25 mg capsule Take 25 mg by mouth every 6 (six) hours as needed.    [provider]  diphenoxylate -atropine  (LOMOTIL ) 2.5-0.025 MG tablet Take 2 tablets by mouth 4 (four) times daily as needed for diarrhea or loose stools. 12/26/23   Johnny Garnette LABOR, MD  fluconazole  (DIFLUCAN ) 150 MG tablet Take 1 tablet (150 mg total) by mouth daily as needed (yeast infections). 06/09/23   Johnny Garnette LABOR, MD  halobetasol  (ULTRAVATE ) 0.05 % cream Apply topically 2 (two) times daily. 09/27/23   Johnny Garnette LABOR, MD  HYDROcodone -acetaminophen  (NORCO) 10-325 MG tablet Take 1 tablet by mouth every 6 (six) hours as needed  for moderate pain (pain score 4-6). 12/26/23   Johnny Garnette LABOR, MD  HYDROcodone -acetaminophen  (NORCO) 10-325 MG tablet Take 1 tablet by mouth every 6 (six) hours as needed for moderate pain (pain score 4-6). 12/26/23   Johnny Garnette LABOR, MD  HYDROcodone -acetaminophen  (NORCO) 10-325 MG tablet Take 1 tablet by mouth every 6 (six) hours as needed for moderate pain (pain score 4-6). 12/26/23   Johnny Garnette LABOR, MD  levothyroxine  (SYNTHROID ) 75 MCG tablet Take 75 mcg by mouth daily. Brand name only    [provider]  LORazepam  (ATIVAN ) 2 MG tablet TAKE ONE TABLET BY MOUTH THREE TIMES DAILY 11/21/23   Johnny Garnette LABOR, MD  Magnesium 100 MG TABS Take 1 tablet by mouth daily at 6 (six) AM. Taking 400 mg daily    [provider]  meclizine  (ANTIVERT ) 25 MG tablet Take 1 tablet (25 mg total) by mouth every 4 (four) hours as needed for dizziness. 09/27/23   Johnny Garnette LABOR, MD  Melatonin 3 MG CAPS Take 9 mg by mouth at bedtime.    [provider]  methylphenidate  (RITALIN ) 10 MG tablet Take 1 tablet (10 mg total) by mouth in the morning and at bedtime. 12/26/23   Johnny Garnette LABOR, MD  methylphenidate  (RITALIN ) 10 MG tablet Take 1 tablet (10 mg total) by mouth in the morning and at bedtime. 12/26/23  Johnny Garnette LABOR, MD  methylphenidate  (RITALIN ) 10 MG tablet Take 1 tablet (10 mg total) by mouth in the morning and at bedtime. 12/26/23   Johnny Garnette LABOR, MD  metroNIDAZOLE  (METROCREAM ) 0.75 % cream Apply 1 Application topically daily. 12/26/23   Johnny Garnette LABOR, MD  Multiple Vitamin (MULTIVITAMIN) tablet Take 1 tablet by mouth daily. Centrum 50 +    [provider]  NONFORMULARY OR COMPOUNDED ITEM APPLY A DIME SIZED AMOUNT TO AFFECTED AREA 4 TIMES DAILY AS NEEDED. 10/09/21   Johnny Garnette LABOR, MD  Oxycodone  HCl 20 MG TABS Take 1 tablet (20 mg total) by mouth every 4 (four) hours as needed (pain). 12/26/23   Johnny Garnette LABOR, MD  Oxycodone  HCl 20 MG TABS Take 1 tablet (20 mg total) by mouth every 4 (four)  hours as needed (pain). 12/26/23   Johnny Garnette LABOR, MD  Oxycodone  HCl 20 MG TABS Take 1 tablet (20 mg total) by mouth every 4 (four) hours as needed (pain). 12/26/23   Johnny Garnette LABOR, MD  perphenazine  (TRILAFON ) 4 MG tablet TAKE 1 TABLET BY MOUTH EVERY 4 HOURS AS NEEDED FOR MIGRAINES 09/17/21   Theophilus Andrews, Estela Y, MD  Potassium Gluconate 550 MG TABS Take 1 tablet by mouth daily.    [provider]  Probiotic Product (ALIGN PO) Take 1 capsule by mouth daily.     [provider]  rizatriptan  (MAXALT ) 10 MG tablet Take 1 tablet (10 mg total) by mouth as needed for migraine. May repeat in 2 hours if needed 09/27/23   Johnny Garnette LABOR, MD  topiramate  (TOPAMAX ) 50 MG tablet Take 3 tablets (150 mg total) by mouth daily. 09/27/23   Johnny Garnette LABOR, MD  zaleplon  (SONATA ) 10 MG capsule TAKE ONE CAPSULE BY MOUTH AT BEDTIME AS NEEDED FOR SLEEP 07/28/23   Johnny Garnette LABOR, MD    Allergies: Ezetimibe , Atorvastatin  calcium  [atorvastatin ], Crestor  [rosuvastatin  calcium ], and Cephalexin    Review of Systems  All other systems reviewed and are negative.   Updated Vital Signs BP (!) 120/58 (BP Location: Right Arm)   Pulse 86   Temp 98 F (36.7 C) (Oral)   Resp 16   SpO2 98%   Physical Exam Vitals and nursing note reviewed.  Constitutional:      General: She is not in acute distress.    Appearance: Normal appearance. She is well-developed. She is not toxic-appearing.  HENT:     Head: Normocephalic and atraumatic.  Eyes:     General: Lids are normal.     Conjunctiva/sclera: Conjunctivae normal.     Pupils: Pupils are equal, round, and reactive to light.  Neck:     Thyroid : No thyroid  mass.     Trachea: No tracheal deviation.  Cardiovascular:     Rate and Rhythm: Normal rate and regular rhythm.     Heart sounds: Normal heart sounds. No murmur heard.    No gallop.  Pulmonary:     Effort: Pulmonary effort is normal. No respiratory distress.     Breath sounds: Normal breath sounds. No  stridor. No decreased breath sounds, wheezing, rhonchi or rales.  Abdominal:     General: There is no distension.     Palpations: Abdomen is soft.     Tenderness: There is abdominal tenderness in the epigastric area. There is no rebound.  Musculoskeletal:        General: No tenderness. Normal range of motion.     Cervical back: Normal range of motion and  neck supple.  Skin:    General: Skin is warm and dry.     Findings: No abrasion or rash.  Neurological:     Mental Status: She is alert and oriented to person, place, and time. Mental status is at baseline.     GCS: GCS eye subscore is 4. GCS verbal subscore is 5. GCS motor subscore is 6.     Cranial Nerves: No cranial nerve deficit.     Sensory: No sensory deficit.     Motor: Motor function is intact.  Psychiatric:        Attention and Perception: Attention normal.        Speech: Speech normal.        Behavior: Behavior normal.     (all labs ordered are listed, but only abnormal results are displayed) Labs Reviewed - No data to display  EKG: EKG Interpretation Date/Time:  Thursday January 26 2024 16:39:34 EST Ventricular Rate:  93 PR Interval:  154 QRS Duration:  74 QT Interval:  356 QTC Calculation: 443 R Axis:   94  Text Interpretation: Sinus rhythm Probable right ventricular hypertrophy No significant change since last tracing Confirmed by Dasie Faden ([id number]) on 01/26/2024 4:56:09 PM  Radiology: No results found.   Procedures   Medications Ordered in the ED  lactated ringers  infusion (has no administration in time range)  lactated ringers  bolus 1,000 mL (has no administration in time range)  metoCLOPramide  (REGLAN ) injection 5 mg (has no administration in time range)  diphenhydrAMINE  (BENADRYL ) injection 12.5 mg (has no administration in time range)                                    Medical Decision Making Amount and/or Complexity of Data Reviewed Labs: ordered. Radiology: ordered. ECG/medicine tests:  ordered.  Risk Prescription drug management.   EKG shows sinus rhythm.  Given IV fluids and antiemetics and feels better.  Able to take oral fluids at this time.  Labs are overall reassuring.  Abdominal CT showed no acute findings.  Patient to be discharged  9:08 PM Patient had been discharged but then began to have repeated emesis..  Will restart IV fluids give patient antiemetics and she will need to be admitted.  Will consult hospitalist team     Final diagnoses:  None    ED Discharge Orders     None          Dasie Faden, MD 01/26/24 2012    Dasie Faden, MD 01/26/24 2109  "

## 2024-01-27 DIAGNOSIS — F419 Anxiety disorder, unspecified: Secondary | ICD-10-CM | POA: Diagnosis present

## 2024-01-27 DIAGNOSIS — Z860100 Personal history of colon polyps, unspecified: Secondary | ICD-10-CM | POA: Diagnosis not present

## 2024-01-27 DIAGNOSIS — F1123 Opioid dependence with withdrawal: Secondary | ICD-10-CM | POA: Diagnosis not present

## 2024-01-27 DIAGNOSIS — G894 Chronic pain syndrome: Secondary | ICD-10-CM | POA: Diagnosis present

## 2024-01-27 DIAGNOSIS — F32A Depression, unspecified: Secondary | ICD-10-CM | POA: Diagnosis present

## 2024-01-27 DIAGNOSIS — M810 Age-related osteoporosis without current pathological fracture: Secondary | ICD-10-CM | POA: Diagnosis present

## 2024-01-27 DIAGNOSIS — Z90710 Acquired absence of both cervix and uterus: Secondary | ICD-10-CM | POA: Diagnosis not present

## 2024-01-27 DIAGNOSIS — E039 Hypothyroidism, unspecified: Secondary | ICD-10-CM | POA: Diagnosis present

## 2024-01-27 DIAGNOSIS — Z7982 Long term (current) use of aspirin: Secondary | ICD-10-CM | POA: Diagnosis not present

## 2024-01-27 DIAGNOSIS — E876 Hypokalemia: Secondary | ICD-10-CM | POA: Diagnosis not present

## 2024-01-27 DIAGNOSIS — F909 Attention-deficit hyperactivity disorder, unspecified type: Secondary | ICD-10-CM | POA: Diagnosis present

## 2024-01-27 DIAGNOSIS — R1110 Vomiting, unspecified: Secondary | ICD-10-CM

## 2024-01-27 DIAGNOSIS — R112 Nausea with vomiting, unspecified: Secondary | ICD-10-CM | POA: Diagnosis present

## 2024-01-27 DIAGNOSIS — K760 Fatty (change of) liver, not elsewhere classified: Secondary | ICD-10-CM | POA: Diagnosis present

## 2024-01-27 DIAGNOSIS — Z79890 Hormone replacement therapy (postmenopausal): Secondary | ICD-10-CM | POA: Diagnosis not present

## 2024-01-27 DIAGNOSIS — Z800 Family history of malignant neoplasm of digestive organs: Secondary | ICD-10-CM | POA: Diagnosis not present

## 2024-01-27 DIAGNOSIS — L719 Rosacea, unspecified: Secondary | ICD-10-CM | POA: Diagnosis present

## 2024-01-27 DIAGNOSIS — F988 Other specified behavioral and emotional disorders with onset usually occurring in childhood and adolescence: Secondary | ICD-10-CM | POA: Diagnosis present

## 2024-01-27 DIAGNOSIS — Z888 Allergy status to other drugs, medicaments and biological substances status: Secondary | ICD-10-CM | POA: Diagnosis not present

## 2024-01-27 DIAGNOSIS — G9341 Metabolic encephalopathy: Secondary | ICD-10-CM | POA: Diagnosis not present

## 2024-01-27 DIAGNOSIS — Z83719 Family history of colon polyps, unspecified: Secondary | ICD-10-CM | POA: Diagnosis not present

## 2024-01-27 DIAGNOSIS — A084 Viral intestinal infection, unspecified: Secondary | ICD-10-CM | POA: Diagnosis present

## 2024-01-27 DIAGNOSIS — Z79899 Other long term (current) drug therapy: Secondary | ICD-10-CM | POA: Diagnosis not present

## 2024-01-27 DIAGNOSIS — E785 Hyperlipidemia, unspecified: Secondary | ICD-10-CM | POA: Diagnosis present

## 2024-01-27 DIAGNOSIS — G43909 Migraine, unspecified, not intractable, without status migrainosus: Secondary | ICD-10-CM | POA: Diagnosis present

## 2024-01-27 LAB — CBC
HCT: 39.3 % (ref 36.0–46.0)
Hemoglobin: 13.1 g/dL (ref 12.0–15.0)
MCH: 32.7 pg (ref 26.0–34.0)
MCHC: 33.3 g/dL (ref 30.0–36.0)
MCV: 98 fL (ref 80.0–100.0)
Platelets: 256 K/uL (ref 150–400)
RBC: 4.01 MIL/uL (ref 3.87–5.11)
RDW: 13.6 % (ref 11.5–15.5)
WBC: 9.2 K/uL (ref 4.0–10.5)
nRBC: 0 % (ref 0.0–0.2)

## 2024-01-27 LAB — URINALYSIS, W/ REFLEX TO CULTURE (INFECTION SUSPECTED)
Glucose, UA: NEGATIVE mg/dL
Ketones, ur: NEGATIVE mg/dL
Nitrite: NEGATIVE
Protein, ur: 30 mg/dL — AB
RBC / HPF: >50 RBC/hpf (ref 0–5)
Specific Gravity, Urine: 1.015 (ref 1.005–1.030)
pH: 7 (ref 5.0–8.0)

## 2024-01-27 LAB — C DIFFICILE QUICK SCREEN W PCR REFLEX
C Diff antigen: NEGATIVE
C Diff interpretation: NOT DETECTED
C Diff toxin: NEGATIVE

## 2024-01-27 LAB — COMPREHENSIVE METABOLIC PANEL WITH GFR
ALT: 13 U/L (ref 0–44)
AST: 35 U/L (ref 15–41)
Albumin: 3.4 g/dL — ABNORMAL LOW (ref 3.5–5.0)
Alkaline Phosphatase: 169 U/L — ABNORMAL HIGH (ref 38–126)
Anion gap: 13 (ref 5–15)
BUN: 18 mg/dL (ref 8–23)
CO2: 21 mmol/L — ABNORMAL LOW (ref 22–32)
Calcium: 8 mg/dL — ABNORMAL LOW (ref 8.9–10.3)
Chloride: 108 mmol/L (ref 98–111)
Creatinine, Ser: 0.53 mg/dL (ref 0.44–1.00)
GFR, Estimated: >60 mL/min
Glucose, Bld: 100 mg/dL — ABNORMAL HIGH (ref 70–99)
Potassium: 3.2 mmol/L — ABNORMAL LOW (ref 3.5–5.1)
Sodium: 142 mmol/L (ref 135–145)
Total Bilirubin: 0.4 mg/dL (ref 0.0–1.2)
Total Protein: 5.7 g/dL — ABNORMAL LOW (ref 6.5–8.1)

## 2024-01-27 LAB — RESP PANEL BY RT-PCR (RSV, FLU A&B, COVID)  RVPGX2
Influenza A by PCR: NEGATIVE
Influenza B by PCR: NEGATIVE
Resp Syncytial Virus by PCR: NEGATIVE
SARS Coronavirus 2 by RT PCR: NEGATIVE

## 2024-01-27 LAB — GAMMA GT: GGT: 23 U/L (ref 7–50)

## 2024-01-27 MED ORDER — LOPERAMIDE HCL 2 MG PO CAPS
2.0000 mg | ORAL_CAPSULE | ORAL | Status: DC | PRN
  Administered 2024-01-27: 2 mg via ORAL
  Filled 2024-01-27: qty 1

## 2024-01-27 MED ORDER — DIPHENOXYLATE-ATROPINE 2.5-0.025 MG PO TABS
1.0000 | ORAL_TABLET | Freq: Four times a day (QID) | ORAL | Status: DC | PRN
  Filled 2024-01-27: qty 1

## 2024-01-27 MED ORDER — POTASSIUM CHLORIDE CRYS ER 20 MEQ PO TBCR
40.0000 meq | EXTENDED_RELEASE_TABLET | Freq: Once | ORAL | Status: AC
  Administered 2024-01-27: 40 meq via ORAL
  Filled 2024-01-27: qty 2

## 2024-01-27 MED ORDER — LORAZEPAM 1 MG PO TABS
2.0000 mg | ORAL_TABLET | Freq: Two times a day (BID) | ORAL | Status: DC | PRN
  Administered 2024-01-30 – 2024-02-01 (×3): 2 mg via ORAL
  Filled 2024-01-27 (×3): qty 2

## 2024-01-27 MED ORDER — HYDROCODONE-ACETAMINOPHEN 10-325 MG PO TABS
1.0000 | ORAL_TABLET | Freq: Four times a day (QID) | ORAL | Status: DC | PRN
  Administered 2024-01-28 – 2024-01-30 (×2): 1 via ORAL
  Filled 2024-01-27 (×2): qty 1

## 2024-01-27 NOTE — Assessment & Plan Note (Signed)
 See above

## 2024-01-27 NOTE — Assessment & Plan Note (Signed)
 Continue home levothyroxine 

## 2024-01-27 NOTE — Plan of Care (Signed)
   Problem: Education: Goal: Knowledge of General Education information will improve Description: Including pain rating scale, medication(s)/side effects and non-pharmacologic comfort measures Outcome: Progressing   Problem: Clinical Measurements: Goal: Ability to maintain clinical measurements within normal limits will improve Outcome: Progressing

## 2024-01-27 NOTE — ED Notes (Signed)
 Attempted to ambulate pt.  Unsteady gait and two person assist necessary.  Pt reports not using assistive devices at baseline.  Provider notified.

## 2024-01-27 NOTE — ED Notes (Signed)
"  Patient provided with breakfast tray   "

## 2024-01-27 NOTE — ED Notes (Signed)
 Assisted patient with bedside commode use.

## 2024-01-27 NOTE — Hospital Course (Signed)
 79 y.o. F with chronic myofascial pain on high dose daily oxycodone  and Norco, ADHD on Ritalin , and hypothyroidism who presented with diarrhea and vomiting for 4 days, now too weak to stand.  In the ER, vital signs normal, electrolytes, renal function, and CBC normal.  CT abdomen and pelvis unremarkable.  Was going to be discharged, but could not keep any food down, and so hospitalist service was asked to observe for fluids.

## 2024-01-27 NOTE — Assessment & Plan Note (Signed)
-   Hold home Ritalin

## 2024-01-27 NOTE — ED Notes (Signed)
 Brief & linens changed, small amount of diarrhea noted. Pt cleaned up and given warm blankets.

## 2024-01-27 NOTE — Assessment & Plan Note (Signed)
 C. difficile negative, flu negative. Diarrhea resolved, tolerating oral diet.

## 2024-01-27 NOTE — Progress Notes (Signed)
" °  Progress Note   Patient: Linda Kent FMW:[id number] DOB: [birth year] DOA: 01/26/2024     0 DOS: the patient was seen and examined on 01/27/2024 at 8:41 AM      Brief hospital course: 79 y.o. F with chronic myofascial pain on high dose daily oxycodone  and Norco, ADHD on Ritalin , and hypothyroidism who presented with diarrhea and vomiting for 4 days, now too weak to stand.  In the ER, vital signs normal, electrolytes, renal function, and CBC normal.  CT abdomen and pelvis unremarkable.  Was going to be discharged, but could not keep any food down, and so hospitalist service was asked to observe for fluids.     Assessment and Plan: * Intractable vomiting Diarrhea - IV fluids - Send C. difficile   Chronic pain syndrome Confirmed with PCP notes and PDMP the patient is actually prescribed Norco 10 mg 4 times daily, oxycodone  20 mg 6 times daily, lorazepam  2 mg 3 times daily, and Ritalin .  Will continue at home doses to avoid precipitation of withdrawal - Continue home oxycodone  and Norco and topiramate   ADHD - Hold home Ritalin   Hypothyroidism - Continue home levothyroxine           Subjective: Patient is continuing to have diarrhea and not able to hold anything down.  No abdominal pain, no fever, no confusion     Physical Exam: BP 134/66 (BP Location: Left Arm)   Pulse 93   Temp 98.1 F (36.7 C) (Oral)   Resp 17   Wt 55.4 kg   SpO2 100%   BMI 20.96 kg/m   Thin elderly female, lying in bed,, oriented, no acute distress Slightly tachycardic, no murmurs, no peripheral edema Respiratory normal, lungs clear without rales or wheezes Abdomen soft, no tenderness palpation or guarding, no ascites or distention Attention normal, affect normal, judgment and insight appear normal    Data Reviewed: Basic metabolic panel shows mild hypokalemia, normal renal function, normal CBC    Family Communication:     Disposition: Status is: Inpatient Aptietn requires ongoing  IV fluids, unable to take PO, IV antiemetics        Author: Lonni SHAUNNA Dalton, MD 01/27/2024 12:41 PM  For on call review [email or web address]    "

## 2024-01-27 NOTE — Progress Notes (Signed)
 PT Cancellation Note  Patient Details Name: MALENA TIMPONE MRN: [id number] DOB: [birth year]   Cancelled Treatment:     Pt in ED at this time. Checked on pt , and pt having reoccurring diarrhea and just had another episode in bed that nursing is having to clean up. At this time, pt is getting admitted. Will hold Pt assessment until pt up on unit.    MILLICENT SALES 01/27/2024, 11:38 AM Sales, PT, MPT Acute Rehabilitation Services Office: [phone] If a weekend: secure chat groups: [facility] PT, [facility] OT, [facility] SLP 01/27/2024

## 2024-01-28 ENCOUNTER — Encounter (HOSPITAL_COMMUNITY): Payer: Self-pay | Admitting: Internal Medicine

## 2024-01-28 DIAGNOSIS — E876 Hypokalemia: Secondary | ICD-10-CM | POA: Insufficient documentation

## 2024-01-28 DIAGNOSIS — R1110 Vomiting, unspecified: Secondary | ICD-10-CM | POA: Diagnosis not present

## 2024-01-28 LAB — COMPREHENSIVE METABOLIC PANEL WITH GFR
ALT: 19 U/L (ref 0–44)
AST: 47 U/L — ABNORMAL HIGH (ref 15–41)
Albumin: 3.4 g/dL — ABNORMAL LOW (ref 3.5–5.0)
Alkaline Phosphatase: 153 U/L — ABNORMAL HIGH (ref 38–126)
Anion gap: 8 (ref 5–15)
BUN: 8 mg/dL (ref 8–23)
CO2: 24 mmol/L (ref 22–32)
Calcium: 8.1 mg/dL — ABNORMAL LOW (ref 8.9–10.3)
Chloride: 108 mmol/L (ref 98–111)
Creatinine, Ser: 0.57 mg/dL (ref 0.44–1.00)
GFR, Estimated: >60 mL/min
Glucose, Bld: 125 mg/dL — ABNORMAL HIGH (ref 70–99)
Potassium: 3.1 mmol/L — ABNORMAL LOW (ref 3.5–5.1)
Sodium: 141 mmol/L (ref 135–145)
Total Bilirubin: 0.4 mg/dL (ref 0.0–1.2)
Total Protein: 5.7 g/dL — ABNORMAL LOW (ref 6.5–8.1)

## 2024-01-28 LAB — CBC
HCT: 36.6 % (ref 36.0–46.0)
Hemoglobin: 12 g/dL (ref 12.0–15.0)
MCH: 31.9 pg (ref 26.0–34.0)
MCHC: 32.8 g/dL (ref 30.0–36.0)
MCV: 97.3 fL (ref 80.0–100.0)
Platelets: 214 K/uL (ref 150–400)
RBC: 3.76 MIL/uL — ABNORMAL LOW (ref 3.87–5.11)
RDW: 13.6 % (ref 11.5–15.5)
WBC: 6.8 K/uL (ref 4.0–10.5)
nRBC: 0 % (ref 0.0–0.2)

## 2024-01-28 LAB — MAGNESIUM: Magnesium: 2.1 mg/dL (ref 1.7–2.4)

## 2024-01-28 MED ORDER — POTASSIUM CHLORIDE IN NACL 20-0.9 MEQ/L-% IV SOLN
INTRAVENOUS | Status: DC
  Filled 2024-01-28: qty 1000

## 2024-01-28 MED ORDER — MELATONIN 5 MG PO TABS: 5.0000 mg | ORAL_TABLET | Freq: Once | ORAL | Status: DC

## 2024-01-28 MED ORDER — POTASSIUM CHLORIDE CRYS ER 20 MEQ PO TBCR
40.0000 meq | EXTENDED_RELEASE_TABLET | Freq: Two times a day (BID) | ORAL | Status: AC
  Administered 2024-01-28 (×2): 40 meq via ORAL
  Filled 2024-01-28 (×2): qty 2

## 2024-01-28 NOTE — Assessment & Plan Note (Signed)
Persistent.  Mag normal. - Continue K supplement 

## 2024-01-28 NOTE — Evaluation (Signed)
 Physical Therapy Evaluation Patient Details Name: Linda Kent MRN: [id number] DOB: 15-Oct-1945 Today's Date: 01/28/2024  History of Present Illness  Linda Kent is a 79 y.o. female presenting to [facility] ED with 4 days of intractable vomiting and diarrhea, CT without evidence of colitis, obstruction, severe constipation or other acute finding.PMH:  insomnia, hypothyroidism, IBS, chronic pain on oxycodone   Clinical Impression  Pt admitted with above diagnosis.  Pt currently with functional limitations due to the deficits listed below (see PT Problem List). Pt will benefit from acute skilled PT to increase their independence and safety with mobility to allow discharge.  Pt reports not being very active at baseline due to back pain however typically able to at least ambulate to/from bathroom without assistive device.  Pt states her husband relies on RW.  Pt will likely need some assist upon d/c. Patient will benefit from continued inpatient follow up therapy, <3 hours/day.          If plan is discharge home, recommend the following: A little help with walking and/or transfers;A little help with bathing/dressing/bathroom;Assistance with cooking/housework;Assist for transportation;Help with stairs or ramp for entrance   Can travel by private vehicle        Equipment Recommendations None recommended by PT  Recommendations for Other Services       Functional Status Assessment Patient has had a recent decline in their functional status and demonstrates the ability to make significant improvements in function in a reasonable and predictable amount of time.     Precautions / Restrictions Precautions Precautions: Fall      Mobility  Bed Mobility Overal bed mobility: Needs Assistance Bed Mobility: Supine to Sit     Supine to sit: Contact guard, HOB elevated, Used rails          Transfers Overall transfer level: Needs assistance Equipment used: Rolling walker (2 wheels) Transfers:  Sit to/from Stand, Bed to chair/wheelchair/BSC Sit to Stand: Min assist   Step pivot transfers: Min assist       General transfer comment: multimodal cues for positioning and technique, pt reported increased nausea upon standing and returned to sitting, assisted with stand and step pivot to recliner for OOB, pt poor historian in regards to nausea and reported dizziness    Ambulation/Gait                  Stairs            Wheelchair Mobility     Tilt Bed    Modified Rankin (Stroke Patients Only)       Balance Overall balance assessment: Needs assistance         Standing balance support: During functional activity, Bilateral upper extremity supported Standing balance-Leahy Scale: Poor                               Pertinent Vitals/Pain Pain Assessment Pain Assessment: Faces Faces Pain Scale: Hurts even more Pain Location: back Pain Descriptors / Indicators: Sore, Aching Pain Intervention(s): Monitored during session, Repositioned    Home Living Family/patient expects to be discharged to:: Private residence Living Arrangements: Spouse/significant other Available Help at Discharge: Family Type of Home: House Home Access: Stairs to enter Entrance Stairs-Rails: Right;Left;Can reach both Secretary/administrator of Steps: 3   Home Layout: One level Home Equipment: Rollator (4 wheels) Additional Comments: husband is also on a RW    Prior Function Prior Level of Function : Needs assist  Mobility Comments: typically able to ambulate to/from bathroom without assistive device, mostly remains in bed, states she is able to perform steps into/out of home for appointments       Extremity/Trunk Assessment        Lower Extremity Assessment Lower Extremity Assessment: Generalized weakness    Cervical / Trunk Assessment Cervical / Trunk Assessment: Kyphotic  Communication   Communication Communication: No apparent  difficulties    Cognition Arousal: Alert Behavior During Therapy: WFL for tasks assessed/performed   PT - Cognitive impairments: No apparent impairments                         Following commands: Intact       Cueing       General Comments      Exercises     Assessment/Plan    PT Assessment Patient needs continued PT services  PT Problem List Decreased strength;Decreased activity tolerance;Decreased balance;Decreased knowledge of use of DME;Decreased mobility       PT Treatment Interventions DME instruction;Gait training;Therapeutic exercise;Balance training;Therapeutic activities;Functional mobility training;Patient/family education    PT Goals (Current goals can be found in the Care Plan section)  Acute Rehab PT Goals PT Goal Formulation: With patient Time For Goal Achievement: 02/11/24 Potential to Achieve Goals: Good    Frequency Min 2X/week     Co-evaluation               AM-PAC PT 6 Clicks Mobility  Outcome Measure Help needed turning from your back to your side while in a flat bed without using bedrails?: A Little Help needed moving from lying on your back to sitting on the side of a flat bed without using bedrails?: A Little Help needed moving to and from a bed to a chair (including a wheelchair)?: A Little Help needed standing up from a chair using your arms (e.g., wheelchair or bedside chair)?: A Little Help needed to walk in hospital room?: A Lot Help needed climbing 3-5 steps with a railing? : A Lot 6 Click Score: 16    End of Session Equipment Utilized During Treatment: Gait belt Activity Tolerance: Patient tolerated treatment well Patient left: in chair;with call bell/phone within reach (with OT) Nurse Communication: Mobility status PT Visit Diagnosis: Unsteadiness on feet (R26.81);Muscle weakness (generalized) (M62.81)    Time: 8795-8778 PT Time Calculation (min) (ACUTE ONLY): 17 min   Charges:   PT Evaluation $PT Eval  Low Complexity: 1 Low   PT General Charges $$ ACUTE PT VISIT: 1 Visit        Tari PT, DPT Physical Therapist Acute Rehabilitation Services Office: [phone]   Kati L Payson 01/28/2024, 1:40 PM

## 2024-01-28 NOTE — Evaluation (Signed)
 Occupational Therapy Evaluation Patient Details Name: Linda Kent MRN: [id number] DOB: 03/12/1945 Today's Date: 01/28/2024   History of Present Illness   Linda Kent is a 79 y.o. female presenting to [facility] ED with 4 days of intractable vomiting and diarrhea, CT without evidence of colitis, obstruction, severe constipation or other acute finding.PMH:  insomnia, hypothyroidism, IBS, chronic pain on oxycodone      Clinical Impressions PTA, patient lives at home with husband (he uses a RW) and reports she has been limited with mobility and ADL's due to chronic pain and a recent fall in Dec 2025 but was amb without her rollator short household distances for toileting and to recliner; sponge bathing at baseline. Husband and local son assist with IADL's and transportation.  Currently, patient presents with deficits outlined below (see OT Problem List for details) most significantly nausea and pain with limited activity tolerance, balance, coordination, with generalized muscle weakness limiting BADL's (max A LB) and functional mobility (min A +2 SPT to recliner) performance. Patient will benefit from continued inpatient follow up therapy, <3 hours/day. Patient requires continued Acute care hospital level OT services to progress safety and functional performance and allow for discharge.       If plan is discharge home, recommend the following:   Two people to help with walking and/or transfers;A lot of help with bathing/dressing/bathroom;Assistance with cooking/housework;Assist for transportation;Help with stairs or ramp for entrance     Functional Status Assessment   Patient has had a recent decline in their functional status and demonstrates the ability to make significant improvements in function in a reasonable and predictable amount of time.     Equipment Recommendations   BSC/3in1      Precautions/Restrictions   Precautions Precautions: Fall Restrictions Weight Bearing  Restrictions Per Provider Order: No     Mobility Bed Mobility Overal bed mobility: Needs Assistance Bed Mobility: Supine to Sit     Supine to sit: Min assist, Mod assist, HOB elevated, Used rails          Transfers Overall transfer level: Needs assistance Equipment used: Rolling walker (2 wheels) Transfers: Sit to/from Stand, Bed to chair/wheelchair/BSC Sit to Stand: Min assist, +2 physical assistance, +2 safety/equipment, From elevated surface     Step pivot transfers: Min assist, +2 physical assistance, +2 safety/equipment            Balance Overall balance assessment: Needs assistance, History of Falls Sitting-balance support: Feet supported Sitting balance-Leahy Scale: Fair   Postural control: Posterior lean Standing balance support: Bilateral upper extremity supported, During functional activity, Reliant on assistive device for balance Standing balance-Leahy Scale: Poor                             ADL either performed or assessed with clinical judgement   ADL Overall ADL's : Needs assistance/impaired Eating/Feeding: Set up;Sitting   Grooming: Wash/dry hands;Wash/dry face;Oral care;Contact guard assist;Sitting   Upper Body Bathing: Minimal assistance;Sitting   Lower Body Bathing: Maximal assistance;Sitting/lateral leans   Upper Body Dressing : Minimal assistance;Sitting   Lower Body Dressing: Maximal assistance;Sitting/lateral leans;Cueing for sequencing;Cueing for safety   Toilet Transfer:  (deferred)   Toileting- Clothing Manipulation and Hygiene: Maximal assistance;Bed level Toileting - Clothing Manipulation Details (indicate cue type and reason): usign purewick but can SPT for [facility]     Functional mobility during ADLs: Minimal assistance;+2 for physical assistance;+2 for safety/equipment;Rolling walker (2 wheels) General ADL Comments: limited by fatigue, nausea  Vision Baseline Vision/History: 0 No visual deficits;1 Wears glasses               Pertinent Vitals/Pain Pain Assessment Pain Assessment: Faces Faces Pain Scale: Hurts a little bit Pain Location: back, nausea Pain Descriptors / Indicators: Grimacing, Guarding Pain Intervention(s): Limited activity within patient's tolerance, Monitored during session, Premedicated before session, Repositioned, Relaxation     Extremity/Trunk Assessment Upper Extremity Assessment Upper Extremity Assessment: Generalized weakness;Left hand dominant;RUE deficits/detail;LUE deficits/detail (generalized head tremor noted) RUE Coordination: decreased fine motor LUE Coordination: decreased fine motor   Lower Extremity Assessment Lower Extremity Assessment: Generalized weakness   Cervical / Trunk Assessment Cervical / Trunk Assessment: Kyphotic   Communication Communication Communication: No apparent difficulties   Cognition Arousal: Alert Behavior During Therapy: Anxious Cognition: No apparent impairments             OT - Cognition Comments: does require redirection as patient can be tangential                 Following commands: Impaired Following commands impaired: Follows one step commands with increased time, Follows one step commands inconsistently     Cueing  General Comments   Cueing Techniques: Verbal cues  reports lightheadedness head spinning and nausea limiting tolerance, remained 98% on RA           Home Living Family/patient expects to be discharged to:: Private residence Living Arrangements: Spouse/significant other Available Help at Discharge: Family Type of Home: House Home Access: Stairs to enter Secretary/administrator of Steps: 3 Entrance Stairs-Rails: Right;Left;Can reach both Home Layout: One level     Bathroom Shower/Tub: Sponge bathes at baseline   [employer]: Handicapped height Bathroom Accessibility: Yes How Accessible: Accessible via walker Home Equipment: Rollator (4 wheels)   Additional Comments:  husband is also on a RW      Prior Functioning/Environment Prior Level of Function : Needs assist             Mobility Comments: typically able to ambulate to/from bathroom without assistive device, mostly remains in bed, states she is able to perform steps into/out of home for appointments ADLs Comments: reports spending most of days in recliner or bed watching tv, sponge bathes at baseline but was able to toilet indep    OT Problem List: Decreased strength;Decreased activity tolerance;Impaired balance (sitting and/or standing);Decreased coordination;Decreased safety awareness;Decreased knowledge of use of DME or AE;Decreased knowledge of precautions;Cardiopulmonary status limiting activity;Pain   OT Treatment/Interventions: Self-care/ADL training;Therapeutic exercise;Neuromuscular education;Energy conservation;DME and/or AE instruction;Therapeutic activities;Patient/family education;Balance training      OT Goals(Current goals can be found in the care plan section)   Acute Rehab OT Goals Patient Stated Goal: to get to feel better OT Goal Formulation: With patient/family Time For Goal Achievement: 02/11/24 Potential to Achieve Goals: Fair   OT Frequency:  Min 2X/week       AM-PAC OT 6 Clicks Daily Activity     Outcome Measure Help from another person eating meals?: A Little Help from another person taking care of personal grooming?: A Little Help from another person toileting, which includes using toliet, bedpan, or urinal?: Total Help from another person bathing (including washing, rinsing, drying)?: A Lot Help from another person to put on and taking off regular upper body clothing?: A Lot Help from another person to put on and taking off regular lower body clothing?: A Lot 6 Click Score: 13   End of Session Equipment Utilized During Treatment: Gait belt;Rolling walker (2 wheels) Nurse Communication: Mobility  status  Activity Tolerance: Patient limited by  fatigue Patient left: in chair;with call bell/phone within reach;with chair alarm set  OT Visit Diagnosis: Unsteadiness on feet (R26.81);History of falling (Z91.81);Pain Pain - part of body:  (back)                Time: 8651-8571 OT Time Calculation (min): 40 min Charges:  OT General Charges $OT Visit: 1 Visit OT Evaluation $OT Eval Low Complexity: 1 Low OT Treatments $Self Care/Home Management : 8-22 mins Pal Shell OT/L Acute Rehabilitation Department  [phone]  01/28/2024, 1:41 PM

## 2024-01-28 NOTE — Plan of Care (Signed)

## 2024-01-28 NOTE — Progress Notes (Signed)
" °  Progress Note   Patient: Linda Kent FMW:[id number] DOB: [birth year] DOA: 01/26/2024     1 DOS: the patient was seen and examined on 01/28/2024        Brief hospital course: 79 y.o. F with chronic myofascial pain on high dose daily oxycodone  and Norco, ADHD on Ritalin , and hypothyroidism who presented with diarrhea and vomiting for 4 days, now too weak to stand.  In the ER, vital signs normal, electrolytes, renal function, and CBC normal.  CT abdomen and pelvis unremarkable.  Was going to be discharged, but could not keep any food down, and so hospitalist service was asked to observe for fluids.     Assessment and Plan: * Intractable vomiting C. difficile negative.  Influenza negative.  Overnight reports that she is not able to keep anything down AST minimally elevated. -Continue antiemetics - Loperamide  - Monitor oral intake, ADAT, will resume fluids if not able to keep -Trend LFTs   Hypokalemia - Supplement K - Check mag  Chronic pain syndrome - Continue home oxycodone  and Norco and topiramate   ADHD - Hold home Ritalin   Hypothyroidism - Continue home levothyroxine           Subjective: Patient reports ongoing diarrhea, ongoing vomiting.  This is not reflected in the flowsheets.  She has had no fever, confusion, cough, shortness of breath.     Physical Exam: BP 124/66 (BP Location: Right Arm)   Pulse 82   Temp 98.6 F (37 C)   Resp 18   Wt 55.4 kg   SpO2 97%   BMI 20.96 kg/m   Thin elderly female, lying in bed, tired, interactive RRR, no murmurs, no peripheral edema Respiratory rate normal, lungs clear without rales or wheezes Abdomen soft, no tenderness palpation at all in all quadrants, no guarding, no distention, no masses Attention normal, affect anxious, judgment and insight appear at baseline, face symmetric, upper extremity strength normal, speech fluent    Data Reviewed: Metabolic panel shows hypokalemia, normal renal function LFTs show  minimal elevation of AST CBC shows no leukocytosis or anemia or thrombocytopenia  Flu/rsv/covid swab  Negative C. difficile negative   Family Communication:     Disposition: Status is: Inpatient         Author: Lonni SHAUNNA Dalton, MD 01/28/2024 8:30 AM  For on call review [email or web address]    "

## 2024-01-28 NOTE — Progress Notes (Signed)
 OT Cancellation Note  Patient Details Name: Linda Kent MRN: [id number] DOB: July 27, 1945   Cancelled Treatment:    Reason Eval/Treat Not Completed: Medical issues which prohibited therapy  Arrived for OT evaluation. Patient just rec'd anti-nausea meds and requested OT wait and come back in 30 minutes. OT will re-attempt visit shortly.   Arin Vanosdol OT/L Acute Rehabilitation Department  [phone]   01/28/2024, 10:39 AM

## 2024-01-29 DIAGNOSIS — R1110 Vomiting, unspecified: Secondary | ICD-10-CM | POA: Diagnosis not present

## 2024-01-29 LAB — POTASSIUM: Potassium: 4.2 mmol/L (ref 3.5–5.1)

## 2024-01-29 NOTE — Plan of Care (Signed)

## 2024-01-29 NOTE — Progress Notes (Signed)
" °  Progress Note   Patient: Linda Kent FMW:[id number] DOB: 1945-06-30 DOA: 01/26/2024     2 DOS: the patient was seen and examined on 01/29/2024 at 7:50 AM      Brief hospital course: 79 y.o. F with chronic myofascial pain on high dose daily oxycodone  and Norco, ADHD on Ritalin , and hypothyroidism who presented with diarrhea and vomiting for 4 days, now too weak to stand.  In the ER, vital signs normal, electrolytes, renal function, and CBC normal.  CT abdomen and pelvis unremarkable.  Was going to be discharged, but could not keep any food down, and so hospitalist service was asked to observe for fluids.     Assessment and Plan: * Intractable vomiting C. difficile negative, flu negative.  Vomiting resolved, tolerating oral diet - Stop IV fluids   Hypokalemia Magnesium normal, potassium supplemented and resolved  Chronic pain syndrome - Continue home oxycodone  and Norco and topiramate   ADHD - Hold home Ritalin   Hypothyroidism - Continue home levothyroxine           Subjective: Patient is doing better, her diarrhea and vomiting have resolved, she is still extremely weak, unable to get out of bed by herself.  No fever, no confusion.       Physical Exam: BP 116/73 (BP Location: Right Arm)   Pulse 84   Temp 98.5 F (36.9 C) (Oral)   Resp 17   Wt 55.4 kg   SpO2 96%   BMI 20.96 kg/m   Thin elderly female, lying in bed, appears weak and tired RRR, no murmurs, no peripheral edema Respiratory normal, lungs clear without rales or wheezes Abdomen soft, no tenderness palpation or guarding, no ascites or distention Oriented x 3, generalized weakness but symmetric strength, face symmetric, speech fluent    Data Reviewed: Potassium improved to 4.2    Family Communication: husband by phone    Disposition: Status is: Inpatient         Author: Lonni SHAUNNA Dalton, MD 01/29/2024 2:04 PM  For on call review [email or web address]    "

## 2024-01-30 DIAGNOSIS — F1193 Opioid use, unspecified with withdrawal: Secondary | ICD-10-CM | POA: Insufficient documentation

## 2024-01-30 NOTE — TOC Initial Note (Signed)
 Transition of Care [facility]) - Initial/Assessment Note    Patient Details  Name: Linda Kent MRN: [id number] Date of Birth: 1945-02-12  Transition of Care [facility]) CM/SW Contact:    Doneta Glenys DASEN, RN Phone Number: 01/30/2024, 3:29 PM  Clinical Narrative:                 CM called Minichiello,Carl L Spouse,  [phone] - Left Message   Patient Goals and CMS Choice            Expected Discharge Plan and Services                                              Prior Living Arrangements/Services                       Activities of Daily Living   ADL Screening (condition at time of admission) Independently performs ADLs?: Yes (appropriate for developmental age) Is the patient deaf or have difficulty hearing?: No Does the patient have difficulty seeing, even when wearing glasses/contacts?: No Does the patient have difficulty concentrating, remembering, or making decisions?: No  Permission Sought/Granted                  Emotional Assessment              Admission diagnosis:  Intractable vomiting [R11.10] Vomiting without nausea, unspecified vomiting type [R11.11] Patient Active Problem List   Diagnosis Date Noted   Hypokalemia 01/28/2024   Chronic pain syndrome 01/27/2024   Intractable vomiting 01/26/2024   Statin intolerance 08/30/2017   [place] cardiac risk 10-20% in next 10 years 08/30/2017   ADHD 06/14/2017   Myofascial pain syndrome 07/17/2012   MIXED HYPERLIPIDEMIA 11/25/2009   LEUKOPENIA, MILD 11/25/2009   IRRITABLE BOWEL SYNDROME 11/25/2009   Hypothyroidism 10/29/2008   Anxiety state 06/20/2008   ARTHRITIS 04/16/2008   Migraine headache 09/26/2007   ALLERGIC RHINITIS 09/26/2007   ROSACEA 09/26/2007   Osteoporosis 09/26/2007   INSOMNIA, CHRONIC 09/22/2006   POSTMENOPAUSAL STATUS 09/22/2006   Depression 09/22/2006   Headache 09/15/2006   PCP:  Johnny Garnette LABOR, MD Pharmacy:   [facility] [place], [place] - [address]  [address] [place] [address] Phone: [phone] Fax: [phone]  [facility] [address] [place] [facility] [address] [place] [address] Phone: [phone] Fax: [phone]     Social Drivers of Health (SDOH) Social History: SDOH Screenings   Food Insecurity: No Food Insecurity (01/27/2024)  Housing: Low Risk (01/27/2024)  Transportation Needs: No Transportation Needs (01/27/2024)  Utilities: Not At Risk (01/27/2024)  Depression (PHQ2-9): Low Risk (12/26/2023)  Social Connections: Moderately Isolated (01/27/2024)  Tobacco Use: Low Risk (01/28/2024)   SDOH Interventions:     Readmission Risk Interventions     No data to display

## 2024-01-30 NOTE — Assessment & Plan Note (Signed)
 Acute metabolic encephalopathy At baseline she has no cognitive impairment.  In the last 24 hours, she has developed some confusion, short term memory loss and disorientation.    Appears that after resolution of her gastroenteritis, she decided she wanted to come off of oxycodone  and Norco, cold turkey.  We have discussed that this will cause withdrawal, but she is adamant. - Ondansetron  as needed - May have oxycodone  or hydrocodone  as needed - May have Ativan  as needed

## 2024-01-30 NOTE — Progress Notes (Signed)
" °  Progress Note   Patient: Linda Kent FMW:[id number] DOB: Dec 15, 1945 DOA: 01/26/2024     3 DOS: the patient was seen and examined on 01/30/2024 at 8:55 AM      Brief hospital course: 79 y.o. F with chronic myofascial pain on high dose daily oxycodone  and Norco, ADHD on Ritalin , and hypothyroidism who presented with diarrhea and vomiting for 4 days, now too weak to stand.  In the ER, vital signs normal, electrolytes, renal function, and CBC normal.  CT abdomen and pelvis unremarkable.  Was going to be discharged, but could not keep any food down, and so hospitalist service was asked to observe for fluids.     Assessment and Plan: * Intractable vomiting likely viral gastroenteritis C. difficile negative, flu negative. Diarrhea resolved, tolerating oral diet.   Opiate withdrawal (HCC) Acute metabolic encephalopathy At baseline she has no cognitive impairment.  In the last 24 hours, she has developed some confusion, short term memory loss and disorientation.    Appears that after resolution of her gastroenteritis, she decided she wanted to come off of oxycodone  and Norco, cold turkey.  We have discussed that this will cause withdrawal, but she is adamant. - Ondansetron  as needed - May have oxycodone  or hydrocodone  as needed - May have Ativan  as needed  Hypokalemia - Supplement K - Check mag  Chronic pain syndrome See above  ADHD - Hold home Ritalin   Hypothyroidism - Continue home levothyroxine           Subjective: Patient feels anxious, wants us  to let her die, she is very restless, she was vomiting last night.  She is tremulous and flushed.     Physical Exam: BP 115/67 (BP Location: Left Arm)   Pulse 85   Temp 98.8 F (37.1 C) (Oral)   Resp 16   Wt 55.4 kg   SpO2 99%   BMI 20.96 kg/m   Thin adult female, lying in bed, mydriasis, flushed, tremulous Tachycardic, regular, soft systolic murmur, no peripheral edema Respiratory rate normal, lungs clear  without rales or wheezes Abdomen soft, no tenderness palpation no guarding She is attentive to questions, responds appropriately, oriented to self, but short-term memory seems impaired, processing seems impaired, and she is a little confused, upper extremity strength is symmetric and bilateral    Data Reviewed: Basic metabolic panel shows mild hypokalemia, normal renal function CBC normal     Family Communication:     Disposition: Status is: Inpatient         Author: Lonni SHAUNNA Dalton, MD 01/30/2024 4:03 PM  For on call review [email or web address]    "

## 2024-01-31 NOTE — Plan of Care (Signed)

## 2024-01-31 NOTE — Progress Notes (Signed)
 Occupational Therapy Treatment Patient Details Name: Linda Kent MRN: [id number] DOB: February 21, 1945 Today's Date: 01/31/2024   History of present illness Linda Kent is a 79 yr old female presenting to [facility] ED with 4 days of intractable vomiting and diarrhea, CT without evidence of colitis, obstruction, severe constipation or other acute finding.PMH:  insomnia, hypothyroidism, IBS, chronic pain on oxycodone    OT comments  The pt was pleasant and very motivated to participate in the session. Pt was instructed on rolling left and right in bed, for which she required SBA. She was subsequently instructed on bridging in bed and scooting towards the head of the bed, for which she needed min assist. She was then instructed on BUE and BLE therapeutic exercises for strengthening needed to facilitate progressive ADL performance. She required SBA to perform 10 reps and 1 set of such exercises as horizontal exchanges using a light resistance exercise band, tricep extensions using a light resistance exercise band, ankle pumps and hip abduction/adduction. She reported having 5/10 L low back pain, for which she used a heating pad to manage at home. OT gave her a heat pack to assist with pain management and instructed her on improved positioning in bed to decrease strain on her low back. Attempts at out of bed activity were deferred this date, due to pt with issues with ongoing nausea today, which was exacerbated with progressive activity. She put forth good effort. Continue OT plan of care. Patient will benefit from continued inpatient follow up therapy, <3 hours/day.         If plan is discharge home, recommend the following:  A lot of help with bathing/dressing/bathroom;Assistance with cooking/housework;Assist for transportation;Help with stairs or ramp for entrance;A lot of help with walking and/or transfers   Equipment Recommendations  BSC/3in1    Recommendations for Other Services      Precautions /  Restrictions Precautions Precautions: Fall Restrictions Weight Bearing Restrictions Per Provider Order: No       Mobility Bed Mobility Overal bed mobility: Needs Assistance Bed Mobility: Rolling Rolling: Supervision         General bed mobility comments: Pt was instructed on rolling left and right in bed, for which she required SBA. She was subsequently instructed on bridging in bed and scooting towards the head of the bed, for which she needed min assist           ADL either performed or assessed with clinical judgement   ADL   Eating/Feeding: Set up;Bed level   Grooming: Set up;Bed level               Lower Body Dressing: Maximal assistance;Bed level            Vision Baseline Vision/History: 1 Wears glasses           Communication Communication Communication: No apparent difficulties   Cognition Arousal: Alert Behavior During Therapy: WFL for tasks assessed/performed Cognition: No apparent impairments             OT - Cognition Comments: required occasional redirection due to being pleasantly conversational        Following commands: Intact        Cueing   Cueing Techniques: Verbal cues             Pertinent Vitals/ Pain       Pain Assessment Pain Assessment: 0-10 Pain Score: 5  Pain Location: low back Pain Intervention(s): Limited activity within patient's tolerance, Monitored during session, Repositioned, Heat applied  Frequency  Min 2X/week        Progress Toward Goals  OT Goals(current goals can now be found in the care plan section)     Acute Rehab OT Goals Patient Stated Goal: to get better OT Goal Formulation: With patient Time For Goal Achievement: 02/11/24 Potential to Achieve Goals: Good ADL Goals Pt Will Perform Upper Body Dressing: with set-up;sitting Pt Will Perform Lower Body Dressing: with set-up;with supervision;sit to/from stand;sitting/lateral leans Pt Will Transfer to Toilet: with  supervision;with set-up;stand pivot transfer;bedside commode Pt Will Perform Toileting - Clothing Manipulation and hygiene: with set-up;with supervision;sit to/from stand  Plan         AM-PAC OT 6 Clicks Daily Activity     Outcome Measure   Help from another person eating meals?: A Little Help from another person taking care of personal grooming?: A Little Help from another person toileting, which includes using toliet, bedpan, or urinal?: A Lot Help from another person bathing (including washing, rinsing, drying)?: A Lot Help from another person to put on and taking off regular upper body clothing?: A Little Help from another person to put on and taking off regular lower body clothing?: A Lot 6 Click Score: 15    End of Session Equipment Utilized During Treatment: Other (comment) (N/A)  OT Visit Diagnosis: Unsteadiness on feet (R26.81);History of falling (Z91.81);Pain;Muscle weakness (generalized) (M62.81)   Activity Tolerance Patient limited by pain;Patient tolerated treatment well   Patient Left in bed;with call bell/phone within reach;with bed alarm set   Nurse Communication Mobility status        Time: 8298-8276 OT Time Calculation (min): 22 min  Charges: OT General Charges $OT Visit: 1 Visit OT Treatments $Therapeutic Activity: 8-22 mins    Delanna JINNY Lesches, OTR/L 01/31/2024, 6:12 PM

## 2024-01-31 NOTE — Progress Notes (Signed)
 Mobility Specialist - Progress Note   01/31/24 1300  Mobility  Activity Pivoted/transferred from bed to chair  Level of Assistance +2 (takes two people)  Assistive Device None  Activity Response Tolerated well  Mobility Referral Yes  Mobility visit 1 Mobility  Mobility Specialist Start Time (ACUTE ONLY) 1322  Mobility Specialist Stop Time (ACUTE ONLY) 1330  Mobility Specialist Time Calculation (min) (ACUTE ONLY) 8 min   Assisted NT in transfer of pt, received on EOB. Min A of two to stand, using handheld support to power up, shuffled to recliner where pt was left with all needs met.  Cyndee Ada Mobility Specialist

## 2024-01-31 NOTE — Progress Notes (Signed)
" °  Progress Note   Patient: Linda Kent FMW:[id number] DOB: 1945-07-05 DOA: 01/26/2024     4 DOS: the patient was seen and examined on 01/31/2024 at 8:55 AM      Brief hospital course: 79 y.o. F with chronic myofascial pain on high dose daily oxycodone  and Norco, ADHD on Ritalin , and hypothyroidism who presented with diarrhea and vomiting for 4 days, now too weak to stand.  In the ER, vital signs normal, electrolytes, renal function, and CBC normal.  CT abdomen and pelvis unremarkable.  Was going to be discharged, but could not keep any food down, and so hospitalist service was asked to observe for fluids.     Assessment and Plan: * Intractable vomiting likely viral gastroenteritis C. difficile negative, flu negative. Diarrhea resolved, tolerating oral diet.   Opiate withdrawal (HCC) Acute metabolic encephalopathy At baseline she has no cognitive impairment.  In the last 24 hours, she has developed some confusion, short term memory loss and disorientation.    Appears that after resolution of her gastroenteritis, she decided she wanted to come off of oxycodone  and Norco, cold turkey.  We have discussed that this will cause withdrawal, but she is adamant.  Note: at home she takes oxycodone  20 mg 6x daily, hydrocodone  10 mg 4x daily, as well as lorazepam  2 mg up to 3x daily  - Ondansetron  as needed - May have oxycodone  or hydrocodone  as needed --> encouraged to take, to limit withdrawals - May have Ativan  as needed  Hypokalemia Mag normal, supplemented as above  Chronic pain syndrome See above  ADHD - Hold home Ritalin   Hypothyroidism - Continue home levothyroxine           Subjective: Patient still somewhat disoriented due to withdrawal from opiates.  Her vomiting and diarrhea have resolved, she is taking good oral intake     Physical Exam: BP (!) 95/59 (BP Location: Right Arm)   Pulse 80   Temp 98.4 F (36.9 C) (Oral)   Resp 19   Wt 55.4 kg   SpO2 98%    BMI 20.96 kg/m   Elderly adult female, sitting up in bed, restless Tachycardic, no murmurs, no peripheral edema Respiratory rate normal, lungs clear without rales or wheezes Abdomen soft, no tenderness palpation or guarding She is restless, skin slightly flushed, pupils dilated, she is oriented to self and place, but speech is pressured, and I believe that she has some short-term memory impairment right now     Family Communication: Called to son, both numbers, left voicemail    Disposition: Status is: Inpatient 79 y.o. F admitted for gastroenteritis.  This is resolved, however she has some encephalopathy from opiate withdrawal.  I think she will need monitoring for another day or 2 to make sure that her mentation is stable for discharge to rehab, then possibly to rehab later this week        Author: Lonni SHAUNNA Dalton, MD 01/31/2024 5:33 PM  For on call review [email or web address]    "

## 2024-01-31 NOTE — Plan of Care (Signed)

## 2024-01-31 NOTE — TOC Initial Note (Signed)
 Transition of Care [facility]) - Initial/Assessment Note    Patient Details  Name: Linda Kent MRN: [id number] Date of Birth: 04/11/1945  Transition of Care [facility]) CM/SW Contact:    Linda Glenys DASEN, RN Phone Number: 01/31/2024, 12:01 PM  Clinical Narrative:                 PTA home; Patient agreeable to SNF.  IP CM will follow.  Expected Discharge Plan: Skilled Nursing Facility Barriers to Discharge: Continued Medical Work up   Patient Goals and CMS Choice Patient states their goals for this hospitalization and ongoing recovery are:: SNF CMS Medicare.gov Compare Post Acute Care list provided to:: Patient Choice offered to / list presented to : Patient [place] ownership interest in [facility]provided to:: Patient    Expected Discharge Plan and Services   Discharge Planning Services: CM Consult   Living arrangements for the past 2 months: Single Family Home                 DME Arranged: N/A DME Agency: NA       HH Arranged: NA HH Agency: NA        Prior Living Arrangements/Services Living arrangements for the past 2 months: Single Family Home Lives with:: Spouse Patient language and need for interpreter reviewed:: Yes Do you feel safe going back to the place where you live?: Yes      Need for Family Participation in Patient Care: Yes (Comment) Care giver support system in place?: Yes (comment) Current home services: DME (NA) Criminal Activity/Legal Involvement Pertinent to Current Situation/Hospitalization: No - Comment as needed  Activities of Daily Living   ADL Screening (condition at time of admission) Independently performs ADLs?: Yes (appropriate for developmental age) Is the patient deaf or have difficulty hearing?: No Does the patient have difficulty seeing, even when wearing glasses/contacts?: No Does the patient have difficulty concentrating, remembering, or making decisions?: No  Permission Sought/Granted Permission sought to share  information with : Case Manager, Magazine Features Editor Permission granted to share information with : Yes, Verbal Permission Granted  Share Information with NAME: Linda, Kent, Emergency Contact  [phone]  Permission granted to share info w AGENCY: SNF        Emotional Assessment Appearance:: Appears stated age Attitude/Demeanor/Rapport: Engaged Affect (typically observed): Appropriate Orientation: : Oriented to Self, Oriented to Place, Oriented to Situation Alcohol / Substance Use: Not Applicable Psych Involvement: No (comment)  Admission diagnosis:  Intractable vomiting [R11.10] Vomiting without nausea, unspecified vomiting type [R11.11] Patient Active Problem List   Diagnosis Date Noted   Opiate withdrawal (HCC) 01/30/2024   Hypokalemia 01/28/2024   Chronic pain syndrome 01/27/2024   Intractable vomiting likely viral gastroenteritis 01/26/2024   Statin intolerance 08/30/2017   [place] cardiac risk 10-20% in next 10 years 08/30/2017   ADHD 06/14/2017   Myofascial pain syndrome 07/17/2012   MIXED HYPERLIPIDEMIA 11/25/2009   LEUKOPENIA, MILD 11/25/2009   IRRITABLE BOWEL SYNDROME 11/25/2009   Hypothyroidism 10/29/2008   Anxiety state 06/20/2008   ARTHRITIS 04/16/2008   Migraine headache 09/26/2007   ALLERGIC RHINITIS 09/26/2007   ROSACEA 09/26/2007   Osteoporosis 09/26/2007   INSOMNIA, CHRONIC 09/22/2006   POSTMENOPAUSAL STATUS 09/22/2006   Depression 09/22/2006   Headache 09/15/2006   PCP:  Linda Garnette LABOR, MD Pharmacy:   [facility] [place], [place] - [address] [facility] [address] [address] [place] [address] Phone: [phone] Fax: [phone]  [id number] [place], [place] -  [address] [place] [address] Phone: [phone] Fax: [phone]     Social Drivers of Health (SDOH) Social History: SDOH Screenings   Food Insecurity: No Food Insecurity (01/27/2024)  Housing:  Low Risk (01/27/2024)  Transportation Needs: No Transportation Needs (01/27/2024)  Utilities: Not At Risk (01/27/2024)  Depression (PHQ2-9): Low Risk (12/26/2023)  Social Connections: Moderately Isolated (01/27/2024)  Tobacco Use: Low Risk (01/28/2024)   SDOH Interventions:     Readmission Risk Interventions    01/31/2024   11:58 AM  Readmission Risk Prevention Plan  Transportation Screening Complete  PCP or Specialist Appt within 5-7 Days Complete  Home Care Screening Complete  Medication Review (RN CM) Complete

## 2024-02-01 DIAGNOSIS — R1110 Vomiting, unspecified: Secondary | ICD-10-CM | POA: Diagnosis not present

## 2024-02-01 LAB — CBC
HCT: 42.1 % (ref 36.0–46.0)
Hemoglobin: 13.9 g/dL (ref 12.0–15.0)
MCH: 32.6 pg (ref 26.0–34.0)
MCHC: 33 g/dL (ref 30.0–36.0)
MCV: 98.8 fL (ref 80.0–100.0)
Platelets: 242 K/uL (ref 150–400)
RBC: 4.26 MIL/uL (ref 3.87–5.11)
RDW: 14.2 % (ref 11.5–15.5)
WBC: 7.9 K/uL (ref 4.0–10.5)
nRBC: 0 % (ref 0.0–0.2)

## 2024-02-01 LAB — COMPREHENSIVE METABOLIC PANEL WITH GFR
ALT: 25 U/L (ref 0–44)
AST: 35 U/L (ref 15–41)
Albumin: 3.6 g/dL (ref 3.5–5.0)
Alkaline Phosphatase: 125 U/L (ref 38–126)
Anion gap: 11 (ref 5–15)
BUN: 14 mg/dL (ref 8–23)
CO2: 22 mmol/L (ref 22–32)
Calcium: 8.9 mg/dL (ref 8.9–10.3)
Chloride: 105 mmol/L (ref 98–111)
Creatinine, Ser: 0.67 mg/dL (ref 0.44–1.00)
GFR, Estimated: >60 mL/min
Glucose, Bld: 110 mg/dL — ABNORMAL HIGH (ref 70–99)
Potassium: 3.5 mmol/L (ref 3.5–5.1)
Sodium: 138 mmol/L (ref 135–145)
Total Bilirubin: 0.4 mg/dL (ref 0.0–1.2)
Total Protein: 6.2 g/dL — ABNORMAL LOW (ref 6.5–8.1)

## 2024-02-01 MED ORDER — ENSURE PLUS HIGH PROTEIN PO LIQD
237.0000 mL | Freq: Two times a day (BID) | ORAL | Status: DC
  Administered 2024-02-02: 237 mL via ORAL

## 2024-02-01 MED ORDER — DICLOFENAC SODIUM 1 % EX GEL
4.0000 g | Freq: Four times a day (QID) | CUTANEOUS | Status: DC
  Administered 2024-02-01: 4 g via TOPICAL
  Filled 2024-02-01: qty 100

## 2024-02-01 NOTE — TOC Progression Note (Addendum)
 Transition of Care [facility]) - Progression Note    Patient Details  Name: Linda Kent MRN: [id number] Date of Birth: 05-28-1945  [facility]) CM/SW Contact  Doneta Glenys DASEN, RN Phone Number: 02/01/2024, 9:22 AM  Clinical Narrative:    12:08 PM Patient is not able to make a decision and requested I contact Carrizales,Stephen(son,) [phone]. CM sent a HIPAA approved text for identity of patient. Waiting for response.  9:22 AM Choice List Given [facility], [facility]       Service Provider Request Status STAR(s) Address Phone Patient Preferred  [facility], [facility] Preferred SNF  Accepted 4 [address] [place] [address] [phone]   [facility] SNF  Accepted 2 [address], [place] [address] [phone]   [facility] Preferred SNF  Accepted 1 [address], [place] [address] [phone]   [facility]  Accepted 2 [address] [place] [address] [phone]   [facility] SNF  Accepted 1 [address] [place] [address] [phone]   [facility] Preferred SNF  Accepted 1 [address] [place] [address] [phone]   [facility] Preferred SNF  Accepted 3 [address] [place] [address] [phone]   [facility] Preferred SNF  Accepted 2 [address], [place] [address] [phone]     Expected Discharge Plan: Skilled Nursing Facility Barriers to Discharge: SNF Pending bed offer               Expected Discharge Plan and Services   Discharge Planning Services: CM Consult   Living arrangements for the past 2 months: Single Family Home                 DME Arranged: N/A DME Agency: NA       HH Arranged: NA HH Agency: NA         Social Drivers of Health (SDOH) Interventions SDOH Screenings   Food Insecurity: No Food Insecurity (01/27/2024)  Housing: Low Risk (01/27/2024)  Transportation Needs: No  Transportation Needs (01/27/2024)  Utilities: Not At Risk (01/27/2024)  Depression (PHQ2-9): Low Risk (12/26/2023)  Social Connections: Moderately Isolated (01/27/2024)  Tobacco Use: Low Risk (01/28/2024)    Readmission Risk Interventions    01/31/2024   11:58 AM  Readmission Risk Prevention Plan  Transportation Screening Complete  PCP or Specialist Appt within 5-7 Days Complete  Home Care Screening Complete  Medication Review (RN CM) Complete

## 2024-02-01 NOTE — Progress Notes (Addendum)
 " [facility] Progress Note  Patient: Linda Kent     FMW:[id number]  DOA: 01/26/2024   PCP: Johnny Garnette LABOR, MD       Brief hospital course: 79 year old female with chronic myofascial pain who takes oxycodone  and Norco at baseline presented to the hospital with vomiting diarrhea for 4 days.  While in the hospital, she decided not to go back on her full dose of narcotics and therefore had continued GI symptoms related to narcotic withdrawal. CT of the abdomen pelvis was unrevealing. She was started on clear liquid diet and supportive treatment.  Subjective:  Complains of pain in her back.  Mostly present in the right mid back.  Assessment and Plan: Principal Problem:   Intractable vomiting likely viral gastroenteritis - C. difficile negative - May have initially been gastroenteritis but subsequently was related to narcotic withdrawal - Has resolved  Active Problems:   Opiate withdrawal - Was taking oxycodone  20 mg every 4 hours as needed along with hydrocodone  10 mg every 6 hours as needed and had difficulty with is due to vomiting  Hypokalemia - K was 3.1 and has been replaced - Likely due to GI issues  Elevated alk phos - Has improved from 222-125 and was likely elevated in relation to above-mentioned GI issues  Chronic pain - Current pain is in the left mid back-we discussed trying Voltaren  gel today which I have initiated    Hypothyroidism - Continue Synthroid     ADHD - Holding medications    Disposition: Waiting for SNF    Code Status: Full Code Total time on patient care: 35 minutes DVT prophylaxis:  enoxaparin  (LOVENOX ) injection 40 mg Start: 01/27/24 1000     Objective:   Vitals:   01/31/24 0530 01/31/24 1229 01/31/24 2234 02/01/24 0527  BP: 101/61 (!) 95/59 (!) 98/57 (!) 105/59  Pulse: 85 80 75 73  Resp: 16 19 16 15   Temp: 98.2 F (36.8 C) 98.4 F (36.9 C) (!) 97.5 F (36.4 C) 97.7 F (36.5 C)  TempSrc:  Oral Oral   SpO2: 97% 98% 97%  100%  Weight:       Filed Weights   01/26/24 2200  Weight: 55.4 kg   Exam: General exam: Appears comfortable  HEENT: oral mucosa moist Respiratory system: Clear to auscultation.  Cardiovascular system: S1 & S2 heard  Gastrointestinal system: Abdomen soft, non-tender, nondistended. Normal bowel sounds   Extremities: No cyanosis, clubbing or edema MSK: Tender in right mid back on the rib cage Psychiatry:  Mood & affect appropriate.      CBC: Recent Labs  Lab 01/26/24 1608 01/26/24 1629 01/27/24 0103 01/28/24 0432 02/01/24 0441  WBC 9.7  --  9.2 6.8 7.9  NEUTROABS 7.8*  --   --   --   --   HGB 14.6 15.3* 13.1 12.0 13.9  HCT 44.2 45.0 39.3 36.6 42.1  MCV 97.1  --  98.0 97.3 98.8  PLT 312  --  256 214 242   Basic Metabolic Panel: Recent Labs  Lab 01/26/24 1608 01/26/24 1629 01/27/24 0330 01/28/24 0432 01/28/24 0855 01/29/24 0438 02/01/24 0822  NA 143 143 142 141  --   --  138  K 3.6 3.6 3.2* 3.1*  --  4.2 3.5  CL 106 109 108 108  --   --  105  CO2 21*  --  21* 24  --   --  22  GLUCOSE 116* 110* 100* 125*  --   --  110*  BUN 22 24* 18 8  --   --  14  CREATININE 0.70 0.70 0.53 0.57  --   --  0.67  CALCIUM  9.2  --  8.0* 8.1*  --   --  8.9  MG  --   --   --   --  2.1  --   --      Scheduled Meds:  aspirin  EC  81 mg Oral Daily   diclofenac  Sodium  4 g Topical QID   enoxaparin  (LOVENOX ) injection  40 mg Subcutaneous Q24H   levothyroxine   75 mcg Oral Q0600   topiramate   150 mg Oral Daily    Imaging and lab data personally reviewed   Author: Johnnie Moten  02/01/2024 6:51 PM  To contact [facility]>   Check the care team in [facility] and look for the attending/consulting [facility] provider listed  Log into [email or web address] and use [place]'s universal password   Go to> [facility]  and find provider  If you still have difficulty reaching the provider, please page the [facility] (Director on Call) for the Hospitalists listed on amion     "

## 2024-02-01 NOTE — Progress Notes (Signed)
 Physical Therapy Treatment Patient Details Name: Linda Kent MRN: [id number] DOB: [birth year] Today's Date: 02/01/2024   History of Present Illness DAPHNEY HOPKE is a 79 y.o. female presenting to [facility] ED with 4 days of intractable vomiting and diarrhea, CT without evidence of colitis, obstruction, severe constipation or other acute finding.PMH:  insomnia, hypothyroidism, IBS, chronic pain on oxycodone     PT Comments  Pt tolerates seated exercises, denies increased back pain, needing cues for motor control and increased AROM as tolerable. Pt needing supv, increased time to mobilize to bedside, using bedrail as needed. Pt needing min A to power to stand from bedside with RW, cues for hand placement, min A for each rep to power up and steady. Pt amb 40 ft with RW, recliner follow for safety, min A for steadying, low foot clearance to shuffling no overt LOB or LE buckling noted. Continue to recommend continued inpatient follow up therapy, <3 hours/day.   If plan is discharge home, recommend the following: A little help with walking and/or transfers;A little help with bathing/dressing/bathroom;Assistance with cooking/housework;Assist for transportation;Help with stairs or ramp for entrance   Can travel by private vehicle     Yes  Equipment Recommendations  None recommended by PT    Recommendations for Other Services       Precautions / Restrictions Precautions Precautions: Fall Restrictions Weight Bearing Restrictions Per Provider Order: No     Mobility  Bed Mobility Overal bed mobility: Needs Assistance Bed Mobility: Supine to Sit     Supine to sit: Supervision     General bed mobility comments: supv with intermittent bedrail use    Transfers Overall transfer level: Needs assistance Equipment used: Rolling walker (2 wheels) Transfers: Sit to/from Stand Sit to Stand: Min assist           General transfer comment: min A to power and steady to stand for 2 reps, cues for  hand placement to avoid pulling from RW    Ambulation/Gait Ambulation/Gait assistance: Min assist Gait Distance (Feet): 40 Feet Assistive device: Rolling walker (2 wheels) Gait Pattern/deviations: Decreased stance time - right, Decreased step length - left, Decreased stride length, Shuffle Gait velocity: decreased     General Gait Details: pt with short bil steps, shuffling to low step program, cues for body positioning within RW frame, recliner follow with pt return to room via chair   Stairs             Wheelchair Mobility     Tilt Bed    Modified Rankin (Stroke Patients Only)       Balance Overall balance assessment: Needs assistance, History of Falls Sitting-balance support: Feet supported Sitting balance-Leahy Scale: Fair Sitting balance - Comments: not challenged Postural control: Posterior lean Standing balance support: Bilateral upper extremity supported, During functional activity, Reliant on assistive device for balance Standing balance-Leahy Scale: Poor Standing balance comment: min A with RW                            Communication Communication Communication: No apparent difficulties  Cognition Arousal: Alert Behavior During Therapy: WFL for tasks assessed/performed                           PT - Cognition Comments: pt a&o x4 Following commands: Intact      Cueing Cueing Techniques: Verbal cues  Exercises General Exercises - Lower Extremity Long Arc Quad: AROM, Both,  10 reps, Seated Hip Flexion/Marching: AROM, Both, 10 reps, Seated    General Comments        Pertinent Vitals/Pain Pain Assessment Pain Assessment: No/denies pain (they gave me some medicine and it's better now)    Home Living                          Prior Function            PT Goals (current goals can now be found in the care plan section) Acute Rehab PT Goals PT Goal Formulation: With patient Time For Goal Achievement:  02/11/24 Potential to Achieve Goals: Good Progress towards PT goals: Progressing toward goals    Frequency    Min 2X/week      PT Plan      Co-evaluation              AM-PAC PT 6 Clicks Mobility   Outcome Measure  Help needed turning from your back to your side while in a flat bed without using bedrails?: A Little Help needed moving from lying on your back to sitting on the side of a flat bed without using bedrails?: A Little Help needed moving to and from a bed to a chair (including a wheelchair)?: A Little Help needed standing up from a chair using your arms (e.g., wheelchair or bedside chair)?: A Little Help needed to walk in hospital room?: A Little Help needed climbing 3-5 steps with a railing? : A Lot 6 Click Score: 17    End of Session Equipment Utilized During Treatment: Gait belt Activity Tolerance: Patient tolerated treatment well Patient left: in chair;with call bell/phone within reach;with chair alarm set Nurse Communication: Mobility status PT Visit Diagnosis: Unsteadiness on feet (R26.81);Muscle weakness (generalized) (M62.81)     Time: 8641-8578 PT Time Calculation (min) (ACUTE ONLY): 23 min  Charges:    $Therapeutic Exercise: 8-22 mins $Therapeutic Activity: 8-22 mins PT General Charges $$ ACUTE PT VISIT: 1 Visit                     Tori Emmilynn Marut PT, DPT 02/01/2024, 2:31 PM

## 2024-02-01 NOTE — Plan of Care (Signed)

## 2024-02-01 NOTE — Plan of Care (Signed)
   Problem: Education: Goal: Knowledge of General Education information will improve Description: Including pain rating scale, medication(s)/side effects and non-pharmacologic comfort measures Outcome: Progressing   Problem: Clinical Measurements: Goal: Ability to maintain clinical measurements within normal limits will improve Outcome: Progressing

## 2024-02-02 DIAGNOSIS — R1110 Vomiting, unspecified: Secondary | ICD-10-CM | POA: Diagnosis not present

## 2024-02-02 MED ORDER — HYDROCODONE-ACETAMINOPHEN 10-325 MG PO TABS: 1.0000 | ORAL_TABLET | Freq: Four times a day (QID) | ORAL | 0 refills | Status: AC | PRN

## 2024-02-02 MED ORDER — ZALEPLON 10 MG PO CAPS: 10.0000 mg | ORAL_CAPSULE | Freq: Every evening | ORAL | 1 refills | Status: AC | PRN

## 2024-02-02 MED ORDER — OXYCODONE HCL 20 MG PO TABS: 20.0000 mg | ORAL_TABLET | ORAL | 0 refills | Status: AC | PRN

## 2024-02-02 MED ORDER — LORAZEPAM 2 MG PO TABS: 2.0000 mg | ORAL_TABLET | Freq: Three times a day (TID) | ORAL | 0 refills | Status: AC | PRN

## 2024-02-02 MED ORDER — DICLOFENAC SODIUM 1 % EX GEL: 4.0000 g | Freq: Four times a day (QID) | CUTANEOUS | Status: AC

## 2024-02-02 MED ORDER — POLYETHYLENE GLYCOL 3350 17 G PO PACK
17.0000 g | PACK | Freq: Every day | ORAL | Status: DC
  Administered 2024-02-02: 17 g via ORAL
  Filled 2024-02-02: qty 1

## 2024-02-02 MED ORDER — ONDANSETRON HCL 4 MG PO TABS: 4.0000 mg | ORAL_TABLET | Freq: Four times a day (QID) | ORAL | Status: AC | PRN

## 2024-02-02 MED ORDER — POLYETHYLENE GLYCOL 3350 17 G PO PACK: 17.0000 g | PACK | Freq: Every day | ORAL | Status: AC

## 2024-02-02 NOTE — TOC Progression Note (Addendum)
 Transition of Care [facility]) - Progression Note    Patient Details  Name: Linda Kent MRN: [id number] Date of Birth: [birth year]  [facility] CM/SW Contact  Doneta Glenys DASEN, RN Phone Number: 02/02/2024, 9:14 AM  Clinical Narrative:    11:16 AM [facility] is able to accept patient today if discharged. Provider and nurse updated. 9:14 AM CM called Weigand,Stephen(son), [phone] - left message   Expected Discharge Plan: Skilled Nursing Facility Barriers to Discharge: SNF Pending bed offer               Expected Discharge Plan and Services   Discharge Planning Services: CM Consult   Living arrangements for the past 2 months: Single Family Home                 DME Arranged: N/A DME Agency: NA       HH Arranged: NA HH Agency: NA         Social Drivers of Health (SDOH) Interventions SDOH Screenings   Food Insecurity: No Food Insecurity (01/27/2024)  Housing: Low Risk (01/27/2024)  Transportation Needs: No Transportation Needs (01/27/2024)  Utilities: Not At Risk (01/27/2024)  Depression (PHQ2-9): Low Risk (12/26/2023)  Social Connections: Moderately Isolated (01/27/2024)  Tobacco Use: Low Risk (01/28/2024)    Readmission Risk Interventions    01/31/2024   11:58 AM  Readmission Risk Prevention Plan  Transportation Screening Complete  PCP or Specialist Appt within 5-7 Days Complete  Home Care Screening Complete  Medication Review (RN CM) Complete

## 2024-02-02 NOTE — Plan of Care (Signed)
" °  Problem: Education: Goal: Knowledge of General Education information will improve Description: Including pain rating scale, medication(s)/side effects and non-pharmacologic comfort measures 02/02/2024 0707 by Melvyn Melvyn NOVAK, RN Outcome: Progressing 02/02/2024 0700 by Melvyn Melvyn NOVAK, RN Outcome: Progressing   Problem: Health Behavior/Discharge Planning: Goal: Ability to manage health-related needs will improve 02/02/2024 0707 by Melvyn Melvyn NOVAK, RN Outcome: Progressing 02/02/2024 0700 by Melvyn Melvyn NOVAK, RN Outcome: Progressing   Problem: Clinical Measurements: Goal: Ability to maintain clinical measurements within normal limits will improve Outcome: Progressing   "

## 2024-02-02 NOTE — Discharge Summary (Signed)
 Physician Discharge Summary  Linda Kent FMW:[id number] DOB: 11-13-1945 DOA: 01/26/2024  PCP: Johnny Garnette LABOR, MD  Admit date: 01/26/2024 Discharge date: 02/02/2024 Discharging to: SNF Recommendations for Outpatient Follow-up:  F/u on chronic pain     Discharge Diagnoses:   Principal Problem:   Intractable vomiting likely viral gastroenteritis Active Problems:   Opiate withdrawal (HCC)   Hypothyroidism   ADHD   Chronic pain syndrome   Hypokalemia   Brief hospital course: 79 year old female with chronic myofascial pain who takes oxycodone  and Norco at baseline presented to the hospital with vomiting diarrhea for 4 days.  While in the hospital, she decided not to go back on her full dose of narcotics and therefore had continued GI symptoms related to narcotic withdrawal. CT of the abdomen pelvis was unrevealing. She was started on clear liquid diet and supportive treatment.   Assessment and Plan: Principal Problem:   Intractable vomiting likely viral gastroenteritis - C. difficile negative - May have initially been gastroenteritis but subsequently was related to narcotic withdrawal  - Has resolved   Active Problems:   Opiate withdrawal - Was taking oxycodone  20 mg every 4 hours as needed along with hydrocodone  10 mg every 6 hours as needed and had difficulty with it due to vomiting   Hypokalemia - K was 3.1 and has been replaced - Likely due to GI issues   Elevated alk phos - Has improved from 222-125 and was likely elevated in relation to above-mentioned GI issues   Chronic pain - Current pain is in the left mid back-we discussed trying Voltaren  gel today which I have initiated     Hypothyroidism - Continue Synthroid      ADHD        Discharge Instructions   Allergies as of 02/02/2024       Reactions   Ezetimibe  Other (See Comments)   Difficulty breathing   Atorvastatin  Calcium  [atorvastatin ]    Caused chest pain, labored breathing, fatigue    Crestor   [rosuvastatin  Calcium ] Other (See Comments)   Chest pain, labored breathing   Cephalexin Rash        Medication List     STOP taking these medications    diphenoxylate -atropine  2.5-0.025 MG tablet Commonly known as: LOMOTIL    fluconazole  150 MG tablet Commonly known as: Diflucan    methylphenidate  10 MG tablet Commonly known as: RITALIN        TAKE these medications    ALIGN PO Take 1 capsule by mouth daily.   aspirin  EC 81 MG tablet Take 81 mg by mouth daily.   Calcium  1500 MG tablet Take 1,500 mg by mouth daily. With Vitamin D    denosumab  60 MG/ML Soln injection Commonly known as: PROLIA  Inject 60 mg into the skin every 6 (six) months. Administer in upper arm, thigh, or abdomen   diclofenac  Sodium 1 % Gel Commonly known as: VOLTAREN  Apply 4 g topically 4 (four) times daily.   diphenhydrAMINE  25 mg capsule Commonly known as: BENADRYL  Take 25 mg by mouth every 6 (six) hours as needed.   halobetasol  0.05 % cream Commonly known as: ULTRAVATE  Apply topically 2 (two) times daily. What changed:  how much to take when to take this reasons to take this   HYDROcodone -acetaminophen  10-325 MG tablet Commonly known as: NORCO Take 1 tablet by mouth every 6 (six) hours as needed for moderate pain (pain score 4-6). What changed: Another medication with the same name was removed. Continue taking this medication, and follow the directions you see here.  levothyroxine  75 MCG tablet Commonly known as: SYNTHROID  Take 75 mcg by mouth daily. Brand name only   LORazepam  2 MG tablet Commonly known as: ATIVAN  Take 1 tablet (2 mg total) by mouth every 8 (eight) hours as needed for anxiety.   Magnesium 100 MG Tabs Take 1 tablet by mouth daily at 6 (six) AM. Taking 400 mg daily   meclizine  25 MG tablet Commonly known as: ANTIVERT  Take 1 tablet (25 mg total) by mouth every 4 (four) hours as needed for dizziness.   Melatonin 3 MG Caps Take 9 mg by mouth at bedtime.    metroNIDAZOLE  0.75 % cream Commonly known as: METROCREAM  Apply 1 Application topically daily.   multivitamin tablet Take 1 tablet by mouth daily. Centrum 50 +   NONFORMULARY OR COMPOUNDED ITEM APPLY A DIME SIZED AMOUNT TO AFFECTED AREA 4 TIMES DAILY AS NEEDED.   ondansetron  4 MG tablet Commonly known as: ZOFRAN  Take 1 tablet (4 mg total) by mouth every 6 (six) hours as needed for nausea.   ondansetron  8 MG disintegrating tablet Commonly known as: ZOFRAN -ODT Take 1 tablet (8 mg total) by mouth every 8 (eight) hours as needed for nausea or vomiting.   Oxycodone  HCl 20 MG Tabs Take 1 tablet (20 mg total) by mouth every 4 (four) hours as needed (pain). What changed: Another medication with the same name was removed. Continue taking this medication, and follow the directions you see here.   perphenazine  4 MG tablet Commonly known as: TRILAFON  TAKE 1 TABLET BY MOUTH EVERY 4 HOURS AS NEEDED FOR MIGRAINES   polyethylene glycol 17 g packet Commonly known as: MIRALAX  / GLYCOLAX  Take 17 g by mouth daily. Start taking on: February 03, 2024   Potassium Gluconate 550 MG Tabs Take 1 tablet by mouth daily.   rizatriptan  10 MG tablet Commonly known as: MAXALT  Take 1 tablet (10 mg total) by mouth as needed for migraine. May repeat in 2 hours if needed   topiramate  50 MG tablet Commonly known as: TOPAMAX  Take 3 tablets (150 mg total) by mouth daily.   vitamin C 1000 MG tablet Take 1,000 mg by mouth 2 (two) times daily. Pt taking 5,000 mg   zaleplon  10 MG capsule Commonly known as: SONATA  TAKE ONE CAPSULE BY MOUTH AT BEDTIME AS NEEDED FOR SLEEP        Contact information for after-discharge care     Destination     [facility]   Service: Skilled Nursing Contact information: [address] [place]  [phone]                        The results of significant diagnostics from this hospitalization (including  imaging, microbiology, ancillary and laboratory) are listed below for reference.    CT ABDOMEN PELVIS W CONTRAST Result Date: 01/26/2024 EXAM: CT ABDOMEN AND PELVIS WITH CONTRAST 01/26/2024 05:17:19 PM TECHNIQUE: CT of the abdomen and pelvis was performed with the administration of 100 mL of iohexol  (OMNIPAQUE ) 300 MG/ML solution. Multiplanar reformatted images are provided for review. Automated exposure control, iterative reconstruction, and/or weight-based adjustment of the mA/kV was utilized to reduce the radiation dose to as low as reasonably achievable. COMPARISON: 12/31/2023 CLINICAL HISTORY: Abdominal pain, acute, nonlocalized FINDINGS: LOWER CHEST: Mild elevation of the right hemidiaphragm. Right posterior dependent atelectasis. LIVER: Mild diffuse hepatic steatosis. GALLBLADDER AND BILE DUCTS: Gallbladder is unremarkable. No radiopaque gallstones. No wall thickening. No biliary ductal dilatation. SPLEEN: No  acute abnormality. PANCREAS: Mild atrophy of the pancreatic parenchyma. No mass. ADRENAL GLANDS: No acute abnormality. KIDNEYS, URETERS AND BLADDER: Normal variant retroaortic left renal vein. No stones in the kidneys or ureters. No hydronephrosis. No perinephric or periureteral stranding. Urinary bladder is unremarkable. GI AND BOWEL: Stomach demonstrates no acute abnormality. There is no bowel obstruction. No right lower quadrant or pericecal inflammatory changes to suggest acute appendicitis. PERITONEUM AND RETROPERITONEUM: No ascites. No free air. No free pelvic fluid. VASCULATURE: Aorta is normal in caliber. Scattered aortoiliac atherosclerosis. LYMPH NODES: No lymphadenopathy. REPRODUCTIVE ORGANS: Hysterectomy. Unchanged 3.8 cm left ovarian cyst. BONES AND SOFT TISSUES: Multilevel thoracic osteophytosis. No acute osseous abnormality. No focal soft tissue abnormality. IMPRESSION: 1. No acute findings in the abdomen or pelvis. 2. Unchanged left ovarian cyst measuring 3.7 cm. 3. Mild diffuse hepatic  steatosis. Electronically signed by: Rogelia Myers MD MD 01/26/2024 05:45 PM EST RP Workstation: [id number]   Labs:   Basic Metabolic Panel: Recent Labs  Lab 01/26/24 1608 01/26/24 1629 01/27/24 0330 01/28/24 0432 01/28/24 0855 01/29/24 0438 02/01/24 0822  NA 143 143 142 141  --   --  138  K 3.6 3.6 3.2* 3.1*  --  4.2 3.5  CL 106 109 108 108  --   --  105  CO2 21*  --  21* 24  --   --  22  GLUCOSE 116* 110* 100* 125*  --   --  110*  BUN 22 24* 18 8  --   --  14  CREATININE 0.70 0.70 0.53 0.57  --   --  0.67  CALCIUM  9.2  --  8.0* 8.1*  --   --  8.9  MG  --   --   --   --  2.1  --   --      CBC: Recent Labs  Lab 01/26/24 1608 01/26/24 1629 01/27/24 0103 01/28/24 0432 02/01/24 0441  WBC 9.7  --  9.2 6.8 7.9  NEUTROABS 7.8*  --   --   --   --   HGB 14.6 15.3* 13.1 12.0 13.9  HCT 44.2 45.0 39.3 36.6 42.1  MCV 97.1  --  98.0 97.3 98.8  PLT 312  --  256 214 242         SIGNED:   True Atlas, MD  [facility] 02/02/2024, 11:56 AM Time taking on discharge: 50 minutes

## 2024-02-02 NOTE — TOC Transition Note (Signed)
 Transition of Care [facility]) - Discharge Note   Patient Details  Name: Linda Kent MRN: [id number] Date of Birth: 10-17-1945  [facility] CM/SW Contact:  Doneta Glenys DASEN, RN Phone Number: 02/02/2024, 12:05 PM   Clinical Narrative:    Per MD patient ready for DC to [facility]. RN to call report prior to discharge [phone] Rm [phone]). RN, patient, patient's family, and facility notified of DC. Discharge Summary and FL2 sent to facility via HUB. DC packet on chart face sheet, medical necessity, signed prescription. Ambulance [facility] transport requested for patient.   CM sign off. Please consult us  again if new needs arise.     Final next level of care: Skilled Nursing Facility Barriers to Discharge: No Barriers Identified   Patient Goals and CMS Choice Patient states their goals for this hospitalization and ongoing recovery are:: [facility] CMS Medicare.gov Compare Post Acute Care list provided to:: Patient Choice offered to / list presented to : Patient [place] ownership interest in [facility].provided to:: Patient    Discharge Placement   Existing PASRR number confirmed : 02/02/24          Patient chooses bed at: [facility] [place] Patient to be transferred to facility by: [facility] Name of family member notified: Garnette Patient and family notified of of transfer: 02/02/24  Discharge Plan and Services Additional resources added to the After Visit Summary for     Discharge Planning Services: CM Consult            DME Arranged: N/A DME Agency: NA       HH Arranged: NA HH Agency: NA        Social Drivers of Health (SDOH) Interventions SDOH Screenings   Food Insecurity: No Food Insecurity (01/27/2024)  Housing: Low Risk (01/27/2024)  Transportation Needs: No Transportation Needs (01/27/2024)  Utilities: Not At Risk (01/27/2024)  Depression (PHQ2-9): Low Risk (12/26/2023)  Social Connections: Moderately Isolated  (01/27/2024)  Tobacco Use: Low Risk (01/28/2024)     Readmission Risk Interventions    01/31/2024   11:58 AM  Readmission Risk Prevention Plan  Transportation Screening Complete  PCP or Specialist Appt within 5-7 Days Complete  Home Care Screening Complete  Medication Review (RN CM) Complete

## 2024-02-02 NOTE — Progress Notes (Signed)
 Report Called to [facility].  AVS, Rx, and forms in discharge packet.

## 2024-02-02 NOTE — Plan of Care (Signed)
   Problem: Education: Goal: Knowledge of General Education information will improve Description Including pain rating scale, medication(s)/side effects and non-pharmacologic comfort measures Outcome: Progressing   Problem: Health Behavior/Discharge Planning: Goal: Ability to manage health-related needs will improve Outcome: Progressing

## 2024-02-02 NOTE — [place] FL2 (Signed)
 " [place]  MEDICAID FL2 LEVEL OF CARE FORM     IDENTIFICATION  Patient Name: Linda Kent Birthdate: 03/10/1945 Sex: female Admission Date (Current Location): 01/26/2024  [facility] and [place] Number:  Producer, Television/film/video and Address:  [facility],  [address], [place] [address]      Provider Number:    Attending Physician Name and Address:  Earley Saucer, MD  Relative Name and Phone Number:       Current Level of Care: Hospital Recommended Level of Care: Skilled Nursing Facility Prior Approval Number:    Date Approved/Denied: 01/31/24 PASRR Number: [id number]  Discharge Plan: SNF    Current Diagnoses: Patient Active Problem List   Diagnosis Date Noted   Opiate withdrawal (HCC) 01/30/2024   Hypokalemia 01/28/2024   Chronic pain syndrome 01/27/2024   Intractable vomiting likely viral gastroenteritis 01/26/2024   Statin intolerance 08/30/2017   [place] cardiac risk 10-20% in next 10 years 08/30/2017   ADHD 06/14/2017   Myofascial pain syndrome 07/17/2012   MIXED HYPERLIPIDEMIA 11/25/2009   LEUKOPENIA, MILD 11/25/2009   IRRITABLE BOWEL SYNDROME 11/25/2009   Hypothyroidism 10/29/2008   Anxiety state 06/20/2008   ARTHRITIS 04/16/2008   Migraine headache 09/26/2007   ALLERGIC RHINITIS 09/26/2007   ROSACEA 09/26/2007   Osteoporosis 09/26/2007   INSOMNIA, CHRONIC 09/22/2006   POSTMENOPAUSAL STATUS 09/22/2006   Depression 09/22/2006   Headache 09/15/2006    Orientation RESPIRATION BLADDER Height & Weight     Self, Situation, Place, Time  Normal External catheter, Incontinent Weight: 52.2 kg Height:  5' 4 (162.6 cm)  BEHAVIORAL SYMPTOMS/MOOD NEUROLOGICAL BOWEL NUTRITION STATUS      Continent Diet  AMBULATORY STATUS COMMUNICATION OF NEEDS Skin   Limited Assist Verbally Normal                       Personal Care Assistance Level of Assistance  Bathing, Feeding, Dressing Bathing Assistance: Limited assistance Feeding assistance:  Independent Dressing Assistance: Limited assistance     Functional Limitations Info  Sight Sight Info: Impaired        SPECIAL CARE FACTORS FREQUENCY  PT (By licensed PT), OT (By licensed OT)     PT Frequency: 5x weekly OT Frequency: 5x weekly            Contractures Contractures Info: Not present    Additional Factors Info  Code Status, Allergies Code Status Info: FULL Allergies Info: Atorvastatin , Crestor ,Cephalexin, Ezetimibe            Current Medications (02/02/2024):  This is the current hospital active medication list Current Facility-Administered Medications  Medication Dose Route Frequency Provider Last Rate Last Admin   albuterol  (PROVENTIL ) (2.5 MG/3ML) 0.083% nebulizer solution 2.5 mg  2.5 mg Nebulization Q2H PRN Zella, Mir M, MD       aspirin  EC tablet 81 mg  81 mg Oral Daily Ikramullah, Mir M, MD   81 mg at 02/02/24 9047   diclofenac  Sodium (VOLTAREN ) 1 % topical gel 4 g  4 g Topical QID Rizwan, Saima, MD   4 g at 02/01/24 1740   diphenoxylate -atropine  (LOMOTIL ) 2.5-0.025 MG per tablet 1 tablet  1 tablet Oral QID PRN Danford, Lonni SQUIBB, MD       enoxaparin  (LOVENOX ) injection 40 mg  40 mg Subcutaneous Q24H Zella, Mir M, MD   40 mg at 02/02/24 0951   feeding supplement (ENSURE PLUS HIGH PROTEIN) liquid 237 mL  237 mL Oral BID BM Rizwan, Saima, MD   237  mL at 02/02/24 1007   HYDROcodone -acetaminophen  (NORCO) 10-325 MG per tablet 1 tablet  1 tablet Oral Q6H PRN Jonel Lonni SQUIBB, MD   1 tablet at 01/30/24 9094   levothyroxine  (SYNTHROID ) tablet 75 mcg  75 mcg Oral Q0600 Zella Katha HERO, MD   75 mcg at 02/02/24 9355   loperamide  (IMODIUM ) capsule 2 mg  2 mg Oral PRN Danford, Christopher P, MD   2 mg at 01/27/24 1622   LORazepam  (ATIVAN ) tablet 2 mg  2 mg Oral BID PRN Jonel Lonni SQUIBB, MD   2 mg at 02/01/24 0800   meclizine  (ANTIVERT ) tablet 25 mg  25 mg Oral TID PRN Zella Katha HERO, MD   25 mg at 01/31/24 1331   ondansetron  (ZOFRAN )  tablet 4 mg  4 mg Oral Q6H PRN Zella Katha HERO, MD   4 mg at 02/02/24 1059   Or   ondansetron  (ZOFRAN ) injection 4 mg  4 mg Intravenous Q6H PRN Zella Katha HERO, MD   4 mg at 01/30/24 9386   oxyCODONE  (Oxy IR/ROXICODONE ) immediate release tablet 20 mg  20 mg Oral Q4H PRN Zella, Mir M, MD   20 mg at 02/02/24 1002   polyethylene glycol (MIRALAX  / GLYCOLAX ) packet 17 g  17 g Oral Daily Rizwan, Saima, MD   17 g at 02/02/24 1105   SUMAtriptan  (IMITREX ) tablet 50 mg  50 mg Oral Q2H PRN Zella Katha HERO, MD   50 mg at 01/30/24 0440   topiramate  (TOPAMAX ) tablet 150 mg  150 mg Oral Daily Ikramullah, Mir M, MD   150 mg at 02/02/24 9048   zolpidem  (AMBIEN ) tablet 5 mg  5 mg Oral QHS PRN Zella Katha HERO, MD   5 mg at 01/28/24 2201     Discharge Medications: Please see discharge summary for a list of discharge medications.  Relevant Imaging Results:  Relevant Lab Results:   Additional Information SSN  [id number]  Doneta Glenys DASEN, RN     "

## 2024-02-09 ENCOUNTER — Telehealth: Payer: Self-pay

## 2024-02-09 ENCOUNTER — Other Ambulatory Visit: Payer: Self-pay | Admitting: Family Medicine

## 2024-02-09 NOTE — Transitions of Care (Post Inpatient/ED Visit) (Unsigned)
" ° °  02/09/2024  Name: Linda Kent MRN: [id number] DOB: 03-02-1945  Today's TOC FU Call Status: Today's TOC FU Call Status:: Unsuccessful Call (1st Attempt) Unsuccessful Call (1st Attempt) Date: 02/09/24  Attempted to reach the patient regarding the most recent Inpatient/ED visit.  Follow Up Plan: Additional outreach attempts will be made to reach the patient to complete the Transitions of Care (Post Inpatient/ED visit) call.   Signature Julian Lemmings, LPN [facility] Nurse Health Advisor Direct Dial [phone]  "

## 2024-02-10 NOTE — Transitions of Care (Post Inpatient/ED Visit) (Signed)
 "  02/10/2024  Name: Linda Kent MRN: [id number] DOB: 12-31-1945  Today's TOC FU Call Status: Today's TOC FU Call Status:: Successful TOC FU Call Completed Unsuccessful Call (1st Attempt) Date: 02/09/24 [facility] FU Call Complete Date: 02/10/24  Patient's Name and Date of Birth confirmed. Name, DOB  Transition Care Management Follow-up Telephone Call Date of Discharge: 02/08/24 Discharge Facility: Other ([facility]) Name of Other ([facility]) Discharge Facility: [facility] Type of Discharge: Inpatient Admission Primary Inpatient Discharge Diagnosis:: vomiting How have you been since you were released from the hospital?: Same Any questions or concerns?: Yes Patient Questions/Concerns:: patient repeatedly states she wants to die, and wants Dr. Johnny to understand this. She states she is wore out and tire of being sick. Her husband is there at home with her  Items Reviewed: Did you receive and understand the discharge instructions provided?: Yes Medications obtained,verified, and reconciled?: Yes (Medications Reviewed) Any new allergies since your discharge?: No Dietary orders reviewed?: Yes Do you have support at home?: Yes People in Home [RPT]: spouse  Medications Reviewed Today: Medications Reviewed Today     Reviewed by Emmitt Pan, LPN (Licensed Practical Nurse) on 02/10/24 at 1027  Med List Status: <None>   Medication Order Taking? Sig Documenting Provider Last Dose Status Informant  Ascorbic Acid (VITAMIN C) 1000 MG tablet [id number] Yes Take 1,000 mg by mouth 2 (two) times daily. Pt taking 5,000 mg [provider]  Active Self  aspirin  81 MG EC tablet [id number] Yes Take 81 mg by mouth daily. [provider]  Active Self  Calcium  1500 MG tablet [id number] Yes Take 1,500 mg by mouth daily. With Vitamin D  [provider]  Active Self  denosumab  (PROLIA ) 60 MG/ML SOLN injection [id number] Yes Inject 60 mg into the skin every 6 (six) months. Administer in  upper arm, thigh, or abdomen [provider]  Active Self           Med Note (SATTERFIELD, TEENA E   Fri Jan 27, 2024  5:18 PM) Patient states dose due due this month however she is not sure of what date   diclofenac  Sodium (VOLTAREN ) 1 % GEL [id number] Yes Apply 4 g topically 4 (four) times daily. Rizwan, Saima, MD  Active   diphenhydrAMINE  (BENADRYL ) 25 mg capsule [id number] Yes Take 25 mg by mouth every 6 (six) hours as needed. [provider]  Active Self  halobetasol  (ULTRAVATE ) 0.05 % cream [id number] Yes Apply topically 2 (two) times daily.  Patient taking differently: Apply 1 Application topically 2 (two) times daily as needed (for rash).   Johnny Garnette LABOR, MD  Active Self  HYDROcodone -acetaminophen  [facility]) 10-325 MG tablet [id number] Yes Take 1 tablet by mouth every 6 (six) hours as needed for moderate pain (pain score 4-6). Rizwan, Saima, MD  Active   levothyroxine  (SYNTHROID ) 75 MCG tablet [id number] Yes Take 75 mcg by mouth daily. Brand name only [provider]  Active Self  LORazepam  (ATIVAN ) 2 MG tablet [id number] Yes Take 1 tablet (2 mg total) by mouth every 8 (eight) hours as needed for anxiety. Rizwan, Saima, MD  Active   Magnesium 100 MG TABS [id number] Yes Take 1 tablet by mouth daily at 6 (six) AM. Taking 400 mg daily [provider]  Active Self  meclizine  (ANTIVERT ) 25 MG tablet [id number] Yes Take 1 tablet (25 mg total) by mouth every 4 (four) hours as needed for dizziness. Johnny Garnette LABOR, MD  Active Self  Melatonin 3 MG CAPS [id number] Yes Take 9  mg by mouth at bedtime. [provider]  Active Self  metroNIDAZOLE  (METROCREAM ) 0.75 % cream [id number] Yes Apply 1 Application topically daily. Johnny Garnette LABOR, MD  Active Self  Multiple Vitamin (MULTIVITAMIN) tablet [id number] Yes Take 1 tablet by mouth daily. Centrum 50 + [provider]  Active Self  NONFORMULARY OR COMPOUNDED ITEM [id number] Yes APPLY A DIME SIZED AMOUNT TO AFFECTED AREA 4  TIMES DAILY AS NEEDED. Johnny Garnette LABOR, MD  Active Self  ondansetron  (ZOFRAN ) 4 MG tablet [id number] Yes Take 1 tablet (4 mg total) by mouth every 6 (six) hours as needed for nausea. Rizwan, Saima, MD  Active   ondansetron  (ZOFRAN -ODT) 8 MG disintegrating tablet [id number] Yes Take 1 tablet (8 mg total) by mouth every 8 (eight) hours as needed for nausea or vomiting. Dasie Faden, MD  Active   Oxycodone  HCl 20 MG TABS [id number] Yes Take 1 tablet (20 mg total) by mouth every 4 (four) hours as needed (pain). Rizwan, Saima, MD  Active   perphenazine  (TRILAFON ) 4 MG tablet [id number] Yes TAKE 1 TABLET BY MOUTH EVERY 4 HOURS AS NEEDED FOR MIGRAINES Theophilus Andrews, Tully GRADE, MD  Active Self  polyethylene glycol (MIRALAX  / GLYCOLAX ) 17 g packet [id number] Yes Take 17 g by mouth daily. Rizwan, Saima, MD  Active   Potassium Gluconate 550 MG TABS [id number] Yes Take 1 tablet by mouth daily. [provider]  Active Self  Probiotic Product (ALIGN PO) [id number] Yes Take 1 capsule by mouth daily.  [provider]  Active Self  rizatriptan  (MAXALT ) 10 MG tablet [id number] Yes Take 1 tablet (10 mg total) by mouth as needed for migraine. May repeat in 2 hours if needed Johnny Garnette LABOR, MD  Active Self  topiramate  (TOPAMAX ) 50 MG tablet [id number] Yes Take 3 tablets (150 mg total) by mouth daily. Johnny Garnette LABOR, MD  Active   zaleplon  (SONATA ) 10 MG capsule [id number] Yes Take 1 capsule (10 mg total) by mouth at bedtime as needed. for sleep Rizwan, Saima, MD  Active             Home Care and Equipment/Supplies: Were Home Health Services Ordered?: Yes Name of Home Health Agency:: unknown Has Agency set up a time to come to your home?: No (patient declined) Any new equipment or medical supplies ordered?: NA  Functional Questionnaire: Do you need assistance with bathing/showering or dressing?: Yes Do you need assistance with meal preparation?: Yes Do you need assistance with eating?: No Do you have  difficulty maintaining continence: No Do you need assistance with getting out of bed/getting out of a chair/moving?: No Do you have difficulty managing or taking your medications?: Yes  Follow up appointments reviewed: PCP Follow-up appointment confirmed?: Yes Date of PCP follow-up appointment?: 02/15/24 Follow-up Provider: [facility] Follow-up appointment confirmed?: NA Do you need transportation to your follow-up appointment?: No Do you understand care options if your condition(s) worsen?: Yes-patient verbalized understanding    SIGNATURE Julian Lemmings, LPN [facility] Nurse Health Advisor Direct Dial [phone]  "

## 2024-02-15 ENCOUNTER — Inpatient Hospital Stay: Admitting: Family Medicine

## 2024-02-17 ENCOUNTER — Inpatient Hospital Stay: Admitting: Family Medicine

## 2024-02-27 ENCOUNTER — Inpatient Hospital Stay: Admitting: Family Medicine
